# Patient Record
Sex: Male | Born: 1970 | Race: White | Hispanic: No | Marital: Single
Health system: Southern US, Academic
[De-identification: ages and names within clinical notes are randomized; demographics above are authoritative.]

## PROBLEM LIST (undated history)

## (undated) ENCOUNTER — Encounter

## (undated) ENCOUNTER — Ambulatory Visit: Payer: PRIVATE HEALTH INSURANCE

## (undated) ENCOUNTER — Telehealth

## (undated) ENCOUNTER — Ambulatory Visit

## (undated) ENCOUNTER — Ambulatory Visit
Payer: Medicaid (Managed Care) | Attending: Student in an Organized Health Care Education/Training Program | Primary: Student in an Organized Health Care Education/Training Program

## (undated) ENCOUNTER — Encounter
Attending: Student in an Organized Health Care Education/Training Program | Primary: Student in an Organized Health Care Education/Training Program

## (undated) ENCOUNTER — Ambulatory Visit: Payer: Medicaid (Managed Care)

## (undated) ENCOUNTER — Ambulatory Visit: Payer: MEDICAID

## (undated) ENCOUNTER — Encounter
Attending: Pharmacist Clinician (PhC)/ Clinical Pharmacy Specialist | Primary: Pharmacist Clinician (PhC)/ Clinical Pharmacy Specialist

## (undated) ENCOUNTER — Encounter: Attending: Infectious Disease | Primary: Infectious Disease

## (undated) ENCOUNTER — Ambulatory Visit: Attending: Infectious Disease | Primary: Infectious Disease

## (undated) DIAGNOSIS — I10 Essential (primary) hypertension: Secondary | ICD-10-CM

## (undated) DIAGNOSIS — B20 Human immunodeficiency virus [HIV] disease: Secondary | ICD-10-CM

## (undated) DIAGNOSIS — R002 Palpitations: Secondary | ICD-10-CM

## (undated) MED ORDER — FLUTICASONE PROPIONATE 50 MCG/ACTUATION NASAL SPRAY,SUSPENSION
Freq: Every day | NASAL | 0 days
Start: ? — End: 2020-01-05

---
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
## 2014-12-16 ENCOUNTER — Other Ambulatory Visit: Payer: Self-pay

## 2014-12-16 ENCOUNTER — Emergency Department
Admission: EM | Admit: 2014-12-16 | Discharge: 2014-12-16 | Disposition: A | Discharge status: Home / Self Care | Payer: Managed Care, Other (non HMO) | Attending: Emergency Medicine | Admitting: Emergency Medicine

## 2014-12-16 ENCOUNTER — Encounter: Payer: Self-pay | Admitting: Emergency Medicine

## 2014-12-16 DIAGNOSIS — Z720 Tobacco use: Secondary | ICD-10-CM | POA: Diagnosis not present

## 2014-12-16 DIAGNOSIS — R079 Chest pain, unspecified: Secondary | ICD-10-CM | POA: Diagnosis present

## 2014-12-16 DIAGNOSIS — Z21 Asymptomatic human immunodeficiency virus [HIV] infection status: Secondary | ICD-10-CM | POA: Insufficient documentation

## 2014-12-16 DIAGNOSIS — F419 Anxiety disorder, unspecified: Secondary | ICD-10-CM

## 2014-12-16 HISTORY — DX: Human immunodeficiency virus (HIV) disease: B20

## 2014-12-16 LAB — COMPREHENSIVE METABOLIC PANEL
ALBUMIN: 4.5 g/dL (ref 3.5–5.0)
ALT: 19 U/L (ref 17–63)
ANION GAP: 8 (ref 5–15)
AST: 27 U/L (ref 15–41)
Alkaline Phosphatase: 65 U/L (ref 38–126)
BILIRUBIN TOTAL: 0.6 mg/dL (ref 0.3–1.2)
BUN: 10 mg/dL (ref 6–20)
CHLORIDE: 103 mmol/L (ref 101–111)
CO2: 25 mmol/L (ref 22–32)
Calcium: 9.1 mg/dL (ref 8.9–10.3)
Creatinine, Ser: 1.28 mg/dL — ABNORMAL HIGH (ref 0.61–1.24)
Glucose, Bld: 105 mg/dL — ABNORMAL HIGH (ref 65–99)
Potassium: 4.1 mmol/L (ref 3.5–5.1)
SODIUM: 136 mmol/L (ref 135–145)
Total Protein: 7.7 g/dL (ref 6.5–8.1)

## 2014-12-16 LAB — CBC
HEMATOCRIT: 42.3 % (ref 40.0–52.0)
HEMOGLOBIN: 14.3 g/dL (ref 13.0–18.0)
MCH: 32 pg (ref 26.0–34.0)
MCHC: 33.8 g/dL (ref 32.0–36.0)
MCV: 94.7 fL (ref 80.0–100.0)
PLATELETS: 172 10*3/uL (ref 150–440)
RBC: 4.47 MIL/uL (ref 4.40–5.90)
RDW: 12.3 % (ref 11.5–14.5)
WBC: 5.7 10*3/uL (ref 3.8–10.6)

## 2014-12-16 LAB — TROPONIN I: Troponin I: <0.03 ng/mL (ref <0.031)

## 2014-12-16 MED ORDER — LORAZEPAM 0.5 MG PO TABS: 0.5000 mg | ORAL_TABLET | Freq: Four times a day (QID) | ORAL | Status: AC | PRN

## 2014-12-16 NOTE — ED Provider Notes (Signed)
[facility] Emergency Department Provider Note  Time seen: 5:36 PM  I have reviewed the triage vital signs and the nursing notes.   HISTORY  Chief Complaint Chest Pain    HPI Jose George is a 44 y.o. male with a past medical history of HIV (currently undetectable viral load) who presents the emergency department with approximately 24 hours of intermittent chest pain, blurred vision. According to the patient the symptoms began last night all he was at work. He noted feeling dizzy with some blurred vision, and then noted some dull chest pain. Patient states symptoms lasted approximately 20-30 minutes and then resolved. This morning patient had a similar episode again, again the symptoms resolved. This afternoon he had an additional episode so he came to the emergency department for evaluation. The patient states this has happened to him before in the past, which he has attributed more to stress/anxiety. He denies any chest pain or symptoms currently in the emergency department. He states his symptoms are mild when they do occur, but they were concerning to him so he came for evaluation. Denies any shortness of breath, nausea, diaphoresis. He cannot identify any modifying factors.    Past Medical History  Diagnosis Date  . HIV (human immunodeficiency virus infection)     There are no active problems to display for this patient.   History reviewed. No pertinent past surgical history.  No current outpatient prescriptions on file.  Allergies Review of patient's allergies indicates no known allergies.  No family history on file.  Social History History  Substance Use Topics  . Smoking status: Current Every Day Smoker -- 0.10 packs/day    Types: Cigarettes  . Smokeless tobacco: Not on file  . Alcohol Use: No    Review of Systems Constitutional: Negative for fever. Eyes: Intermittent blurred vision. Cardiovascular: Mild chest pain. Respiratory:  Negative for shortness of breath. Gastrointestinal: Negative for abdominal pain, vomiting and diarrhea. Musculoskeletal: Negative for back pain. Skin: Negative for rash. Neurological: Negative for headache  10-point ROS otherwise negative.  ____________________________________________   PHYSICAL EXAM:  VITAL SIGNS: ED Triage Vitals  Enc Vitals Group     BP 12/16/14 1511 119/63 mmHg     Pulse Rate 12/16/14 1511 86     Resp 12/16/14 1511 20     Temp 12/16/14 1511 98.5 F (36.9 C)     Temp Source 12/16/14 1511 Oral     SpO2 12/16/14 1511 95 %     Weight 12/16/14 1511 175 lb (79.379 kg)     Height 12/16/14 1511 5\' 11"  (1.803 m)     Head Cir --      Peak Flow --      Pain Score 12/16/14 1512 3     Pain Loc --      Pain Edu? --      Excl. in GC? --     Constitutional: Alert and oriented. Well appearing Eyes: Normal exam, 2 mm PERRL bilaterally ENT   Head: Normocephalic and atraumatic.   Mouth/Throat: Mucous membranes are moist. Cardiovascular: Normal rate, regular rhythm. No murmurs, rubs, or gallops. Respiratory: Normal respiratory effort without tachypnea nor retractions. Breath sounds are clear  Gastrointestinal: Soft and nontender. No distention.   Musculoskeletal: Nontender with normal range of motion in all extremities. No lower extremity tenderness or edema. Neurologic:  Normal speech and language. No gross focal neurologic deficits  Skin:  Skin is warm, dry and intact.  Psychiatric: Mood and affect are normal. Speech and  behavior are normal.   ____________________________________________    EKG  EKG reviewed and interpreted by myself shows normal sinus rhythm at 79 bpm, narrow QRS, normal axis, normal intervals, no ST changes noted. Overall normal EKG.  ____________________________________________    INITIAL IMPRESSION / ASSESSMENT AND PLAN / ED COURSE  Pertinent labs & imaging results that were available during my care of the patient were reviewed  by me and considered in my medical decision making (see chart for details).  Labs and EKG are normal. Patient denies any symptoms at this time. Patient's symptoms possibly due to stress/anxiety. I have offered the patient a head CT for further evaluation, the patient wishes to hold off for now. We will do a trial of Ativan at home as needed for his symptoms. He states if symptoms do not resolve with Ativan use, or they recur/worsen over the weekend he will return to the emergency department for further evaluation, otherwise he'll follow-up with his primary care doctor this week.  ____________________________________________   FINAL CLINICAL IMPRESSION(S) / ED DIAGNOSES  Chest pain   Minna Antis, MD 12/16/14 1740

## 2014-12-16 NOTE — Discharge Instructions (Signed)
Chest Pain (Nonspecific) °It is often hard to give a specific diagnosis for the cause of chest pain. There is always a chance that your pain could be related to something serious, such as a heart attack or a blood clot in the lungs. You need to follow up with your health care provider for further evaluation. °CAUSES  °· Heartburn. °· Pneumonia or bronchitis. °· Anxiety or stress. °· Inflammation around your heart (pericarditis) or lung (pleuritis or pleurisy). °· A blood clot in the lung. °· A collapsed lung (pneumothorax). It can develop suddenly on its own (spontaneous pneumothorax) or from trauma to the chest. °· Shingles infection (herpes zoster virus). °The chest wall is composed of bones, muscles, and cartilage. Any of these can be the source of the pain. °· The bones can be bruised by injury. °· The muscles or cartilage can be strained by coughing or overwork. °· The cartilage can be affected by inflammation and become sore (costochondritis). °DIAGNOSIS  °Lab tests or other studies may be needed to find the cause of your pain. Your health care provider may have you take a test called an ambulatory electrocardiogram (ECG). An ECG records your heartbeat patterns over a 24-hour period. You may also have other tests, such as: °· Transthoracic echocardiogram (TTE). During echocardiography, sound waves are used to evaluate how blood flows through your heart. °· Transesophageal echocardiogram (TEE). °· Cardiac monitoring. This allows your health care provider to monitor your heart rate and rhythm in real time. °· Holter monitor. This is a portable device that records your heartbeat and can help diagnose heart arrhythmias. It allows your health care provider to track your heart activity for several days, if needed. °· Stress tests by exercise or by giving medicine that makes the heart beat faster. °TREATMENT  °· Treatment depends on what may be causing your chest pain. Treatment may include: °· Acid blockers for  heartburn. °· Anti-inflammatory medicine. °· Pain medicine for inflammatory conditions. °· Antibiotics if an infection is present. °· You may be advised to change lifestyle habits. This includes stopping smoking and avoiding alcohol, caffeine, and chocolate. °· You may be advised to keep your head raised (elevated) when sleeping. This reduces the chance of acid going backward from your stomach into your esophagus. °Most of the time, nonspecific chest pain will improve within 2-3 days with rest and mild pain medicine.  °HOME CARE INSTRUCTIONS  °· If antibiotics were prescribed, take them as directed. Finish them even if you start to feel better. °· For the next few days, avoid physical activities that bring on chest pain. Continue physical activities as directed. °· Do not use any tobacco products, including cigarettes, chewing tobacco, or electronic cigarettes. °· Avoid drinking alcohol. °· Only take medicine as directed by your health care provider. °· Follow your health care provider's suggestions for further testing if your chest pain does not go away. °· Keep any follow-up appointments you made. If you do not go to an appointment, you could develop lasting (chronic) problems with pain. If there is any problem keeping an appointment, call to reschedule. °SEEK MEDICAL CARE IF:  °· Your chest pain does not go away, even after treatment. °· You have a rash with blisters on your chest. °· You have a fever. °SEEK IMMEDIATE MEDICAL CARE IF:  °· You have increased chest pain or pain that spreads to your arm, neck, jaw, back, or abdomen. °· You have shortness of breath. °· You have an increasing cough, or you cough   up blood.  You have severe back or abdominal pain.  You feel nauseous or vomit.  You have severe weakness.  You faint.  You have chills. This is an emergency. Do not wait to see if the pain will go away. Get medical help at once. Call your local emergency services (911 in [place] Do not drive  yourself to the hospital. MAKE SURE YOU:   Understand these instructions.  Will watch your condition.  Will get help right away if you are not doing well or get worse. Document Released: 04/02/2005 Document Revised: 06/28/2013 Document Reviewed: 01/27/2008 [facility] Patient Information 2015 [place], [place]. This information is not intended to replace advice given to you by your health care provider. Make sure you discuss any questions you have with your health care provider.  Panic Attacks Panic attacks are sudden, short-livedsurges of severe anxiety, fear, or discomfort. They may occur for no reason when you are relaxed, when you are anxious, or when you are sleeping. Panic attacks may occur for a number of reasons:   Healthy people occasionally have panic attacks in extreme, life-threatening situations, such as war or natural disasters. Normal anxiety is a protective mechanism of the body that helps [place] react to danger (fight or flight response).  Panic attacks are often seen with anxiety disorders, such as panic disorder, social anxiety disorder, generalized anxiety disorder, and phobias. Anxiety disorders cause excessive or uncontrollable anxiety. They may interfere with your relationships or other life activities.  Panic attacks are sometimes seen with other mental illnesses, such as depression and posttraumatic stress disorder.  Certain medical conditions, prescription medicines, and drugs of abuse can cause panic attacks. SYMPTOMS  Panic attacks start suddenly, peak within 20 minutes, and are accompanied by four or more of the following symptoms:  Pounding heart or fast heart rate (palpitations).  Sweating.  Trembling or shaking.  Shortness of breath or feeling smothered.  Feeling choked.  Chest pain or discomfort.  Nausea or strange feeling in your stomach.  Dizziness, light-headedness, or feeling like you will faint.  Chills or hot flushes.  Numbness or tingling in  your lips or hands and feet.  Feeling that things are not real or feeling that you are not yourself.  Fear of losing control or going crazy.  Fear of dying. Some of these symptoms can mimic serious medical conditions. For example, you may think you are having a heart attack. Although panic attacks can be very scary, they are not life threatening. DIAGNOSIS  Panic attacks are diagnosed through an assessment by your health care provider. Your health care provider will ask questions about your symptoms, such as where and when they occurred. Your health care provider will also ask about your medical history and use of alcohol and drugs, including prescription medicines. Your health care provider may order blood tests or other studies to rule out a serious medical condition. Your health care provider may refer you to a mental health professional for further evaluation. TREATMENT   Most healthy people who have one or two panic attacks in an extreme, life-threatening situation will not require treatment.  The treatment for panic attacks associated with anxiety disorders or other mental illness typically involves counseling with a mental health professional, medicine, or a combination of both. Your health care provider will help determine what treatment is best for you.  Panic attacks due to physical illness usually go away with treatment of the illness. If prescription medicine is causing panic attacks, talk with your health care  provider about stopping the medicine, decreasing the dose, or substituting another medicine.  Panic attacks due to alcohol or drug abuse go away with abstinence. Some adults need professional help in order to stop drinking or using drugs. HOME CARE INSTRUCTIONS   Take all medicines as directed by your health care provider.   Schedule and attend follow-up visits as directed by your health care provider. It is important to keep all your appointments. SEEK MEDICAL CARE  IF:  You are not able to take your medicines as prescribed.  Your symptoms do not improve or get worse. SEEK IMMEDIATE MEDICAL CARE IF:   You experience panic attack symptoms that are different than your usual symptoms.  You have serious thoughts about hurting yourself or others.  You are taking medicine for panic attacks and have a serious side effect. MAKE SURE YOU:  Understand these instructions.  Will watch your condition.  Will get help right away if you are not doing well or get worse. Document Released: 06/23/2005 Document Revised: 06/28/2013 Document Reviewed: 02/04/2013 [facility] Patient Information 2015 [place], [place]. This information is not intended to replace advice given to you by your health care provider. Make sure you discuss any questions you have with your health care provider.     You have been seen in the emergency department for intermittent blurred vision and chest pain. As we have discussed your workup today is largely normal. Please follow-up with your primary care doctor says possible regarding today's emergency department visit any current symptoms. Return to the emergency department for any worsening symptoms, or any other personally concerning symptoms.

## 2014-12-16 NOTE — ED Notes (Signed)
Felt chest pain last night, relieved after rest, today again chest pain and blurred vision, L sided.

## 2016-09-04 ENCOUNTER — Emergency Department: Payer: Managed Care, Other (non HMO)

## 2016-09-04 ENCOUNTER — Emergency Department
Admission: EM | Admit: 2016-09-04 | Discharge: 2016-09-04 | Disposition: A | Discharge status: Home / Self Care | Payer: Managed Care, Other (non HMO) | Attending: Emergency Medicine | Admitting: Emergency Medicine

## 2016-09-04 DIAGNOSIS — F17210 Nicotine dependence, cigarettes, uncomplicated: Secondary | ICD-10-CM | POA: Insufficient documentation

## 2016-09-04 DIAGNOSIS — Z21 Asymptomatic human immunodeficiency virus [HIV] infection status: Secondary | ICD-10-CM | POA: Insufficient documentation

## 2016-09-04 DIAGNOSIS — R519 Headache, unspecified: Secondary | ICD-10-CM

## 2016-09-04 DIAGNOSIS — R51 Headache: Secondary | ICD-10-CM | POA: Insufficient documentation

## 2016-09-04 DIAGNOSIS — H538 Other visual disturbances: Secondary | ICD-10-CM | POA: Insufficient documentation

## 2016-09-04 LAB — COMPREHENSIVE METABOLIC PANEL
ALBUMIN: 4.7 g/dL (ref 3.5–5.0)
ALK PHOS: 58 U/L (ref 38–126)
ALT: 18 U/L (ref 17–63)
AST: 28 U/L (ref 15–41)
Anion gap: 10 (ref 5–15)
BILIRUBIN TOTAL: 0.5 mg/dL (ref 0.3–1.2)
BUN: 8 mg/dL (ref 6–20)
CALCIUM: 9.4 mg/dL (ref 8.9–10.3)
CO2: 30 mmol/L (ref 22–32)
Chloride: 101 mmol/L (ref 101–111)
Creatinine, Ser: 1.4 mg/dL — ABNORMAL HIGH (ref 0.61–1.24)
GFR calc Af Amer: >60 mL/min (ref >60)
GFR calc non Af Amer: 60 mL/min — ABNORMAL LOW (ref >60)
Glucose, Bld: 128 mg/dL — ABNORMAL HIGH (ref 65–99)
Potassium: 4.1 mmol/L (ref 3.5–5.1)
Sodium: 141 mmol/L (ref 135–145)
TOTAL PROTEIN: 8 g/dL (ref 6.5–8.1)

## 2016-09-04 LAB — CBC WITH DIFFERENTIAL/PLATELET
BASOS ABS: 0.1 10*3/uL (ref 0–0.1)
BASOS PCT: 1 %
Eosinophils Absolute: 0.4 10*3/uL (ref 0–0.7)
Eosinophils Relative: 5 %
HEMATOCRIT: 43 % (ref 40.0–52.0)
HEMOGLOBIN: 14.8 g/dL (ref 13.0–18.0)
LYMPHS PCT: 40 %
Lymphs Abs: 3.1 10*3/uL (ref 1.0–3.6)
MCH: 32.5 pg (ref 26.0–34.0)
MCHC: 34.4 g/dL (ref 32.0–36.0)
MCV: 94.3 fL (ref 80.0–100.0)
Monocytes Absolute: 0.5 10*3/uL (ref 0.2–1.0)
Monocytes Relative: 7 %
NEUTROS PCT: 47 %
Neutro Abs: 3.8 10*3/uL (ref 1.4–6.5)
Platelets: 200 10*3/uL (ref 150–440)
RBC: 4.56 MIL/uL (ref 4.40–5.90)
RDW: 13.3 % (ref 11.5–14.5)
WBC: 7.8 10*3/uL (ref 3.8–10.6)

## 2016-09-04 LAB — GLUCOSE, CAPILLARY: Glucose-Capillary: 114 mg/dL — ABNORMAL HIGH (ref 65–99)

## 2016-09-04 MED ORDER — METOCLOPRAMIDE HCL 5 MG/ML IJ SOLN
10.0000 mg | Freq: Once | INTRAMUSCULAR | Status: AC
  Administered 2016-09-04: 10 mg via INTRAVENOUS
  Filled 2016-09-04: qty 2

## 2016-09-04 MED ORDER — KETOROLAC TROMETHAMINE 30 MG/ML IJ SOLN
30.0000 mg | Freq: Once | INTRAMUSCULAR | Status: AC
  Administered 2016-09-04: 30 mg via INTRAVENOUS
  Filled 2016-09-04: qty 1

## 2016-09-04 MED ORDER — SODIUM CHLORIDE 0.9 % IV SOLN
1000.0000 mL | Freq: Once | INTRAVENOUS | Status: AC
  Administered 2016-09-04: 1000 mL via INTRAVENOUS

## 2016-09-04 MED ORDER — BUTALBITAL-APAP-CAFFEINE 50-325-40 MG PO TABS: 1.0000 | ORAL_TABLET | Freq: Four times a day (QID) | ORAL | 0 refills | Status: AC | PRN

## 2016-09-04 MED ORDER — DIPHENHYDRAMINE HCL 50 MG/ML IJ SOLN
25.0000 mg | Freq: Once | INTRAMUSCULAR | Status: AC
  Administered 2016-09-04: 25 mg via INTRAVENOUS
  Filled 2016-09-04: qty 1

## 2016-09-04 NOTE — ED Provider Notes (Signed)
[facility] Emergency Department Provider Note        Time seen: ----------------------------------------- 7:52 PM on 09/04/2016 -----------------------------------------    I have reviewed the triage vital signs and the nursing notes.   HISTORY  Chief Complaint Migraine    HPI Jose George is a 46 y.o. male who presents ER with headache and intermittent episodes of blurry vision. Patient has history of migraines but states the pain is not as intense. Patient also feels weak and dizzy since the episode started around week ago. Nothing makes his symptoms better or worse. Pain is currently 8 out of 10 in the back of his head, he denies light or sound sensitivity. He denies vomiting but has had nausea.   Past Medical History:  Diagnosis Date  . HIV (human immunodeficiency virus infection)     There are no active problems to display for this patient.   No past surgical history on file.  Allergies Patient has no known allergies.  Social History Social History  Substance Use Topics  . Smoking status: Current Every Day Smoker    Packs/day: 0.10    Types: Cigarettes  . Smokeless tobacco: Not on file  . Alcohol use No    Review of Systems Constitutional: Negative for fever. Eyes: Positive for blurry vision Cardiovascular: Negative for chest pain. Respiratory: Negative for shortness of breath. Gastrointestinal: Negative for abdominal pain, positive for nausea Genitourinary: Negative for dysuria. Musculoskeletal: Negative for back pain. Skin: Negative for rash. Neurological: Positive for headache, weakness  10-point ROS otherwise negative.  ____________________________________________   PHYSICAL EXAM:  VITAL SIGNS: ED Triage Vitals  Enc Vitals Group     BP 09/04/16 1934 (!) 152/91     Pulse Rate 09/04/16 1934 99     Resp 09/04/16 1934 20     Temp 09/04/16 1934 99.4 F (37.4 C)     Temp Source 09/04/16 1934 Oral     SpO2  09/04/16 1934 98 %     Weight 09/04/16 1935 190 lb (86.2 kg)     Height 09/04/16 1935 5\' 11"  (1.803 m)     Head Circumference --      Peak Flow --      Pain Score 09/04/16 1935 8     Pain Loc --      Pain Edu? --      Excl. in GC? --     Constitutional: Alert and oriented. Well appearing and in no distress. Eyes: Conjunctivae are normal. PERRL. Normal extraocular movements.No photophobia ENT   Head: Normocephalic and atraumatic.   Nose: No congestion/rhinnorhea.   Mouth/Throat: Mucous membranes are moist.   Neck: No stridor.No meningeal signs Cardiovascular: Normal rate, regular rhythm. No murmurs, rubs, or gallops. Respiratory: Normal respiratory effort without tachypnea nor retractions. Breath sounds are clear and equal bilaterally. No wheezes/rales/rhonchi. Gastrointestinal: Soft and nontender. Normal bowel sounds Musculoskeletal: Nontender with normal range of motion in all extremities. No lower extremity tenderness nor edema. Neurologic:  Normal speech and language. No gross focal neurologic deficits are appreciated. Strength, sensation, cranial nerves appear to be normal Skin:  Skin is warm, dry and intact. No rash noted. Psychiatric: Mood and affect are normal. Speech and behavior are normal.  ___________________________________________  ED COURSE:  Pertinent labs & imaging results that were available during my care of the patient were reviewed by me and considered in my medical decision making (see chart for details). Patient presents to ER for headache of uncertain etiology. We will assess with labs and  imaging.   Procedures ____________________________________________   LABS (pertinent positives/negatives)  Labs Reviewed  COMPREHENSIVE METABOLIC PANEL - Abnormal; Notable for the following:       Result Value   Glucose, Bld 128 (*)    Creatinine, Ser 1.40 (*)    GFR calc non Af Amer 60 (*)    All other components within normal limits  GLUCOSE,  CAPILLARY - Abnormal; Notable for the following:    Glucose-Capillary 114 (*)    All other components within normal limits  CBC WITH DIFFERENTIAL/PLATELET  CBG MONITORING, ED    RADIOLOGY  CT head IMPRESSION: Unremarkable noncontrast head CT. ____________________________________________  FINAL ASSESSMENT AND PLAN  Headache  Plan: Patient with labs and imaging as dictated above. Patient presented to the ER with headache of uncertain etiology. His labs and imaging are reassuring. Patient stated his headache had resolved. He is stable for outpatient follow-up.   Emily FilbertWilliams, [place] Jeff E, MD   Note: This note was generated in part or whole with voice recognition software. Voice recognition is usually quite accurate but there are transcription errors that can and very often do occur. I apologize for any typographical errors that were not detected and corrected.     Emily FilbertJonathan E Forrester Blando, MD 09/04/16 2215

## 2016-09-04 NOTE — ED Triage Notes (Signed)
Pt in with co headache and intermittent episodes of blurry vision. Hx of migraines but pain is not as intense. States feels week and dizzy since episodes started a week ago.

## 2017-01-05 ENCOUNTER — Ambulatory Visit: Admission: RE | Admit: 2017-01-05 | Discharge: 2017-01-05 | Disposition: A | Discharge status: Home / Self Care

## 2017-01-05 DIAGNOSIS — Z113 Encounter for screening for infections with a predominantly sexual mode of transmission: Principal | ICD-10-CM

## 2017-01-08 ENCOUNTER — Ambulatory Visit
Admission: RE | Admit: 2017-01-08 | Discharge: 2017-01-08 | Disposition: A | Discharge status: Home / Self Care | Attending: Infectious Disease | Admitting: Infectious Disease

## 2017-01-08 DIAGNOSIS — K6282 Dysplasia of anus: Principal | ICD-10-CM

## 2017-01-15 MED ORDER — ELVITEG 150 MG-COB 150 MG-EMTRICIT 200 MG-TENOFO ALAFENAM 10 MG TABLET
ORAL_TABLET | Freq: Every day | ORAL | 6 refills | 0.00000 days | Status: CP
Start: 2017-01-15 — End: 2017-05-20

## 2017-05-20 ENCOUNTER — Ambulatory Visit
Admission: RE | Admit: 2017-05-20 | Discharge: 2017-05-20 | Disposition: A | Discharge status: Home / Self Care | Attending: Infectious Disease

## 2017-05-20 DIAGNOSIS — K6282 Dysplasia of anus: Principal | ICD-10-CM

## 2017-05-20 DIAGNOSIS — B20 Human immunodeficiency virus [HIV] disease: Secondary | ICD-10-CM

## 2017-05-20 MED ORDER — ELVITEG 150 MG-COB 150 MG-EMTRICIT 200 MG-TENOFO ALAFENAM 10 MG TABLET
ORAL_TABLET | Freq: Every day | ORAL | 11 refills | 0 days | Status: CP
Start: 2017-05-20 — End: 2017-12-24

## 2017-09-24 ENCOUNTER — Emergency Department (HOSPITAL_BASED_OUTPATIENT_CLINIC_OR_DEPARTMENT_OTHER): Payer: Managed Care, Other (non HMO)

## 2017-09-24 ENCOUNTER — Other Ambulatory Visit: Payer: Self-pay

## 2017-09-24 ENCOUNTER — Emergency Department (HOSPITAL_BASED_OUTPATIENT_CLINIC_OR_DEPARTMENT_OTHER)
Admission: EM | Admit: 2017-09-24 | Discharge: 2017-09-24 | Disposition: A | Discharge status: Home / Self Care | Payer: Managed Care, Other (non HMO) | Attending: Emergency Medicine | Admitting: Emergency Medicine

## 2017-09-24 ENCOUNTER — Encounter (HOSPITAL_BASED_OUTPATIENT_CLINIC_OR_DEPARTMENT_OTHER): Payer: Self-pay | Admitting: Emergency Medicine

## 2017-09-24 DIAGNOSIS — R0789 Other chest pain: Secondary | ICD-10-CM | POA: Insufficient documentation

## 2017-09-24 DIAGNOSIS — B20 Human immunodeficiency virus [HIV] disease: Secondary | ICD-10-CM | POA: Insufficient documentation

## 2017-09-24 DIAGNOSIS — F17210 Nicotine dependence, cigarettes, uncomplicated: Secondary | ICD-10-CM | POA: Insufficient documentation

## 2017-09-24 LAB — BASIC METABOLIC PANEL
Anion gap: 10 (ref 5–15)
BUN: 9 mg/dL (ref 6–20)
CHLORIDE: 102 mmol/L (ref 101–111)
CO2: 23 mmol/L (ref 22–32)
CREATININE: 1.28 mg/dL — AB (ref 0.61–1.24)
Calcium: 9.3 mg/dL (ref 8.9–10.3)
GFR calc non Af Amer: >60 mL/min (ref >60)
Glucose, Bld: 105 mg/dL — ABNORMAL HIGH (ref 65–99)
Potassium: 4.1 mmol/L (ref 3.5–5.1)
Sodium: 135 mmol/L (ref 135–145)

## 2017-09-24 LAB — CBC
HCT: 42.8 % (ref 39.0–52.0)
HEMOGLOBIN: 14.5 g/dL (ref 13.0–17.0)
MCH: 32.3 pg (ref 26.0–34.0)
MCHC: 33.9 g/dL (ref 30.0–36.0)
MCV: 95.3 fL (ref 78.0–100.0)
Platelets: 207 10*3/uL (ref 150–400)
RBC: 4.49 MIL/uL (ref 4.22–5.81)
RDW: 12.5 % (ref 11.5–15.5)
WBC: 7.2 10*3/uL (ref 4.0–10.5)

## 2017-09-24 LAB — TROPONIN I: Troponin I: <0.03 ng/mL (ref <0.03)

## 2017-09-24 NOTE — ED Provider Notes (Signed)
See epic downtime documentation.   Jose George, Jonny RuizJohn, MD 09/24/17 [phone]

## 2017-09-24 NOTE — ED Triage Notes (Signed)
Patient states that she has had "mild" chest pain to his left chest and down his left arm for the last couple of weeks. He had a health screening at work tonight and was found to have increased BP and cholesterol and sent her for further evaluation

## 2017-12-23 ENCOUNTER — Ambulatory Visit: Admit: 2017-12-23 | Discharge: 2017-12-24

## 2017-12-23 DIAGNOSIS — Z720 Tobacco use: Secondary | ICD-10-CM

## 2017-12-23 DIAGNOSIS — B20 Human immunodeficiency virus [HIV] disease: Principal | ICD-10-CM

## 2017-12-24 MED ORDER — ELVITEG 150 MG-COB 150 MG-EMTRICIT 200 MG-TENOFO ALAFENAM 10 MG TABLET
ORAL_TABLET | Freq: Every day | ORAL | 11 refills | 0 days | Status: CP
Start: 2017-12-24 — End: 2018-06-23

## 2017-12-25 DIAGNOSIS — F17200 Nicotine dependence, unspecified, uncomplicated: Secondary | ICD-10-CM | POA: Insufficient documentation

## 2018-01-26 ENCOUNTER — Other Ambulatory Visit: Payer: Self-pay

## 2018-01-26 ENCOUNTER — Emergency Department
Admission: EM | Admit: 2018-01-26 | Discharge: 2018-01-26 | Disposition: A | Discharge status: Home / Self Care | Payer: BLUE CROSS/BLUE SHIELD | Attending: Emergency Medicine | Admitting: Emergency Medicine

## 2018-01-26 ENCOUNTER — Encounter: Payer: Self-pay | Admitting: Emergency Medicine

## 2018-01-26 ENCOUNTER — Emergency Department: Payer: BLUE CROSS/BLUE SHIELD

## 2018-01-26 DIAGNOSIS — F17210 Nicotine dependence, cigarettes, uncomplicated: Secondary | ICD-10-CM | POA: Diagnosis not present

## 2018-01-26 DIAGNOSIS — J0100 Acute maxillary sinusitis, unspecified: Secondary | ICD-10-CM | POA: Diagnosis not present

## 2018-01-26 DIAGNOSIS — R0789 Other chest pain: Secondary | ICD-10-CM

## 2018-01-26 DIAGNOSIS — Z79899 Other long term (current) drug therapy: Secondary | ICD-10-CM | POA: Insufficient documentation

## 2018-01-26 LAB — TROPONIN I

## 2018-01-26 LAB — COMPREHENSIVE METABOLIC PANEL
ALK PHOS: 65 U/L (ref 38–126)
ALT: 17 U/L (ref 0–44)
AST: 22 U/L (ref 15–41)
Albumin: 4.7 g/dL (ref 3.5–5.0)
Anion gap: 5 (ref 5–15)
BUN: 12 mg/dL (ref 6–20)
CALCIUM: 9.2 mg/dL (ref 8.9–10.3)
CHLORIDE: 103 mmol/L (ref 98–111)
CO2: 30 mmol/L (ref 22–32)
CREATININE: 1.31 mg/dL — AB (ref 0.61–1.24)
GFR calc Af Amer: >60 mL/min (ref >60)
GFR calc non Af Amer: >60 mL/min (ref >60)
Glucose, Bld: 101 mg/dL — ABNORMAL HIGH (ref 70–99)
Potassium: 4.2 mmol/L (ref 3.5–5.1)
Sodium: 138 mmol/L (ref 135–145)
Total Bilirubin: 0.6 mg/dL (ref 0.3–1.2)
Total Protein: 7.9 g/dL (ref 6.5–8.1)

## 2018-01-26 LAB — CBC
HEMATOCRIT: 40.3 % (ref 40.0–52.0)
HEMOGLOBIN: 13.9 g/dL (ref 13.0–18.0)
MCH: 33.3 pg (ref 26.0–34.0)
MCHC: 34.5 g/dL (ref 32.0–36.0)
MCV: 96.4 fL (ref 80.0–100.0)
PLATELETS: 202 10*3/uL (ref 150–440)
RBC: 4.18 MIL/uL — AB (ref 4.40–5.90)
RDW: 12.8 % (ref 11.5–14.5)
WBC: 8.6 10*3/uL (ref 3.8–10.6)

## 2018-01-26 MED ORDER — AMOXICILLIN-POT CLAVULANATE 875-125 MG PO TABS: 1.0000 | ORAL_TABLET | Freq: Two times a day (BID) | ORAL | 0 refills | Status: DC

## 2018-01-26 MED ORDER — AMOXICILLIN-POT CLAVULANATE 875-125 MG PO TABS: 1.0000 | ORAL_TABLET | Freq: Two times a day (BID) | ORAL | 0 refills | Status: AC

## 2018-01-26 NOTE — ED Triage Notes (Addendum)
States about 10 days ago while at work pulled a tick off of left leg (Thursday night).  Saturday woke up to sinus congestion and cough, and on Sunday noticed several welts to left leg.  Sinus symptoms continued, took "a few" amoxicillin that a friend had left over.  Patient states symptoms seemed to improve, but symptoms returned and welts continued.  States last night felt chest tightness and headache.   Patient also c/o intermittent dizziness.  Patient is AAOx3.  Skin warm and dry. NAD

## 2018-01-26 NOTE — ED Provider Notes (Signed)
[facility] Emergency Department Provider Note   ____________________________________________    I have reviewed the triage vital signs and the nursing notes.   HISTORY  Chief Complaint Chest Pain and Headache     HPI Jose George is a 47 y.o. male with a history of HIV well-controlled with medications who presents with complaints of sinus pressure, chest discomfort/tightness that started last night and bug bites now mostly resolved.  Patient notes over the last couple of weeks he has had sinus pressure which seems to have worsened.  He has taken some leftover amoxicillin with little improvement.  He also noted a tick on him 2 weeks ago but was able to flick it off but subsequently noted multiple bug bites on his legs.  No fevers or chills.  No shortness of breath or cough.  Describes central chest tightness last night at work which made him concerned and wanted to come get checked out.  No history of coronary artery disease, he does smoke cigarettes  Past Medical History:  Diagnosis Date  . HIV (human immunodeficiency virus infection) (HCC)     There are no active problems to display for this patient.   History reviewed. No pertinent surgical history.  Prior to Admission medications   Medication Sig Start Date End Date Taking? Authorizing Provider  elvitegravir-cobicistat-emtricitabine-tenofovir (GENVOYA) 150-150-200-10 MG TABS tablet Take 1 tablet by mouth daily with breakfast.   Yes [provider]  amoxicillin-clavulanate (AUGMENTIN) 875-125 MG tablet Take 1 tablet by mouth 2 (two) times daily for 7 days. 01/26/18 02/02/18  Jene Every, MD     Allergies Patient has no known allergies.  No family history on file.  Social History Social History   Tobacco Use  . Smoking status: Current Every Day Smoker    Packs/day: 0.10    Types: Cigarettes  . Smokeless tobacco: Never Used  Substance Use Topics  . Alcohol use: No  . Drug  use: Not on file    Review of Systems  Constitutional: No fever/chills Eyes: No visual changes.  ENT: Sinus pressure Cardiovascular: As above Respiratory: Denies shortness of breath. Gastrointestinal: No abdominal pain.  No nausea, no vomiting.   Genitourinary: Negative for dysuria. Musculoskeletal: Negative for back pain. Skin: As above Neurological: Negative for headaches    ____________________________________________   PHYSICAL EXAM:  VITAL SIGNS: ED Triage Vitals  Enc Vitals Group     BP 01/26/18 0728 (!) 145/86     Pulse Rate 01/26/18 0728 85     Resp 01/26/18 0728 20     Temp 01/26/18 0728 98.4 F (36.9 C)     Temp Source 01/26/18 0728 Oral     SpO2 01/26/18 0728 97 %     Weight 01/26/18 0729 80.3 kg (177 lb)     Height 01/26/18 0729 1.803 m (5\' 11" )     Head Circumference --      Peak Flow --      Pain Score 01/26/18 0734 7     Pain Loc --      Pain Edu? --      Excl. in GC? --     Constitutional: Alert and oriented. No acute distress. Pleasant and interactive Eyes: Conjunctivae are normal.  . Nose: No congestion/rhinnorhea. Mouth/Throat: Mucous membranes are moist.    Cardiovascular: Normal rate, regular rhythm. Grossly normal heart sounds.  Good peripheral circulation. Respiratory: Normal respiratory effort.  No retractions. Lungs CTAB. Gastrointestinal: Soft and nontender. No distention.    Musculoskeletal: No  lower extremity tenderness nor edema.  Warm and well perfused Neurologic:  Normal speech and language. No gross focal neurologic deficits are appreciated.  Skin:  Skin is warm, dry and intact.  Healing bug bites noted to the left leg Psychiatric: Mood and affect are normal. Speech and behavior are normal.  ____________________________________________   LABS (all labs ordered are listed, but only abnormal results are displayed)  Labs Reviewed  CBC - Abnormal; Notable for the following components:      Result Value   RBC 4.18 (*)    All  other components within normal limits  COMPREHENSIVE METABOLIC PANEL - Abnormal; Notable for the following components:   Glucose, Bld 101 (*)    Creatinine, Ser 1.31 (*)    All other components within normal limits  TROPONIN I   ____________________________________________  EKG  ED ECG REPORT I, Jene Everyobert Shallyn Constancio, the attending physician, personally viewed and interpreted this ECG.  Date: 01/26/2018  Rhythm: normal sinus rhythm QRS Axis: normal Intervals: normal ST/T Wave abnormalities: normal Narrative Interpretation: no evidence of acute ischemia  ____________________________________________  RADIOLOGY  Chest x-ray unremarkable ____________________________________________   PROCEDURES  Procedure(s) performed: No  Procedures   Critical Care performed: No ____________________________________________   INITIAL IMPRESSION / ASSESSMENT AND PLAN / ED COURSE  Pertinent labs & imaging results that were available during my care of the patient were reviewed by me and considered in my medical decision making (see chart for details).  Patient presents with multiple complaints as above, most concerning is the description of chest tightness last night which is now resolved.  EKG is quite reassuring, will check troponin, chest x-ray and reevaluate.  Lab work is benign.  Chest x-ray normal.  Recommend increased fluid intake, will start Augmentin for sinusitis, outpatient follow-up    ____________________________________________   FINAL CLINICAL IMPRESSION(S) / ED DIAGNOSES  Final diagnoses:  Atypical chest pain  Acute maxillary sinusitis, recurrence not specified        Note:  This document was prepared using Dragon voice recognition software and may include unintentional dictation errors.    Jene EveryKinner, Akiel, MD 01/26/18 1425

## 2018-03-08 IMAGING — CT CT HEAD W/O CM
3 series · 15 of 47 positions shown, 18 images · non-contrast
Comparison: None.

CLINICAL DATA: Headache, intermittent blurry vision.

EXAM:
CT HEAD WITHOUT CONTRAST
TECHNIQUE: Contiguous axial images were obtained from the base of the skull
through the vertex without intravenous contrast.

[Series 2: head wo · axial · 0.45mm/px · z∈[-106,+19]mm · 9 of 30 slices shown, 12 images]
[im 3/30  brain]
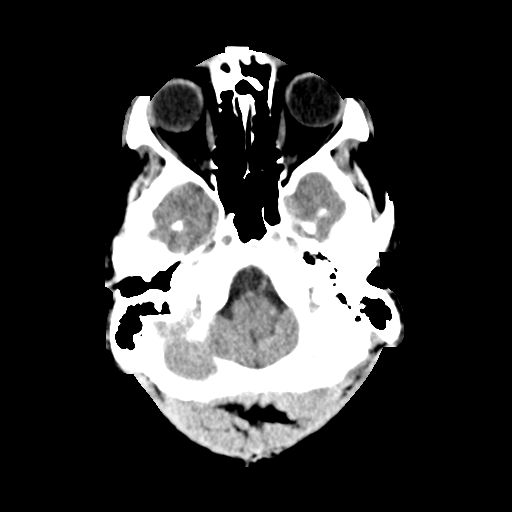
[im 3/30  bone]
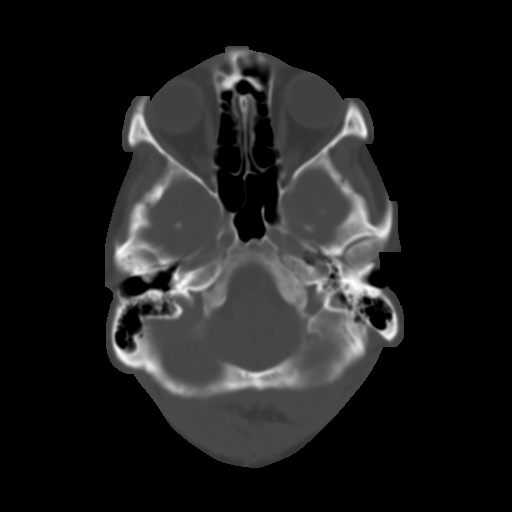
[im 6/30  brain]
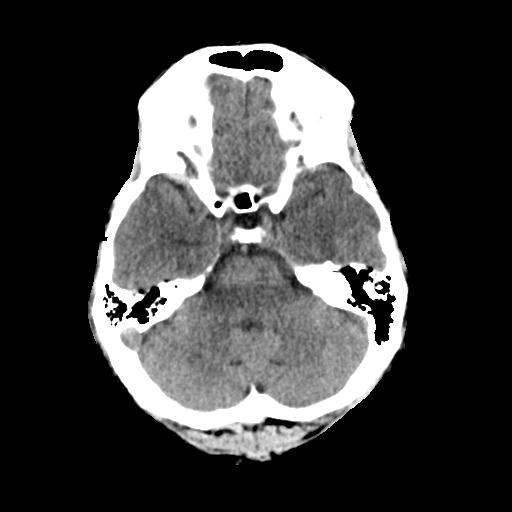
[im 9/30  brain]
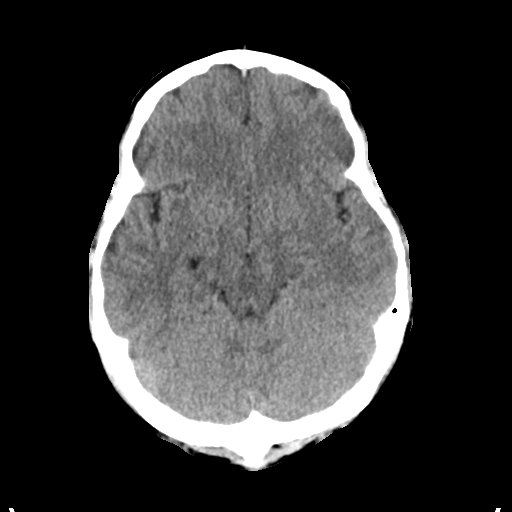
[im 12/30  brain]
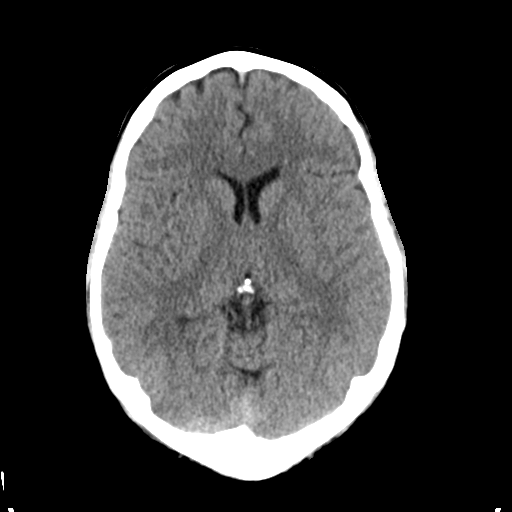
[im 16/30  brain]
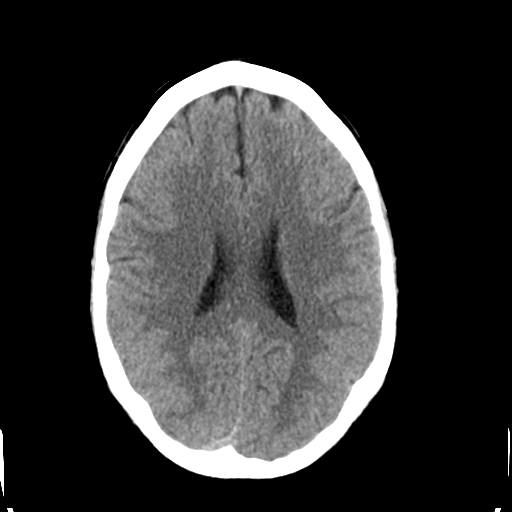
[im 16/30  bone]
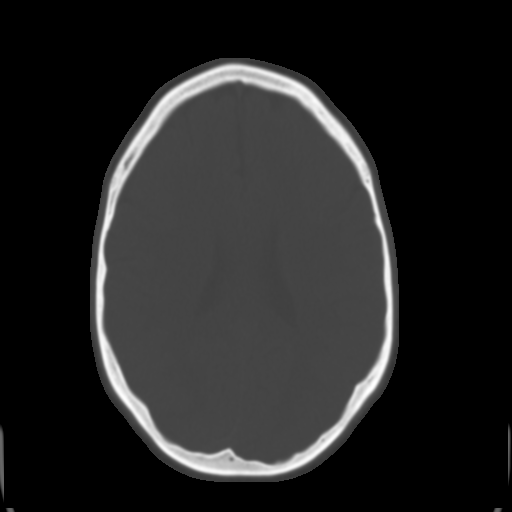
[im 19/30  brain]
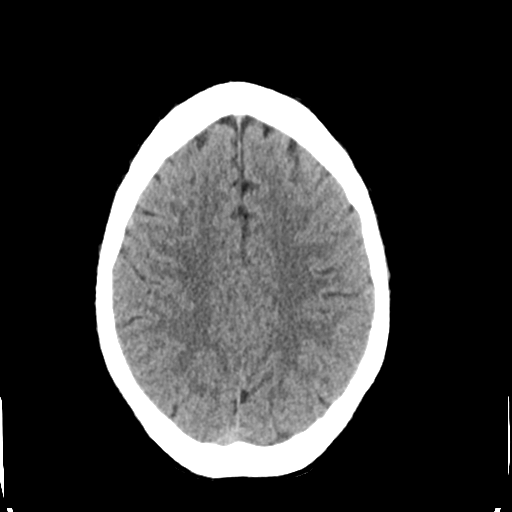
[im 22/30  brain]
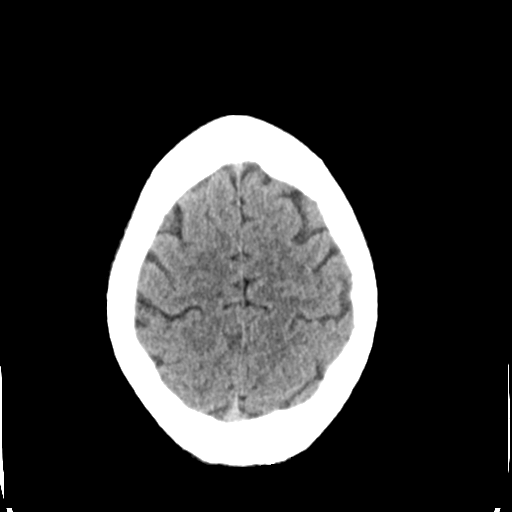
[im 25/30  brain]
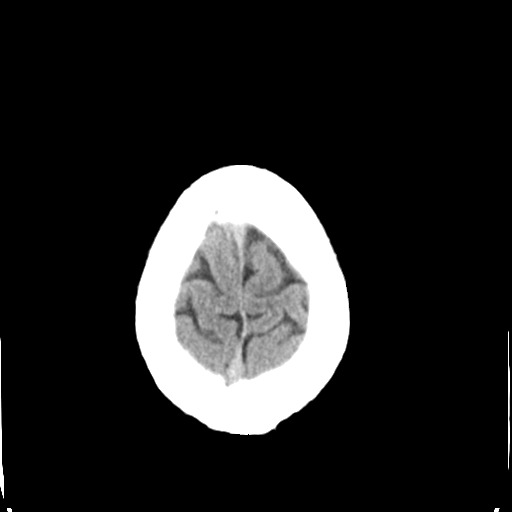
[im 28/30  brain]
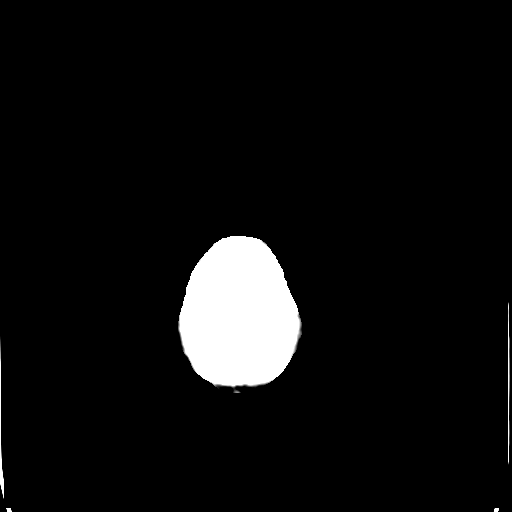
[im 28/30  bone]
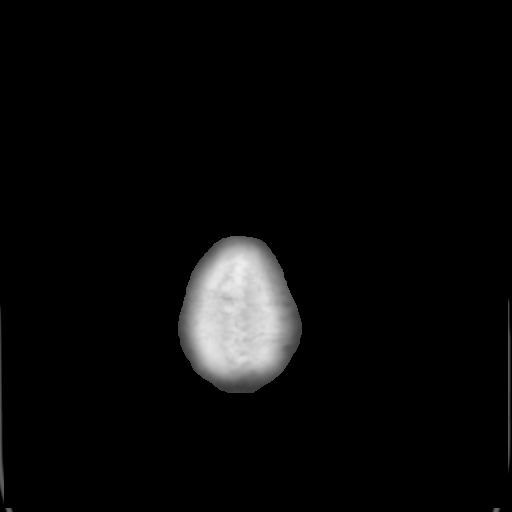

[Series 4: coronal soft tissue · coronal · 0.30mm/px · 3 of 63 slices shown]
[im 21/63  brain]
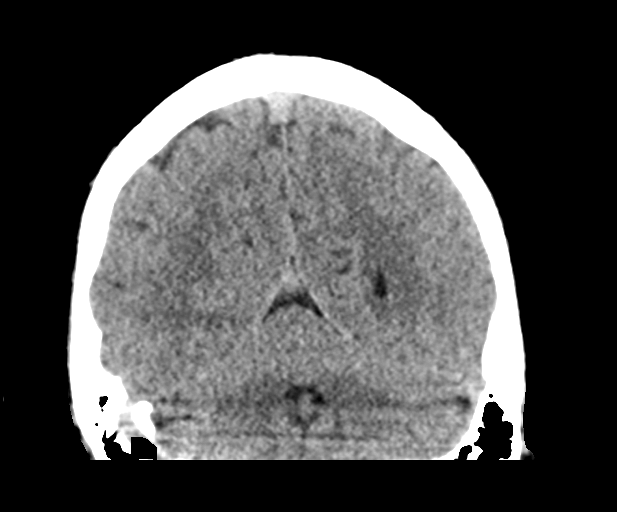
[im 28/63  brain]
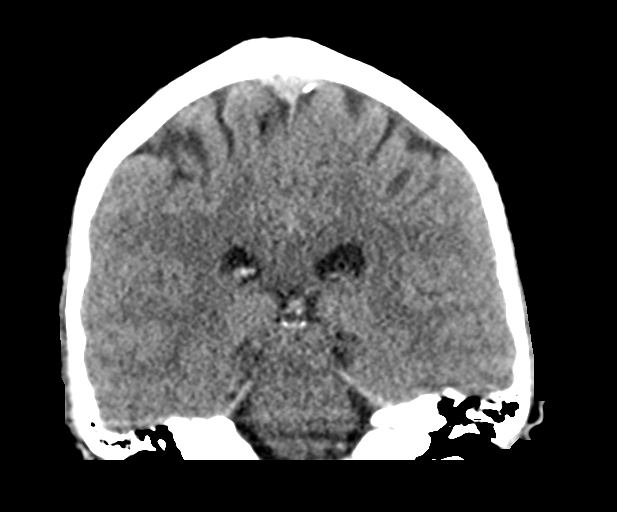
[im 35/63  brain]
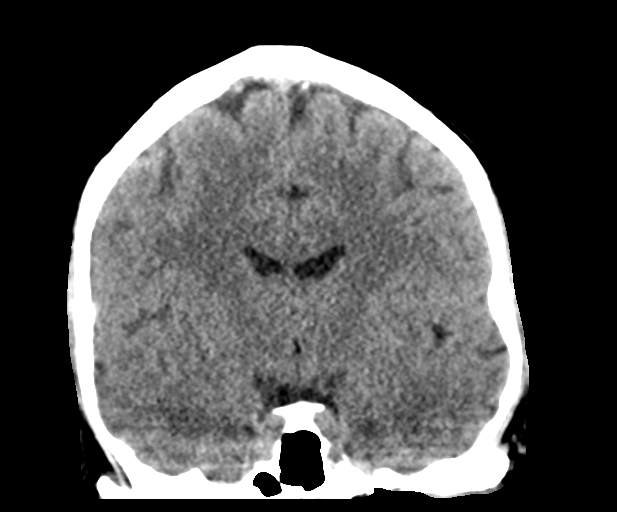

[Series 5: sagittal soft tissue · sagittal · 0.31mm/px · 3 of 49 slices shown]
[im 17/49  brain]
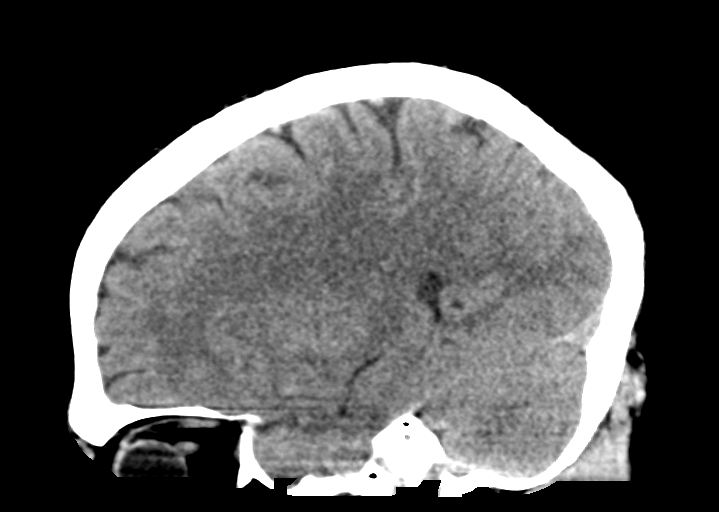
[im 25/49  brain]
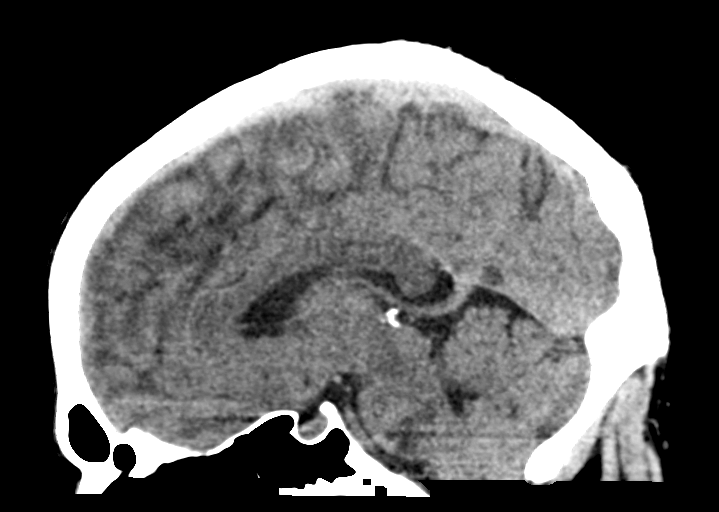
[im 33/49  brain]
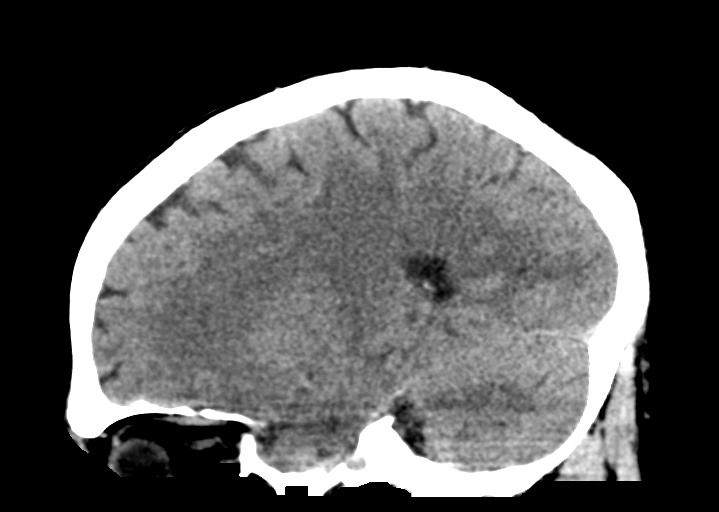

[15 of 47 positions shown; findings below may reference images not displayed]

FINDINGS: Brain: No evidence of acute infarction, hemorrhage, hydrocephalus,
extra-axial collection or mass lesion/mass effect.

Vascular: No hyperdense vessel or unexpected calcification.

Skull: Normal. Negative for fracture or focal lesion.

Sinuses/Orbits: Paranasal sinuses and mastoid air cells are clear.
The visualized orbits are unremarkable.

Other: None.
IMPRESSION: Unremarkable noncontrast head CT.

## 2018-05-06 ENCOUNTER — Encounter: Payer: Self-pay | Admitting: Emergency Medicine

## 2018-05-06 ENCOUNTER — Emergency Department
Admission: EM | Admit: 2018-05-06 | Discharge: 2018-05-06 | Disposition: A | Discharge status: Home / Self Care | Payer: BLUE CROSS/BLUE SHIELD | Attending: Emergency Medicine | Admitting: Emergency Medicine

## 2018-05-06 ENCOUNTER — Other Ambulatory Visit: Payer: Self-pay

## 2018-05-06 DIAGNOSIS — Z21 Asymptomatic human immunodeficiency virus [HIV] infection status: Secondary | ICD-10-CM | POA: Insufficient documentation

## 2018-05-06 DIAGNOSIS — R002 Palpitations: Secondary | ICD-10-CM | POA: Diagnosis not present

## 2018-05-06 DIAGNOSIS — F17210 Nicotine dependence, cigarettes, uncomplicated: Secondary | ICD-10-CM | POA: Insufficient documentation

## 2018-05-06 DIAGNOSIS — Z79899 Other long term (current) drug therapy: Secondary | ICD-10-CM | POA: Insufficient documentation

## 2018-05-06 LAB — CBC
HCT: 45.1 % (ref 39.0–52.0)
HEMOGLOBIN: 14.7 g/dL (ref 13.0–17.0)
MCH: 31.4 pg (ref 26.0–34.0)
MCHC: 32.6 g/dL (ref 30.0–36.0)
MCV: 96.4 fL (ref 80.0–100.0)
Platelets: 197 10*3/uL (ref 150–400)
RBC: 4.68 MIL/uL (ref 4.22–5.81)
RDW: 12.2 % (ref 11.5–15.5)
WBC: 7.9 10*3/uL (ref 4.0–10.5)
nRBC: 0 % (ref 0.0–0.2)

## 2018-05-06 LAB — TROPONIN I: Troponin I: <0.03 ng/mL (ref <0.03)

## 2018-05-06 LAB — BASIC METABOLIC PANEL
Anion gap: 8 (ref 5–15)
BUN: 9 mg/dL (ref 6–20)
CHLORIDE: 102 mmol/L (ref 98–111)
CO2: 28 mmol/L (ref 22–32)
Calcium: 9.1 mg/dL (ref 8.9–10.3)
Creatinine, Ser: 1.27 mg/dL — ABNORMAL HIGH (ref 0.61–1.24)
GFR calc Af Amer: >60 mL/min (ref >60)
GFR calc non Af Amer: >60 mL/min (ref >60)
Glucose, Bld: 99 mg/dL (ref 70–99)
POTASSIUM: 3.9 mmol/L (ref 3.5–5.1)
Sodium: 138 mmol/L (ref 135–145)

## 2018-05-06 NOTE — ED Provider Notes (Signed)
[facility] Emergency Department Provider Note ____________________________________________   First MD Initiated Contact with Patient 05/06/18 1113     (approximate)  I have reviewed the triage vital signs and the nursing notes.   HISTORY  Chief Complaint Palpitations    HPI Jose George is a 47 y.o. male with PMH as noted below who presents with palpitations, occurring intermittently over the last week, lasting for up to a few hours at a time, and characterized by sensation of his heart beating fast and then occasional "skipped beats."  He denies associated lightheadedness, shortness of breath, nausea, or chest pain.  He states he had one prior episode that was similar but not quite as intense.  He does report some recent anxiety and thought that that might be the cause of his symptoms although he does not feel anxious most of the time during the symptoms.   Past Medical History:  Diagnosis Date  . HIV (human immunodeficiency virus infection) (HCC)     There are no active problems to display for this patient.   History reviewed. No pertinent surgical history.  Prior to Admission medications   Medication Sig Start Date End Date Taking? Authorizing Provider  elvitegravir-cobicistat-emtricitabine-tenofovir (GENVOYA) 150-150-200-10 MG TABS tablet Take 1 tablet by mouth daily with breakfast.    [provider]    Allergies Patient has no known allergies.  No family history on file.  Social History Social History   Tobacco Use  . Smoking status: Current Every Day Smoker    Packs/day: 0.10    Types: Cigarettes  . Smokeless tobacco: Never Used  Substance Use Topics  . Alcohol use: No  . Drug use: Not on file    Review of Systems  Constitutional: No fever. Eyes: No visual changes. ENT: No sore throat. Cardiovascular: Denies chest pain.  Positive for palpitations. Respiratory: Denies shortness of breath. Gastrointestinal: No  nausea.  Genitourinary: Negative for flank pain.  Musculoskeletal: Negative for back pain. Skin: Negative for rash. Neurological: Negative for headache.   ____________________________________________   PHYSICAL EXAM:  VITAL SIGNS: ED Triage Vitals  Enc Vitals Group     BP 05/06/18 1055 127/85     Pulse Rate 05/06/18 1055 100     Resp 05/06/18 1055 20     Temp 05/06/18 1055 98.2 F (36.8 C)     Temp Source 05/06/18 1055 Oral     SpO2 05/06/18 1055 96 %     Weight 05/06/18 1050 175 lb (79.4 kg)     Height 05/06/18 1050 5\' 10"  (1.778 m)     Head Circumference --      Peak Flow --      Pain Score 05/06/18 1050 0     Pain Loc --      Pain Edu? --      Excl. in GC? --     Constitutional: Alert and oriented. Well appearing and in no acute distress. Eyes: Conjunctivae are normal.  Head: Atraumatic. Nose: No congestion/rhinnorhea. Mouth/Throat: Mucous membranes are moist.   Neck: Normal range of motion.  Cardiovascular: Normal rate, regular rhythm. Grossly normal heart sounds.  Good peripheral circulation. Respiratory: Normal respiratory effort.  No retractions. Lungs CTAB. Gastrointestinal:  No distention.  Musculoskeletal:  Extremities warm and well perfused.  Neurologic:  Normal speech and language. No gross focal neurologic deficits are appreciated.  Skin:  Skin is warm and dry. No rash noted. Psychiatric: Mood and affect are normal. Speech and behavior are normal.  ____________________________________________  LABS (all labs ordered are listed, but only abnormal results are displayed)  Labs Reviewed  BASIC METABOLIC PANEL - Abnormal; Notable for the following components:      Result Value   Creatinine, Ser 1.27 (*)    All other components within normal limits  CBC  TROPONIN I   ____________________________________________  EKG  ED ECG REPORT I, Dionne Bucy, the attending physician, personally viewed and interpreted this ECG.  Date: 05/06/2018 EKG  Time: 1046 Rate: 97 Rhythm: normal sinus rhythm QRS Axis: normal Intervals: normal ST/T Wave abnormalities: normal Narrative Interpretation: no evidence of acute ischemia  ____________________________________________  RADIOLOGY    ____________________________________________   PROCEDURES  Procedure(s) performed: No  Procedures  Critical Care performed: No ____________________________________________   INITIAL IMPRESSION / ASSESSMENT AND PLAN / ED COURSE  Pertinent labs & imaging results that were available during my care of the patient were reviewed by me and considered in my medical decision making (see chart for details).  47 year old male with a history of HIV presents with intermittent palpitations over the last week, with no chest pain, shortness of breath or significant associated symptoms.  He does endorse some recent anxiety.  I reviewed the past medical records in Epic; the patient was seen in the ED in July with complaints at that time that were described as chest discomfort and sinus pressure.  He initially described that episode as being similar, but then stated that it was different and he was not having palpitations then.  He is also not having chest pain now.  On exam, the patient is well-appearing and his vital signs are normal.  His EKG is normal and there are no findings such as short PR that would suggest the presence of an arrhythmia.  Overall I suspect anxiety or other relatively benign cause.  We will obtain basic labs and troponin and observe the patient for 1 to 2 hours to see if there is any other abnormal rhythm on the monitor.  If the work-up is negative, I anticipate discharge home with cardiology referral.  ----------------------------------------- 12:56 PM on 05/06/2018 -----------------------------------------  Heart rate is now down to the 70s and the patient states that he is feeling better.  He has had no notable events on the monitor.  His  labs are unremarkable.  He is stable for discharge home at this time.  I counseled him on the results of the work-up.  I have provided cardiology referral.  Return precautions given, and he expresses understanding. ____________________________________________   FINAL CLINICAL IMPRESSION(S) / ED DIAGNOSES  Final diagnoses:  Palpitations      NEW MEDICATIONS STARTED DURING THIS VISIT:  New Prescriptions   No medications on file     Note:  This document was prepared using Dragon voice recognition software and may include unintentional dictation errors.    Dionne Bucy, MD 05/06/18 1256

## 2018-05-06 NOTE — ED Triage Notes (Signed)
Patient to ER for c/o palpitations for approx 1 week. Denies any previous history of the same. Denies any chest pain.

## 2018-05-06 NOTE — Discharge Instructions (Addendum)
Return to the ER for new, worsening, persistent severe palpitations, chest pain or tightness, difficulty breathing, lightheadedness or any other new or worsening symptoms that concern you.  You can make an appointment to see a cardiologist within the next several weeks for further work-up if your symptoms continue.

## 2018-05-06 NOTE — ED Notes (Signed)
Pt c/o heart palpitations for a week off and on - he states that he feels like it is anxiety and then "heart skips a beat every 3-4 beats"

## 2018-05-10 ENCOUNTER — Other Ambulatory Visit: Payer: Self-pay

## 2018-05-10 ENCOUNTER — Observation Stay (HOSPITAL_BASED_OUTPATIENT_CLINIC_OR_DEPARTMENT_OTHER)
Admission: EM | Admit: 2018-05-10 | Discharge: 2018-05-12 | Disposition: A | Discharge status: Home / Self Care | Payer: BLUE CROSS/BLUE SHIELD | Attending: Cardiology | Admitting: Cardiology

## 2018-05-10 ENCOUNTER — Encounter (HOSPITAL_BASED_OUTPATIENT_CLINIC_OR_DEPARTMENT_OTHER): Payer: Self-pay | Admitting: *Deleted

## 2018-05-10 DIAGNOSIS — Z79899 Other long term (current) drug therapy: Secondary | ICD-10-CM | POA: Insufficient documentation

## 2018-05-10 DIAGNOSIS — F17210 Nicotine dependence, cigarettes, uncomplicated: Secondary | ICD-10-CM | POA: Diagnosis not present

## 2018-05-10 DIAGNOSIS — R002 Palpitations: Secondary | ICD-10-CM | POA: Diagnosis present

## 2018-05-10 DIAGNOSIS — B20 Human immunodeficiency virus [HIV] disease: Secondary | ICD-10-CM | POA: Diagnosis not present

## 2018-05-10 DIAGNOSIS — I472 Ventricular tachycardia: Principal | ICD-10-CM | POA: Insufficient documentation

## 2018-05-10 DIAGNOSIS — I4729 Other ventricular tachycardia: Secondary | ICD-10-CM

## 2018-05-10 LAB — TROPONIN I

## 2018-05-10 MED ORDER — AMIODARONE HCL IN DEXTROSE 360-4.14 MG/200ML-% IV SOLN
60.0000 mg/h | INTRAVENOUS | Status: DC
  Administered 2018-05-11 (×2): 60 mg/h via INTRAVENOUS
  Filled 2018-05-10: qty 200

## 2018-05-10 MED ORDER — AMIODARONE HCL IN DEXTROSE 360-4.14 MG/200ML-% IV SOLN
30.0000 mg/h | INTRAVENOUS | Status: DC
  Filled 2018-05-10: qty 200

## 2018-05-10 MED ORDER — AMIODARONE LOAD VIA INFUSION
150.0000 mg | Freq: Once | INTRAVENOUS | Status: AC
  Administered 2018-05-11: 150 mg via INTRAVENOUS
  Filled 2018-05-10: qty 83.34

## 2018-05-10 MED ORDER — AMIODARONE IV BOLUS ONLY 150 MG/100ML
INTRAVENOUS | Status: AC
  Filled 2018-05-10: qty 100

## 2018-05-10 MED ORDER — AMIODARONE HCL IN DEXTROSE 360-4.14 MG/200ML-% IV SOLN
INTRAVENOUS | Status: AC
  Filled 2018-05-10: qty 200

## 2018-05-10 NOTE — ED Notes (Signed)
C/o irregular heart rate and tightness in chest x 2 weeks  Has been seen for same  Denies pain

## 2018-05-10 NOTE — ED Triage Notes (Signed)
Heart palpitations on and off for 2 weeks. He was seen in the ED at [facility] last week. He was advised to decrease his caffeine intake and follow up with cardiology. His appointment is in January. No pain but feels a tightness in his throat like he has been having on and off.

## 2018-05-10 NOTE — ED Provider Notes (Signed)
[facility] EMERGENCY DEPARTMENT Provider Note   CSN: [id number] Arrival date & time: 05/10/18  2247     History   Chief Complaint Chief Complaint  Patient presents with  . Palpitations    HPI Jose George is a 47 y.o. male.  Patient with history of HIV presenting with 2 weeks of palpitations, tightness in his chest, dizziness, lightheadedness and head pressure.  Reports he is having frequent palpitations associate with dizziness and pressure in his head and lightheadedness.  He was seen for this at [facility] on October 31 and told that he is having PVCs and given referral to cardiology.  He returns because he is having persistent symptoms that are becoming more frequent causing him to have dizziness and lightheadedness.  He denies any chest pain but has had tightness in his chest, shortness of breath, dizziness and lightheadedness with chest pressure.  He does not take any cardiac or blood pressure medications.  But denies ever seeing a cardiologist.  The history is provided by the patient.  Palpitations   Associated symptoms include dizziness and shortness of breath. Pertinent negatives include no fever, no chest pain, no abdominal pain, no nausea, no vomiting and no weakness.    Past Medical History:  Diagnosis Date  . HIV (human immunodeficiency virus infection) (HCC)     There are no active problems to display for this patient.   History reviewed. No pertinent surgical history.      Home Medications    Prior to Admission medications   Medication Sig Start Date End Date Taking? Authorizing Provider  elvitegravir-cobicistat-emtricitabine-tenofovir (GENVOYA) 150-150-200-10 MG TABS tablet Take 1 tablet by mouth daily with breakfast.    [provider]    Family History No family history on file.  Social History Social History   Tobacco Use  . Smoking status: Current Every Day Smoker    Packs/day: 0.10    Types: Cigarettes  . Smokeless  tobacco: Never Used  Substance Use Topics  . Alcohol use: No  . Drug use: Not on file     Allergies   Patient has no known allergies.   Review of Systems Review of Systems  Constitutional: Negative for activity change, appetite change and fever.  HENT: Negative for congestion and rhinorrhea.   Eyes: Negative for visual disturbance.  Respiratory: Positive for chest tightness and shortness of breath.   Cardiovascular: Positive for palpitations. Negative for chest pain.  Gastrointestinal: Negative for abdominal pain, nausea and vomiting.  Endocrine: Negative for polyuria.  Genitourinary: Negative for dysuria, flank pain, testicular pain and urgency.  Musculoskeletal: Negative for arthralgias and myalgias.  Skin: Negative for rash.  Neurological: Positive for dizziness and light-headedness. Negative for weakness.   all other systems are negative except as noted in the HPI and PMH.     Physical Exam Updated Vital Signs BP (!) 142/92   Pulse 96   Temp 98 F (36.7 C) (Oral)   Resp 20   Ht 5\' 10"  (1.778 m)   Wt 79.3 kg   SpO2 99%   BMI 25.08 kg/m   Physical Exam  Constitutional: He is oriented to person, place, and time. He appears well-developed and well-nourished. No distress.  HENT:  Head: Normocephalic and atraumatic.  Mouth/Throat: Oropharynx is clear and moist. No oropharyngeal exudate.  Eyes: Pupils are equal, round, and reactive to light. Conjunctivae and EOM are normal.  Neck: Normal range of motion. Neck supple.  No meningismus.  Cardiovascular: Normal rate, regular rhythm, normal heart sounds  and intact distal pulses.  No murmur heard. Frequent PVCs on monitor with several runs of nonsustained ventricular tachycardia  Pulmonary/Chest: Effort normal and breath sounds normal. No respiratory distress. He exhibits no tenderness.  Abdominal: Soft. There is no tenderness. There is no rebound and no guarding.  Musculoskeletal: Normal range of motion. He exhibits no  edema or tenderness.  Neurological: He is alert and oriented to person, place, and time. No cranial nerve deficit. He exhibits normal muscle tone. Coordination normal.  No ataxia on finger to nose bilaterally. No pronator drift. 5/5 strength throughout. CN 2-12 intact.Equal grip strength. Sensation intact.   Skin: Skin is warm.  Psychiatric: He has a normal mood and affect. His behavior is normal.  Nursing note and vitals reviewed.    ED Treatments / Results  Labs (all labs ordered are listed, but only abnormal results are displayed) Labs Reviewed  TROPONIN I  CBC WITH DIFFERENTIAL/PLATELET  BASIC METABOLIC PANEL  MAGNESIUM  D-DIMER, QUANTITATIVE (NOT AT [facility])  TSH    EKG EKG Interpretation  Date/Time:  Monday May 10 2018 22:51:24 EST Ventricular Rate:  102 PR Interval:  148 QRS Duration: 88 QT Interval:  332 QTC Calculation: 432 R Axis:   82 Text Interpretation:  Sinus tachycardia with frequent and consecutive Premature ventricular complexes Abnormal ECG frequent PVCs,nonsustained VT Confirmed by Glynn Octave [phone]) on 05/10/2018 11:41:30 PM   Radiology No results found.  Procedures Procedures (including critical care time)  Medications Ordered in ED Medications - No data to display   Initial Impression / Assessment and Plan / ED Course  I have reviewed the triage vital signs and the nursing notes.  Pertinent labs & imaging results that were available during my care of the patient were reviewed by me and considered in my medical decision making (see chart for details).    Recurrent palpitations with chest pressure, shortness of breath, dizziness and lightheadedness.  Patient with frequent PVCs and several rounds of nonsustained VT.  Electrolytes will be checked and patient is given empiric 2 g of magnesium.  Patient with recurrent episodes of frequent PVCs and nonsustained particular tachycardia up to 10 seconds. Blood pressure and mental status  remained stable.  Potassium and magnesium are normal.  Troponin and d-dimer negative. Patient started on amiodarone loading dose with infusion.  Discussed with cardiology Dr. Clarisa Kindred.  He accepts patient to [facility] for observation.  Agrees with continuing amiodarone infusion.   PVCs are becoming less frequent and patient's symptoms are improving. Blood pressure and mental status are stable at time of transfer.  CRITICAL CARE Performed by: Glynn Octave Total critical care time: 35 minutes Critical care time was exclusive of separately billable procedures and treating other patients. Critical care was necessary to treat or prevent imminent or life-threatening deterioration. Critical care was time spent personally by me on the following activities: development of treatment plan with patient and/or surrogate as well as nursing, discussions with consultants, evaluation of patient's response to treatment, examination of patient, obtaining history from patient or surrogate, ordering and performing treatments and interventions, ordering and review of laboratory studies, ordering and review of radiographic studies, pulse oximetry and re-evaluation of patient's condition.   Final Clinical Impressions(s) / ED Diagnoses   Final diagnoses:  Nonsustained ventricular tachycardia [facility])    ED Discharge Orders    None       Glynn Octave, MD 05/11/18 [phone]

## 2018-05-11 ENCOUNTER — Ambulatory Visit (HOSPITAL_COMMUNITY): Payer: BLUE CROSS/BLUE SHIELD

## 2018-05-11 ENCOUNTER — Observation Stay (HOSPITAL_BASED_OUTPATIENT_CLINIC_OR_DEPARTMENT_OTHER): Payer: BLUE CROSS/BLUE SHIELD

## 2018-05-11 DIAGNOSIS — Z79899 Other long term (current) drug therapy: Secondary | ICD-10-CM | POA: Diagnosis not present

## 2018-05-11 DIAGNOSIS — I4729 Other ventricular tachycardia: Secondary | ICD-10-CM

## 2018-05-11 DIAGNOSIS — F17210 Nicotine dependence, cigarettes, uncomplicated: Secondary | ICD-10-CM | POA: Diagnosis not present

## 2018-05-11 DIAGNOSIS — B20 Human immunodeficiency virus [HIV] disease: Secondary | ICD-10-CM | POA: Diagnosis not present

## 2018-05-11 DIAGNOSIS — I472 Ventricular tachycardia: Secondary | ICD-10-CM | POA: Diagnosis not present

## 2018-05-11 DIAGNOSIS — R002 Palpitations: Secondary | ICD-10-CM | POA: Diagnosis present

## 2018-05-11 LAB — CBC WITH DIFFERENTIAL/PLATELET
Abs Immature Granulocytes: 0.03 10*3/uL (ref 0.00–0.07)
BASOS PCT: 1 %
Basophils Absolute: 0.1 10*3/uL (ref 0.0–0.1)
EOS ABS: 0.3 10*3/uL (ref 0.0–0.5)
Eosinophils Relative: 3 %
HCT: 43.9 % (ref 39.0–52.0)
Hemoglobin: 14.1 g/dL (ref 13.0–17.0)
Immature Granulocytes: 0 %
Lymphocytes Relative: 34 %
Lymphs Abs: 3.3 10*3/uL (ref 0.7–4.0)
MCH: 31.3 pg (ref 26.0–34.0)
MCHC: 32.1 g/dL (ref 30.0–36.0)
MCV: 97.3 fL (ref 80.0–100.0)
MONO ABS: 0.7 10*3/uL (ref 0.1–1.0)
MONOS PCT: 8 %
Neutro Abs: 5.3 10*3/uL (ref 1.7–7.7)
Neutrophils Relative %: 54 %
PLATELETS: 210 10*3/uL (ref 150–400)
RBC: 4.51 MIL/uL (ref 4.22–5.81)
RDW: 12.3 % (ref 11.5–15.5)
WBC: 9.8 10*3/uL (ref 4.0–10.5)
nRBC: 0 % (ref 0.0–0.2)

## 2018-05-11 LAB — BASIC METABOLIC PANEL
ANION GAP: 7 (ref 5–15)
Anion gap: 9 (ref 5–15)
BUN: 10 mg/dL (ref 6–20)
BUN: 6 mg/dL (ref 6–20)
CALCIUM: 9.5 mg/dL (ref 8.9–10.3)
CALCIUM: 8.9 mg/dL (ref 8.9–10.3)
CO2: 25 mmol/L (ref 22–32)
CO2: 26 mmol/L (ref 22–32)
Chloride: 102 mmol/L (ref 98–111)
Chloride: 104 mmol/L (ref 98–111)
Creatinine, Ser: 1.12 mg/dL (ref 0.61–1.24)
Creatinine, Ser: 1.03 mg/dL (ref 0.61–1.24)
GFR calc Af Amer: >60 mL/min (ref >60)
GFR calc Af Amer: >60 mL/min (ref >60)
GLUCOSE: 96 mg/dL (ref 70–99)
Glucose, Bld: 109 mg/dL — ABNORMAL HIGH (ref 70–99)
POTASSIUM: 3.7 mmol/L (ref 3.5–5.1)
Potassium: 4.1 mmol/L (ref 3.5–5.1)
Sodium: 136 mmol/L (ref 135–145)
Sodium: 137 mmol/L (ref 135–145)

## 2018-05-11 LAB — MRSA PCR SCREENING: MRSA by PCR: NEGATIVE

## 2018-05-11 LAB — PROTIME-INR
INR: 0.98
PROTHROMBIN TIME: 12.9 s (ref 11.4–15.2)

## 2018-05-11 LAB — CBC
HCT: 44.4 % (ref 39.0–52.0)
Hemoglobin: 14.6 g/dL (ref 13.0–17.0)
MCH: 31.5 pg (ref 26.0–34.0)
MCHC: 32.9 g/dL (ref 30.0–36.0)
MCV: 95.9 fL (ref 80.0–100.0)
PLATELETS: 187 10*3/uL (ref 150–400)
RBC: 4.63 MIL/uL (ref 4.22–5.81)
RDW: 12.2 % (ref 11.5–15.5)
WBC: 8.7 10*3/uL (ref 4.0–10.5)
nRBC: 0 % (ref 0.0–0.2)

## 2018-05-11 LAB — MAGNESIUM: Magnesium: 2.2 mg/dL (ref 1.7–2.4)

## 2018-05-11 LAB — D-DIMER, QUANTITATIVE: D-Dimer, Quant: 0.36 ug/mL-FEU (ref 0.00–0.50)

## 2018-05-11 LAB — TSH: TSH: 1.615 u[IU]/mL (ref 0.350–4.500)

## 2018-05-11 LAB — APTT: APTT: 36 s (ref 24–36)

## 2018-05-11 MED ORDER — ELVITEG-COBIC-EMTRICIT-TENOFAF 150-150-200-10 MG PO TABS
1.0000 | ORAL_TABLET | Freq: Every day | ORAL | Status: DC
  Administered 2018-05-11 – 2018-05-12 (×2): 1 via ORAL
  Filled 2018-05-11 (×2): qty 1

## 2018-05-11 MED ORDER — NADOLOL 20 MG PO TABS
20.0000 mg | ORAL_TABLET | Freq: Two times a day (BID) | ORAL | Status: DC
  Administered 2018-05-11: 20 mg via ORAL
  Filled 2018-05-11: qty 1

## 2018-05-11 MED ORDER — MAGNESIUM SULFATE 2 GM/50ML IV SOLN
INTRAVENOUS | Status: AC
  Filled 2018-05-11: qty 50

## 2018-05-11 MED ORDER — MAGNESIUM SULFATE 2 GM/50ML IV SOLN
2.0000 g | Freq: Once | INTRAVENOUS | Status: AC
  Administered 2018-05-11: 2 g via INTRAVENOUS

## 2018-05-11 MED ORDER — METOPROLOL TARTRATE 12.5 MG HALF TABLET
12.5000 mg | ORAL_TABLET | Freq: Two times a day (BID) | ORAL | Status: DC
  Administered 2018-05-11: 12.5 mg via ORAL
  Filled 2018-05-11: qty 1

## 2018-05-11 MED ORDER — METOPROLOL TARTRATE 12.5 MG HALF TABLET: 12.5000 mg | ORAL_TABLET | Freq: Two times a day (BID) | ORAL | Status: DC

## 2018-05-11 MED ORDER — ENOXAPARIN SODIUM 40 MG/0.4ML ~~LOC~~ SOLN
40.0000 mg | SUBCUTANEOUS | Status: DC
  Filled 2018-05-11: qty 0.4

## 2018-05-11 NOTE — Progress Notes (Signed)
  Echocardiogram 2D Echocardiogram has been performed.  Delcie Roch 05/11/2018, 12:30 PM

## 2018-05-11 NOTE — Progress Notes (Signed)
   Progress Note  Patient Name: Jose George Date of Encounter: 05/11/2018  Primary Cardiologist: Eden Emms  Subjective   Palpitations no dyspnea or chest pain   Inpatient Medications    Scheduled Meds: . elvitegravir-cobicistat-emtricitabine-tenofovir  1 tablet Oral Q breakfast  . enoxaparin (LOVENOX) injection  40 mg Subcutaneous Q24H  . metoprolol tartrate  12.5 mg Oral BID   Continuous Infusions: . amiodarone 30 mg/hr (05/11/18 0600)   PRN Meds:    Vital Signs    Vitals:   05/11/18 0457 05/11/18 0504 05/11/18 0602 05/11/18 0826  BP:  127/89 (!) 125/92 127/83  Pulse:    74  Resp:  17  14  Temp:    97.9 F (36.6 C)  TempSrc:    Oral  SpO2: (!) 72%     Weight:   80 kg   Height:        Intake/Output Summary (Last 24 hours) at 05/11/2018 0938 Last data filed at 05/11/2018 0054 Gross per 24 hour  Intake 134.74 ml  Output -  Net 134.74 ml   Filed Weights   05/10/18 2253 05/11/18 0239 05/11/18 0602  Weight: 79.3 kg 79.9 kg 80 kg    Telemetry    NSR runs of NSVT  - Personally Reviewed  ECG    NSR normal QT no acute ST changes PVC - Personally Reviewed  Physical Exam   GEN: No acute distress.   Neck: No JVD Cardiac: RRR, no murmurs, rubs, or gallops.  Respiratory: Clear to auscultation bilaterally. GI: Soft, nontender, non-distended  MS: No edema; No deformity. Neuro:  Nonfocal  Psych: Normal affect   Labs    Chemistry Recent Labs  Lab 05/06/18 1053 05/10/18 2317 05/11/18 0439  NA 138 136 137  K 3.9 4.1 3.7  CL 102 102 104  CO2 28 25 26   GLUCOSE 99 96 109*  BUN 9 10 6   CREATININE 1.27* 1.12 1.03  CALCIUM 9.1 9.5 8.9  GFRNONAA >60 >60 >60  GFRAA >60 >60 >60  ANIONGAP 8 9 7      Hematology Recent Labs  Lab 05/06/18 1053 05/10/18 2317 05/11/18 0439  WBC 7.9 9.8 8.7  RBC 4.68 4.51 4.63  HGB 14.7 14.1 14.6  HCT 45.1 43.9 44.4  MCV 96.4 97.3 95.9  MCH 31.4 31.3 31.5  MCHC 32.6 32.1 32.9  RDW 12.2 12.3 12.2  PLT 197 210 187     Cardiac Enzymes Recent Labs  Lab 05/06/18 1053 05/10/18 2317  TROPONINI <0.03 <0.03   No results for input(s): TROPIPOC in the last 168 hours.   BNPNo results for input(s): BNP, PROBNP in the last 168 hours.   DDimer  Recent Labs  Lab 05/10/18 2317  DDIMER 0.36     Radiology    No results found.  Cardiac Studies   TTE ETT  Pending   Patient Profile     47 y.o. male with palpitations and salvos of NSVT on telemetry admitted for w/u History  Of HIV , Anxiety denies drugs or excess ETOH  Assessment & Plan    NSVT:  TTE and ETT today d/c amiodarone need to see characteristics of ectopy May Need cardiac MRI if above tests unrevealing Will ask EP to see continue beta blocker   HIV:  Continue antiretrovirals counts good   For questions or updates, please contact [facility] Please consult [email or web address] for contact info under        Signed, Charlton Haws, MD  05/11/2018, 9:38 AM

## 2018-05-11 NOTE — H&P (Signed)
Cardiology Admission History and Physical:   Patient ID: Jose George; MRN: [id number]; DOB: 10/27/1970   Admission date: 05/10/2018  Primary Care Provider:  Billy Coast, MD Primary Cardiologist:  No primary care provider on file    Chief Complaint:  Palpitations   History of Present Illness:   Jose George is a 47 y.o. male with a history of HIV (currently undetectable viral load) who presents to the emergency department with complaints of off-and-on palpitations, lightheadedness and dizziness for the past 1 week. In the remote past he has also had episodes of atypical chest pain. Two to 3 years ago he also had multiple presentations to the ED for complaints of headaches and blurriness of vision.  At the time his work-up was negative for a TIA/stroke.  With respect to the palpitations he has a subjective sensation of his heart beating fast.  There are also occasional moments when he has "skipped beats".  His symptoms will usually last for hours.  There are no specific aggravating factors. He was seen on 05/06/2018 for similar complaints.  He was found to have frequent PVCs.  He was recommended to decrease his caffeine intake and follow-up with cardiology as an outpatient.  However due to recurrent symptoms he is back in the hospital.  His exercise tolerance is near normal.  He is able to perform all of his activities of daily living without any issues.  He denies any orthopnea, paroxysmal nocturnal dyspnea or lower extremity swelling.  He does not have any dyspnea on exertion.  There is no family history of sudden cardiac death.  ECG that I reviewed personally shows sinus rhythm with frequent PVCs.  There are also 2-3 beat runs of nonsustained VT.  The QTc is 394.  His labs are as follows - d-dimer 0.36, troponin I less than 0.03, hemoglobin 14.7, hematocrit 45.1, magnesium 2.2 and potassium 4.1. He was started on Amiodarone in the ED for frequent runs of NSVT.     Past  Medical History:  Diagnosis Date  . HIV (human immunodeficiency virus infection) (HCC)     History reviewed. No pertinent surgical history.   Medications Prior to Admission: Prior to Admission medications   Medication Sig Start Date End Date Taking? Authorizing Provider  elvitegravir-cobicistat-emtricitabine-tenofovir (GENVOYA) 150-150-200-10 MG TABS tablet Take 1 tablet by mouth daily with breakfast.    [provider]     Allergies:   No Known Allergies  Social History:   Social History   Socioeconomic History  . Marital status: Single    Spouse name: Not on file  . Number of children: Not on file  . Years of education: Not on file  . Highest education level: Not on file  Occupational History  . Not on file  Social Needs  . Financial resource strain: Not on file  . Food insecurity:    Worry: Not on file    Inability: Not on file  . Transportation needs:    Medical: Not on file    Non-medical: Not on file  Tobacco Use  . Smoking status: Current Every Day Smoker    Packs/day: 0.10    Types: Cigarettes  . Smokeless tobacco: Never Used  Substance and Sexual Activity  . Alcohol use: No  . Drug use: Not on file  . Sexual activity: Not on file  Lifestyle  . Physical activity:    Days per week: Not on file    Minutes per session: Not on file  . Stress: Not on  file  Relationships  . Social connections:    Talks on phone: Not on file    Gets together: Not on file    Attends religious service: Not on file    Active member of club or organization: Not on file    Attends meetings of clubs or organizations: Not on file    Relationship status: Not on file  . Intimate partner violence:    Fear of current or ex partner: Not on file    Emotionally abused: Not on file    Physically abused: Not on file    Forced sexual activity: Not on file  Other Topics Concern  . Not on file  Social History Narrative  . Not on file     Family History:   The patient's  family history is not on file.     Review of Systems: [y] = yes, [ ]  = no   . General: Weight gain [ ] ; Weight loss [ ] ; Anorexia [ ] ; Fatigue [ ] ; Fever [ ] ; Chills [ ] ; Weakness [Y ]  . Cardiac: Chest pain/pressure [ ] ; Resting SOB [ ] ; Exertional SOB [ ] ; Orthopnea [ ] ; Pedal Edema [ ] ; Palpitations [Y ]; Syncope [ ] ; Presyncope [ ] ; Paroxysmal nocturnal dyspnea[ ]   . Pulmonary: Cough [ ] ; Wheezing[ ] ; Hemoptysis[ ] ; Sputum [ ] ; Snoring [ ]   . GI: Vomiting[ ] ; Dysphagia[ ] ; Melena[ ] ; Hematochezia [ ] ; Heartburn[ ] ; Abdominal pain [ ] ; Constipation [ ] ; Diarrhea [ ] ; BRBPR [ ]   . GU: Hematuria[ ] ; Dysuria [ ] ; Nocturia[ ]   . Vascular: Pain in legs with walking [ ] ; Pain in feet with lying flat [ ] ; Non-healing sores [ ] ; Stroke [ ] ; TIA [ ] ; Slurred speech [ ] ;  . Neuro: Headaches[ ] ; Vertigo[ ] ; Seizures[ ] ; Paresthesias[ ] ;Blurred vision [ ] ; Diplopia [ ] ; Vision changes [ ]   . Ortho/Skin: Arthritis [ ] ; Joint pain [ ] ; Muscle pain [ ] ; Joint swelling [ ] ; Back Pain [ ] ; Rash [ ]   . Psych: Depression[ ] ; Anxiety[ ]   . Heme: Bleeding problems [ ] ; Clotting disorders [ ] ; Anemia [ ]   . Endocrine: Diabetes [ ] ; Thyroid dysfunction[ ]      Physical Exam/Data:   Vitals:   05/11/18 0045 05/11/18 0054 05/11/18 0100 05/11/18 0239  BP: (!) 152/118 (!) 131/103 124/71 131/87  Pulse: (!) 111 98 74   Resp: 18 16 (!) 9 16  Temp:    98.4 F (36.9 C)  TempSrc:    Oral  SpO2: 96% 95% 97% 97%  Weight:    79.9 kg  Height:    5\' 10"  (1.778 m)    Intake/Output Summary (Last 24 hours) at 05/11/2018 0431 Last data filed at 05/11/2018 0054 Gross per 24 hour  Intake 134.74 ml  Output -  Net 134.74 ml   Filed Weights   05/10/18 2253 05/11/18 0239  Weight: 79.3 kg 79.9 kg   Body mass index is 25.27 kg/m.  General:  Well nourished, well developed, in no acute distress HEENT: normal Lymph: no adenopathy Neck: no JVD Endocrine:  No thryomegaly Vascular: No carotid bruits; FA pulses 2+  bilaterally without bruits  Cardiac:  normal S1, S2; RRR; no murmur  Lungs:  clear to auscultation bilaterally, no wheezing, rhonchi or rales  Abd: soft, nontender, no hepatomegaly  Ext: no edema Musculoskeletal:  No deformities, BUE and BLE strength normal and equal Skin: warm and dry  Neuro:  CNs 2-12 intact, no  focal abnormalities noted Psych:  Normal affect    EKG:  The ECG that I reviewed personally shows sinus rhythm with frequent PVCs.  There are also 2-3 beat runs of nonsustained VT.  The QTc is 394.    Laboratory Data:  Chemistry Recent Labs  Lab 05/06/18 1053 05/10/18 2317  NA 138 136  K 3.9 4.1  CL 102 102  CO2 28 25  GLUCOSE 99 96  BUN 9 10  CREATININE 1.27* 1.12  CALCIUM 9.1 9.5  GFRNONAA >60 >60  GFRAA >60 >60  ANIONGAP 8 9    No results for input(s): PROT, ALBUMIN, AST, ALT, ALKPHOS, BILITOT in the last 168 hours. Hematology Recent Labs  Lab 05/06/18 1053 05/10/18 2317  WBC 7.9 9.8  RBC 4.68 4.51  HGB 14.7 14.1  HCT 45.1 43.9  MCV 96.4 97.3  MCH 31.4 31.3  MCHC 32.6 32.1  RDW 12.2 12.3  PLT 197 210   Cardiac Enzymes Recent Labs  Lab 05/06/18 1053 05/10/18 2317  TROPONINI <0.03 <0.03   No results for input(s): TROPIPOC in the last 168 hours.  BNPNo results for input(s): BNP, PROBNP in the last 168 hours.  DDimer  Recent Labs  Lab 05/10/18 2317  DDIMER 0.36    Radiology/Studies:  No results found.  Assessment and Plan:   1. Frequent PVCs and Non-sustained VT  Patient presents with a multitude of subjective complaints.  He has palpitations along with lightheadedness and dizziness.  In the ED the ECG reveals short runs of NSVT.  The QTc is normal and there is no evidence of preexcitation.  There is no electrolyte imbalance.  There is also no evidence of hypoxia, myocardial ischemia, anemia, hypotension, heart failure or suspected adverse drug effect.  It is possible that his frequent PVCs and NSVT are triggered by a high adrenergic  state secondary to anxiety.  If he does not respond to pharmacological therapy then a catheter-based intervention can also be considered.  - Admit to telemetry for overnight observation - Maintain serum potassium >4.0 and magnesium >2.0 - Obtain a transthoracic echocardiogram to rule out any structural heart disease  - Start the patient on a low-dose beta-blocker to suppress his NSVT (metoprolol tartrate 12.5 mg twice daily) - Have EP review his case in the AM.   Severity of Illness: The appropriate patient status for this patient is OBSERVATION. Observation status is judged to be reasonable and necessary in order to provide the required intensity of service to ensure the patient's safety. The patient's presenting symptoms, physical exam findings, and initial radiographic and laboratory data in the context of their medical condition is felt to place them at decreased risk for further clinical deterioration. Furthermore, it is anticipated that the patient will be medically stable for discharge from the hospital within 2 midnights of admission. The following factors support the patient status of observation.   " The patient's presenting symptoms include palpitations. " The physical exam findings include extra beats. " The initial radiographic and laboratory data are non-sustained VT.     For questions or updates, please contact [facility] Please consult [email or web address] for contact info under Cardiology/STEMI.    Signed, Lonie Peak, MD  05/11/2018 4:31 AM

## 2018-05-11 NOTE — Consult Note (Addendum)
ELECTROPHYSIOLOGY CONSULT NOTE    Patient ID: Jose George MRN: [id number], DOB/AGE: Aug 27, 1970 47 y.o.  Admit date: 05/10/2018 Date of Consult: 05/11/2018  Primary Physician: Jose Coast, MD Primary Cardiologist: Jose George (new this admission) Electrophysiologist: Jose George (new this admission)  Patient Profile: Jose George is a 47 y.o. male with a history of HIV (well controlled) who is being seen today for the evaluation of PVC's at the request of Jose George.  HPI:  Jose George is a 47 y.o. male with no significant past cardiac history. Two weeks ago, he awoke with palpitations.  These persisted and he went to the ER 10/31 with worsening palpitations. He was found to have PVC's and advised to decrease caffeine intake. His symptoms persisted and he presented back to the ER 05/11/18 for re-evaluation.  He again had frequent ventricular ectopy and was admitted for further evaluation.  He was initially placed on amiodarone but this was discontinued.  Echocardiogram today demonstrates normal LVEF, no RWMA.  EP has been asked to evaluate for treatment options.  ETT is planned per Dr Fabio Bering note.   He denies chest pain, dyspnea, PND, orthopnea, nausea, vomiting, dizziness, syncope, edema, weight gain, or early satiety.  Past Medical History:  Diagnosis Date  . HIV (human immunodeficiency virus infection) (HCC)      Surgical History: History reviewed. No pertinent surgical history.   Medications Prior to Admission  Medication Sig Dispense Refill Last Dose  . acetaminophen (TYLENOL) 500 MG tablet Take 500 mg by mouth every 6 (six) hours as needed for headache.   unknown at prn  . elvitegravir-cobicistat-emtricitabine-tenofovir (GENVOYA) 150-150-200-10 MG TABS tablet Take 1 tablet by mouth daily with breakfast.   05/10/2018 at evening    Inpatient Medications:  . elvitegravir-cobicistat-emtricitabine-tenofovir  1 tablet Oral Q breakfast  . enoxaparin (LOVENOX) injection   40 mg Subcutaneous Q24H  . nadolol  20 mg Oral BID    Allergies: No Known Allergies  Social History   Socioeconomic History  . Marital status: Single    Spouse name: Not on file  . Number of children: Not on file  . Years of education: Not on file  . Highest education level: Not on file  Occupational History  . Not on file  Social Needs  . Financial resource strain: Not on file  . Food insecurity:    Worry: Not on file    Inability: Not on file  . Transportation needs:    Medical: Not on file    Non-medical: Not on file  Tobacco Use  . Smoking status: Current Every Day Smoker    Packs/day: 0.10    Types: Cigarettes  . Smokeless tobacco: Never Used  Substance and Sexual Activity  . Alcohol use: No  . Drug use: Not on file  . Sexual activity: Not on file  Lifestyle  . Physical activity:    Days per week: Not on file    Minutes per session: Not on file  . Stress: Not on file  Relationships  . Social connections:    Talks on phone: Not on file    Gets together: Not on file    Attends religious service: Not on file    Active member of club or organization: Not on file    Attends meetings of clubs or organizations: Not on file    Relationship status: Not on file  . Intimate partner violence:    Fear of current or ex partner: Not on file    Emotionally abused: Not on  file    Physically abused: Not on file    Forced sexual activity: Not on file  Other Topics Concern  . Not on file  Social History Narrative  . Not on file     Family History: no premature CAD   Review of Systems: All other systems reviewed and are otherwise negative except as noted above.  Physical Exam: Vitals:   05/11/18 0504 05/11/18 0602 05/11/18 0826 05/11/18 1341  BP: 127/89 (!) 125/92 127/83 110/80  Pulse:   74 77  Resp: 17  14 17   Temp:   97.9 F (36.6 C) 98.4 F (36.9 C)  TempSrc:   Oral Oral  SpO2:    98%  Weight:  80 kg    Height:        GEN- The patient is well appearing,  alert and oriented x 3 today.   HEENT: normocephalic, atraumatic; sclera clear, conjunctiva pink; hearing intact; oropharynx clear; neck supple Lungs- Clear to ausculation bilaterally, normal work of breathing.  No wheezes, rales, rhonchi Heart- Regular rate and rhythm, +PVC's GI- soft, non-tender, non-distended, bowel sounds present Extremities- no clubbing, cyanosis, or edema  MS- no significant deformity or atrophy Skin- warm and dry, no rash or lesion Psych- euthymic mood, full affect Neuro- strength and sensation are intact  Labs:   Lab Results  Component Value Date   WBC 8.7 05/11/2018   HGB 14.6 05/11/2018   HCT 44.4 05/11/2018   MCV 95.9 05/11/2018   PLT 187 05/11/2018    Recent Labs  Lab 05/11/18 0439  NA 137  K 3.7  CL 104  CO2 26  BUN 6  CREATININE 1.03  CALCIUM 8.9  GLUCOSE 109*      Radiology/Studies: No results found.  ZOX:WRUEA rhythm with frequent ventricular ectopy, RBBB with transition at V3 (personally reviewed)  TELEMETRY: sinus rhythm with PVC's (personally reviewed)   Assessment/Plan: 1.  PVC's/NSVT Symptomatic but thankfully in the setting of normal LVEF Consider ischemic evaluation Change Metoprolol to Nadolol to see if better effect (done today) Keep K >3.9, Mg >1.8 No further inpatient EP work up planned.     For questions or updates, please contact [facility] Please consult [email or web address] for contact info under Cardiology/STEMI.  Signed, Gypsy Balsam 05/11/2018 2:32 PM  I have seen, examined the patient, and reviewed the above assessment and plan.  Changes to above are made where necessary.  On exam, RRR.  Pt with frequent symptomatic PVCs of a RBB superior axis, arising from the inferoapical LV.  EF is preserved.  Will start nadolol 20mg  daily at this time.  Avoid AADs currently.  No further inpatient EP workup advised, though I agree with Dr Jose George that an ischemic evaluation is prudent.    EP to see as needed while here. I  am happy to see in the office for follow-up. Please call with questions.  Co Sign: Hillis Range, MD 05/11/2018 4:44 PM

## 2018-05-11 NOTE — Progress Notes (Addendum)
Pt had an 11 beat run of v tach. Upon assessing the patient, he says "he can feel pressure when it happens, and the monitor dings when I talk about stressful things." I gave him his first dose of Nadolol and recommended he talk to his visitor about relaxing things and drink water instead of caffienated beverages. Will continue to monitor closely.

## 2018-05-12 ENCOUNTER — Observation Stay (HOSPITAL_BASED_OUTPATIENT_CLINIC_OR_DEPARTMENT_OTHER): Payer: BLUE CROSS/BLUE SHIELD

## 2018-05-12 DIAGNOSIS — I472 Ventricular tachycardia: Secondary | ICD-10-CM | POA: Diagnosis not present

## 2018-05-12 LAB — EXERCISE TOLERANCE TEST
CHL CUP MPHR: 174 {beats}/min
CSEPEDS: 32 s
CSEPPHR: 125 {beats}/min
Estimated workload: 13.4 METS
Exercise duration (min): 10 min
Percent HR: 71 %
Rest HR: 72 {beats}/min

## 2018-05-12 MED ORDER — NADOLOL 20 MG PO TABS
20.0000 mg | ORAL_TABLET | Freq: Every day | ORAL | Status: DC
  Administered 2018-05-12: 20 mg via ORAL
  Filled 2018-05-12: qty 1

## 2018-05-12 MED ORDER — NADOLOL 20 MG PO TABS: 20.0000 mg | ORAL_TABLET | Freq: Every day | ORAL | 2 refills | Status: DC

## 2018-05-12 MED ORDER — AMIODARONE HCL IN DEXTROSE 360-4.14 MG/200ML-% IV SOLN
60.0000 mg/h | INTRAVENOUS | Status: DC
  Administered 2018-05-12 (×2): 60 mg/h via INTRAVENOUS
  Filled 2018-05-12: qty 200

## 2018-05-12 MED ORDER — AMIODARONE HCL IN DEXTROSE 360-4.14 MG/200ML-% IV SOLN
30.0000 mg/h | INTRAVENOUS | Status: DC
  Filled 2018-05-12: qty 200

## 2018-05-12 MED ORDER — POTASSIUM CHLORIDE CRYS ER 20 MEQ PO TBCR: 40.0000 meq | EXTENDED_RELEASE_TABLET | Freq: Once | ORAL | Status: DC

## 2018-05-12 NOTE — Progress Notes (Addendum)
Electrophysiology Rounding Note  Patient Name: Jose George Date of Encounter: 05/12/2018  Primary Cardiologist: Eden Emms Electrophysiologist: Virgen Belland   Subjective   The patient is doing ok today. With ongoing palpitations and NSVT.  He thinks related to stress.  He wants to go home to take care of his cats.   Inpatient Medications    Scheduled Meds: . elvitegravir-cobicistat-emtricitabine-tenofovir  1 tablet Oral Q breakfast  . enoxaparin (LOVENOX) injection  40 mg Subcutaneous Q24H  . nadolol  20 mg Oral Daily   Continuous Infusions:  PRN Meds:    Vital Signs    Vitals:   05/12/18 0114 05/12/18 0233 05/12/18 0424 05/12/18 0533  BP: 131/87 (!) 112/91 109/65 116/75  Pulse:   74   Resp: 15 16  17   Temp:   97.8 F (36.6 C)   TempSrc:   Oral   SpO2:   95%   Weight:   79.9 kg   Height:        Intake/Output Summary (Last 24 hours) at 05/12/2018 1007 Last data filed at 05/12/2018 0913 Gross per 24 hour  Intake 1371.74 ml  Output 1950 ml  Net -578.26 ml   Filed Weights   05/11/18 0239 05/11/18 0602 05/12/18 0424  Weight: 79.9 kg 80 kg 79.9 kg    Physical Exam    GEN- The patient is well appearing, alert and oriented x 3 today.   Head- normocephalic, atraumatic Eyes-  Sclera clear, conjunctiva pink Ears- hearing intact Oropharynx- clear Neck- supple Lungs- Clear to ausculation bilaterally, normal work of breathing Heart- Irregular rate and rhythm  GI- soft, NT, ND, + BS Extremities- no clubbing, cyanosis, or edema Skin- no rash or lesion Psych- euthymic mood, full affect Neuro- strength and sensation are intact  Labs    CBC Recent Labs    05/10/18 2317 05/11/18 0439  WBC 9.8 8.7  NEUTROABS 5.3  --   HGB 14.1 14.6  HCT 43.9 44.4  MCV 97.3 95.9  PLT 210 187   Basic Metabolic Panel Recent Labs    16/10/96 2317 05/11/18 0439  NA 136 137  K 4.1 3.7  CL 102 104  CO2 25 26  GLUCOSE 96 109*  BUN 10 6  CREATININE 1.12 1.03  CALCIUM 9.5  8.9  MG 2.2  --    Cardiac Enzymes Recent Labs    05/10/18 2317  TROPONINI <0.03   BNP Invalid input(s): POCBNP D-Dimer Recent Labs    05/10/18 2317  DDIMER 0.36   Thyroid Function Tests Recent Labs    05/10/18 2317  TSH 1.615    Telemetry    SR with frequent ventricular ectopy  (personally reviewed)  Radiology    No results found.   Assessment & Plan    1.  NSVT Avoid AAD therapy for now  Continue Nadolol 20mg  daily If ETT negative, can consider Flecainide as an outpatient Follow up arranged with Dr Johney Frame next week  For questions or updates, please contact [facility] Please consult [email or web address] for contact info under Cardiology/STEMI.  Signed, Gypsy Balsam, NP  05/12/2018, 10:07 AM    I have seen, examined the patient, and reviewed the above assessment and plan.  Changes to above are made where necessary.  On exam, RRR.  Doing well currently.  Echo is low risk.  Dr Eden Emms feels that ETT is adequate for ischemic workup.  Continue nadolol with close outpatient follow-up.  May consider flecainide if arrhythmia persists in the office.  Co Sign: Hillis Range, MD 05/12/2018  9:21 PM

## 2018-05-12 NOTE — Progress Notes (Signed)
Paged cardio [name] concerning pt's frequent, non-sustained, runs of v tach. Will continue to monitor.

## 2018-05-12 NOTE — Discharge Summary (Signed)
Discharge Summary    Patient ID: Jose George,  MRN: [id number], DOB/AGE: Sep 27, 1970 47 y.o.  Admit date: 05/10/2018 Discharge date: 05/12/2018  Primary Care Provider: Billy George Primary Cardiologist: Dr. Clinton George  Discharge Diagnoses    Principal Problem:   Nonsustained ventricular tachycardia [facility]) Active Problems:   Non-sustained ventricular tachycardia (HCC)   Allergies No Known Allergies  Diagnostic Studies/Procedures    TTE: 05/11/18  Study Conclusions  - Left ventricle: The cavity size was normal. Wall thickness was   normal. Systolic function was normal. The estimated ejection   fraction was in the range of 50% to 55%. Wall motion was normal;   there were no regional wall motion abnormalities.  Impressions:  - 1. Left ventricular systolic function is preserved visually   estimated at 55 to 60%. The study is otherwise unremarkable.  ETT: 05/12/18   Blood pressure demonstrated a normal response to exercise.  There was no ST segment deviation noted during stress.   Normal ETT No significant arrhythmia  _____________   History of Present Illness     47 y.o. male with a history of HIV (currently undetectable viral load) who presented to the emergency department with complaints of off-and-on palpitations, lightheadedness and dizziness for the past 1 week. In the remote past he has also had episodes of atypical chest pain. 2 to 3 years ago he also had multiple presentations to the ED for complaints of headaches and blurriness of vision.  At the time his work-up was negative for a TIA/stroke.  With respect to the palpitations he had a subjective sensation of his heart beating fast.  There are also occasional moments when he had "skipped beats".  His symptoms would usually last for hours. There were no specific aggravating factors. He was seen on 05/06/2018 for similar complaints.  He was found to have frequent PVCs.  He was recommended to  decrease his caffeine intake and follow-up with cardiology as an outpatient.  However due to recurrent symptoms he presented back to the hospital.  His exercise tolerance is near normal.  He was able to perform all of his activities of daily living without any issues.  He denied any orthopnea, paroxysmal nocturnal dyspnea or lower extremity swelling.  He did not have any dyspnea on exertion.  There was no family history of sudden cardiac death.  ECG showed sinus rhythm with frequent PVCs.  There are also 2-3 beat runs of nonsustained VT.  The QTc was 394.  His labs are as follows - d-dimer 0.36, troponin I less than 0.03, hemoglobin 14.7, hematocrit 45.1, magnesium 2.2 and potassium 4.1. He was started on Amiodarone in the ED for frequent runs of NSVT. Admitted for further work up.   Hospital Course     Consultants: EP  He as set up for a ETT and amiodarone was stopped. EP consulted and placed on Nadolol 20mg . Plan to avoid AADs at this time. Underwent ETT which was normal. Notes indicate to consider outpatient cardiac MRI. Planned for outpatient follow up with Jose George next week, consider Flecainide as an outpatient given negative ETT.   Jose George was seen by Jose George and determined stable for discharge home. Follow up in the office has been arranged. Medications are listed below.  _____________  Discharge Vitals Blood pressure 116/75, pulse 74, temperature 97.8 F (36.6 C), temperature source Oral, resp. rate 17, height 5\' 10"  (1.778 m), weight 79.9 kg, SpO2 95 %.  Filed Weights   05/11/18 0239  05/11/18 0602 05/12/18 0424  Weight: 79.9 kg 80 kg 79.9 kg    Labs & Radiologic Studies    CBC Recent Labs    05/10/18 2317 05/11/18 0439  WBC 9.8 8.7  NEUTROABS 5.3  --   HGB 14.1 14.6  HCT 43.9 44.4  MCV 97.3 95.9  PLT 210 187   Basic Metabolic Panel Recent Labs    16/10/96 2317 05/11/18 0439  NA 136 137  K 4.1 3.7  CL 102 104  CO2 25 26  GLUCOSE 96 109*  BUN  10 6  CREATININE 1.12 1.03  CALCIUM 9.5 8.9  MG 2.2  --    Liver Function Tests No results for input(s): AST, ALT, ALKPHOS, BILITOT, PROT, ALBUMIN in the last 72 hours. No results for input(s): LIPASE, AMYLASE in the last 72 hours. Cardiac Enzymes Recent Labs    05/10/18 2317  TROPONINI <0.03   BNP Invalid input(s): POCBNP D-Dimer Recent Labs    05/10/18 2317  DDIMER 0.36   Hemoglobin A1C No results for input(s): HGBA1C in the last 72 hours. Fasting Lipid Panel No results for input(s): CHOL, HDL, LDLCALC, TRIG, CHOLHDL, LDLDIRECT in the last 72 hours. Thyroid Function Tests Recent Labs    05/10/18 2317  TSH 1.615   _____________  No results found. Disposition   Pt is being discharged home today in good condition.  Follow-up Plans & Appointments    Follow-up Information    Hillis Range, MD Follow up on 05/19/2018.   Specialty:  Cardiology Why:  at 12:30pm for your follow up appt.  Contact information: [address] [place] [address] [phone]          Discharge Instructions    Diet - low sodium heart healthy   Complete by:  As directed    Increase activity slowly   Complete by:  As directed        Discharge Medications     Medication List    TAKE these medications   acetaminophen 500 MG tablet Commonly known as:  TYLENOL Take 500 mg by mouth every 6 (six) hours as needed for headache.   GENVOYA 150-150-200-10 MG Tabs tablet Generic drug:  elvitegravir-cobicistat-emtricitabine-tenofovir Take 1 tablet by mouth daily with breakfast.   nadolol 20 MG tablet Commonly known as:  CORGARD Take 1 tablet (20 mg total) by mouth daily. Start taking on:  05/13/2018       Acute coronary syndrome (MI, NSTEMI, STEMI, etc) this admission?: No.     Outstanding Labs/Studies   N/a   Duration of Discharge Encounter   Greater than 30 minutes including physician time.  Signed, Jose Dauphinais NP-C 05/12/2018, 12:54 PM

## 2018-05-12 NOTE — Progress Notes (Signed)
Progress Note  Patient Name: Jose George Date of Encounter: 05/12/2018  Primary Cardiologist: Allred /EP  Subjective   Palpitations seem better Insists on being d/c today Has cats at home that have no food  Inpatient Medications    Scheduled Meds: . elvitegravir-cobicistat-emtricitabine-tenofovir  1 tablet Oral Q breakfast  . enoxaparin (LOVENOX) injection  40 mg Subcutaneous Q24H  . nadolol  20 mg Oral Daily   Continuous Infusions:  PRN Meds:    Vital Signs    Vitals:   05/12/18 0114 05/12/18 0233 05/12/18 0424 05/12/18 0533  BP: 131/87 (!) 112/91 109/65 116/75  Pulse:   74   Resp: 15 16  17   Temp:   97.8 F (36.6 C)   TempSrc:   Oral   SpO2:   95%   Weight:   79.9 kg   Height:        Intake/Output Summary (Last 24 hours) at 05/12/2018 0802 Last data filed at 05/12/2018 0231 Gross per 24 hour  Intake 1371.74 ml  Output 1500 ml  Net -128.26 ml   Filed Weights   05/11/18 0239 05/11/18 0602 05/12/18 0424  Weight: 79.9 kg 80 kg 79.9 kg    Telemetry    NSR PVC;s no further NSVT - Personally Reviewed  ECG    NSR normal QT normal ST segments PVC - Personally Reviewed  Physical Exam   GEN: No acute distress.   Neck: No JVD Cardiac: RRR, no murmurs, rubs, or gallops.  Respiratory: Clear to auscultation bilaterally. GI: Soft, nontender, non-distended  MS: No edema; No deformity. Neuro:  Nonfocal  Psych: Normal affect   Labs    Chemistry Recent Labs  Lab 05/06/18 1053 05/10/18 2317 05/11/18 0439  NA 138 136 137  K 3.9 4.1 3.7  CL 102 102 104  CO2 28 25 26   GLUCOSE 99 96 109*  BUN 9 10 6   CREATININE 1.27* 1.12 1.03  CALCIUM 9.1 9.5 8.9  GFRNONAA >60 >60 >60  GFRAA >60 >60 >60  ANIONGAP 8 9 7      Hematology Recent Labs  Lab 05/06/18 1053 05/10/18 2317 05/11/18 0439  WBC 7.9 9.8 8.7  RBC 4.68 4.51 4.63  HGB 14.7 14.1 14.6  HCT 45.1 43.9 44.4  MCV 96.4 97.3 95.9  MCH 31.4 31.3 31.5  MCHC 32.6 32.1 32.9  RDW 12.2 12.3 12.2   PLT 197 210 187    Cardiac Enzymes Recent Labs  Lab 05/06/18 1053 05/10/18 2317  TROPONINI <0.03 <0.03   No results for input(s): TROPIPOC in the last 168 hours.   BNPNo results for input(s): BNP, PROBNP in the last 168 hours.   DDimer  Recent Labs  Lab 05/10/18 2317  DDIMER 0.36     Radiology    No results found.  Cardiac Studies   TTE:  Normal EF no RWMA;s no valve disease  Patient Profile     47 y.o. male admitted with palpitations and left sided PVC;s / NSVT No angina EF normal TTE History of HIV on good medication with normal counts.   Assessment & Plan    NSVT:  Off amiodarone EP has seen and changed beta blocker but Nadolol not written for. TTE normal ETT Not done yet will try to expedite and d/c latter today  HIV:  Continue home antiretrovirals and f/u with ID  Will arrange f/u with Dr Johney Frame since issue is NSVT Can consider outpatient cardiac MRI   For questions or updates, please contact [facility] Please consult [email or web address]  for contact info under        Signed, Charlton Haws, MD  05/12/2018, 8:02 AM

## 2018-05-12 NOTE — Progress Notes (Signed)
Called by RN out of concern for increasing burden of symptomatic ventricular ectopy.   On review of telemetry, noted to have multiple runs of NSVT in addition to PVCs, couplets, triplets.  All ectopy appears to be increasing in frequency and severity since 1800 yesterday afternoon.   As a result, restarted amiodarone gtt (no bolus) and held nadolol for now.   Will reassess burden of NSVT after initiation of AAD therapy.   Tattiana Fakhouri K. [name], MD

## 2018-05-12 NOTE — Progress Notes (Signed)
  Amiodarone Drug - Drug Interaction Consult Note  Recommendations: Currently holding Nadolol  Amiodarone is metabolized by the cytochrome P450 system and therefore has the potential to cause many drug interactions. Amiodarone has an average plasma half-life of 50 days (range 20 to 100 days).   There is potential for drug interactions to occur several weeks or months after stopping treatment and the onset of drug interactions may be slow after initiating amiodarone.   []  Statins: Increased risk of myopathy. Simvastatin- restrict dose to 20mg  daily. Other statins: counsel patients to report any muscle pain or weakness immediately.  []  Anticoagulants: Amiodarone can increase anticoagulant effect. Consider warfarin dose reduction. Patients should be monitored closely and the dose of anticoagulant altered accordingly, remembering that amiodarone levels take several weeks to stabilize.  []  Antiepileptics: Amiodarone can increase plasma concentration of phenytoin, the dose should be reduced. Note that small changes in phenytoin dose can result in large changes in levels. Monitor patient and counsel on signs of toxicity.  [x]  Beta blockers: increased risk of bradycardia, AV block and myocardial depression. Sotalol - avoid concomitant use.  []   Calcium channel blockers (diltiazem and verapamil): increased risk of bradycardia, AV block and myocardial depression.  []   Cyclosporine: Amiodarone increases levels of cyclosporine. Reduced dose of cyclosporine is recommended.  []  Digoxin dose should be halved when amiodarone is started.  []  Diuretics: increased risk of cardiotoxicity if hypokalemia occurs.  []  Oral hypoglycemic agents (glyburide, glipizide, glimepiride): increased risk of hypoglycemia. Patient's glucose levels should be monitored closely when initiating amiodarone therapy.   []  Drugs that prolong the QT interval:  Torsades de pointes risk may be increased with concurrent use - avoid if  possible.  Monitor QTc, also keep magnesium/potassium WNL if concurrent therapy can't be avoided. Marland Kitchen Antibiotics: e.g. fluoroquinolones, erythromycin. . Antiarrhythmics: e.g. quinidine, procainamide, disopyramide, sotalol. . Antipsychotics: e.g. phenothiazines, haloperidol.  . Lithium, tricyclic antidepressants, and methadone. Thank You,  Abran Duke  05/12/2018 1:54 AM

## 2018-05-16 DIAGNOSIS — I493 Ventricular premature depolarization: Secondary | ICD-10-CM | POA: Insufficient documentation

## 2018-05-18 MED ORDER — FLECAINIDE 100 MG TABLET
Freq: Two times a day (BID) | ORAL | 0.00000 days
Start: 2018-05-18 — End: ?

## 2018-05-18 MED ORDER — LIDOCAINE HCL 1 % IJ SOLN
0.50 | INTRAMUSCULAR | Status: DC
Start: ? — End: 2018-05-18

## 2018-05-18 MED ORDER — GENERIC EXTERNAL MEDICATION
1.00 | Status: DC
Start: ? — End: 2018-05-18

## 2018-05-18 MED ORDER — METOPROLOL SUCCINATE ER 25 MG PO TB24
25.00 | ORAL_TABLET | ORAL | Status: DC
Start: 2018-05-18 — End: 2018-05-18

## 2018-05-18 MED ORDER — SODIUM CHLORIDE 0.9 % IV SOLN
250.00 | INTRAVENOUS | Status: DC
Start: ? — End: 2018-05-18

## 2018-05-18 MED ORDER — ELVITEG-COBIC-EMTRICIT-TENOFAF 150-150-200-10 MG PO TABS
1.00 | ORAL_TABLET | ORAL | Status: DC
Start: 2018-05-19 — End: 2018-05-18

## 2018-05-18 MED ORDER — FLECAINIDE ACETATE 100 MG PO TABS
100.00 | ORAL_TABLET | ORAL | Status: DC
Start: 2018-05-18 — End: 2018-05-18

## 2018-05-18 MED ORDER — ATROPINE SULFATE 1 MG/ML IJ SOLN
0.50 | INTRAMUSCULAR | Status: DC
Start: ? — End: 2018-05-18

## 2018-05-18 MED ORDER — LIDOCAINE HCL (PF) 1 % IJ SOLN
0.50 | INTRAMUSCULAR | Status: DC
Start: ? — End: 2018-05-18

## 2018-05-18 MED ORDER — ACETAMINOPHEN 325 MG PO TABS
650.00 | ORAL_TABLET | ORAL | Status: DC
Start: ? — End: 2018-05-18

## 2018-05-19 ENCOUNTER — Ambulatory Visit: Payer: BLUE CROSS/BLUE SHIELD | Admitting: Internal Medicine

## 2018-05-20 ENCOUNTER — Encounter: Payer: Self-pay | Admitting: Internal Medicine

## 2018-06-23 ENCOUNTER — Ambulatory Visit: Admit: 2018-06-23 | Discharge: 2018-06-24

## 2018-06-23 DIAGNOSIS — Z21 Asymptomatic human immunodeficiency virus [HIV] infection status: Principal | ICD-10-CM

## 2018-06-23 LAB — CBC W/ AUTO DIFF
BASOPHILS ABSOLUTE COUNT: 0.1 10*9/L (ref 0.0–0.1)
BASOPHILS RELATIVE PERCENT: 0.9 %
EOSINOPHILS ABSOLUTE COUNT: 0.3 10*9/L (ref 0.0–0.4)
EOSINOPHILS RELATIVE PERCENT: 3.2 %
HEMATOCRIT: 44 % (ref 41.0–53.0)
HEMOGLOBIN: 14.2 g/dL (ref 13.5–17.5)
LARGE UNSTAINED CELLS: 2 % (ref 0–4)
LYMPHOCYTES ABSOLUTE COUNT: 2.8 10*9/L (ref 1.5–5.0)
LYMPHOCYTES RELATIVE PERCENT: 35.5 %
MEAN CORPUSCULAR HEMOGLOBIN CONC: 32.2 g/dL (ref 31.0–37.0)
MEAN CORPUSCULAR HEMOGLOBIN: 31.3 pg (ref 26.0–34.0)
MEAN CORPUSCULAR VOLUME: 97.1 fL (ref 80.0–100.0)
MEAN PLATELET VOLUME: 8.4 fL (ref 7.0–10.0)
MONOCYTES ABSOLUTE COUNT: 0.6 10*9/L (ref 0.2–0.8)
NEUTROPHILS ABSOLUTE COUNT: 4.1 10*9/L (ref 2.0–7.5)
NEUTROPHILS RELATIVE PERCENT: 51.3 %
PLATELET COUNT: 221 10*9/L (ref 150–440)
RED BLOOD CELL COUNT: 4.53 10*12/L (ref 4.50–5.90)

## 2018-06-23 LAB — BASIC METABOLIC PANEL
BLOOD UREA NITROGEN: 9 mg/dL (ref 7–21)
BUN / CREAT RATIO: 8
CALCIUM: 9.5 mg/dL (ref 8.5–10.2)
CHLORIDE: 103 mmol/L (ref 98–107)
CO2: 26 mmol/L (ref 22.0–30.0)
CREATININE: 1.17 mg/dL (ref 0.70–1.30)
EGFR CKD-EPI AA MALE: 86 mL/min/{1.73_m2} (ref >=60)
EGFR CKD-EPI NON-AA MALE: 74 mL/min/{1.73_m2} (ref >=60)
GLUCOSE RANDOM: 88 mg/dL (ref 70–179)
POTASSIUM: 4.3 mmol/L (ref 3.5–5.0)
SODIUM: 139 mmol/L (ref 135–145)

## 2018-06-23 LAB — GLUCOSE RANDOM: Glucose:MCnc:Pt:Ser/Plas:Qn:: 88

## 2018-06-23 LAB — AST (SGOT): Aspartate aminotransferase:CCnc:Pt:Ser/Plas:Qn:: 25

## 2018-06-23 LAB — ALT (SGPT): Alanine aminotransferase:CCnc:Pt:Ser/Plas:Qn:: 18

## 2018-06-23 LAB — MEAN CORPUSCULAR VOLUME: Lab: 97.1

## 2018-06-23 LAB — BILIRUBIN TOTAL: Bilirubin:MCnc:Pt:Ser/Plas:Qn:: 0.4

## 2018-06-23 MED ORDER — BICTEGRAVIR 50 MG-EMTRICITABINE 200 MG-TENOFOVIR ALAFENAM 25 MG TABLET
ORAL_TABLET | Freq: Every day | ORAL | 11 refills | 0 days | Status: CP
Start: 2018-06-23 — End: 2018-11-26
  Filled 2018-06-29: qty 30, 30d supply, fill #0

## 2018-06-23 NOTE — Unmapped (Signed)
Assessment/Plan:      Timothy Castro, a 47 y.o. male seen today for routine HIV followup.    Plan:  HIV  ?? Doing well overall. Denies missed or late doses of TAF/FTC/EVG/COBI.   ?? Will switch to TAF/FTC/BIC to avoid cobicistat interaction with fluticasone.  ?? Now has insurance but it will change again and will send rx to [facility] for this month.     Lab Results   Component Value Date    ACD4 760 12/05/2015    CD4 36 12/05/2015    HIVCP <40 (H) 12/23/2017    HIVRS Detected (A) 12/23/2017     ?? Continue current therapy.  ?? Checking HIV RNA & safety labs (brief return).  ?? Encouraged continued excellent ARV adherence.    Anal dysplasia  ?? 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  ?? Was supposed to have repeat anoscopy 01/2018 - will reschedule    Tobacco use  ?? Counseled again today re smoking cessation. Down to 4 cigarettes a day.   ?? He is very aware of need for this because of cost and family hx - his dad died of lung ca at age 89 and was a smoker. Also motivated by his dental issues but stress impedes progress.  ?? Reviewed recommendations for cessation and available resources. Willing to discuss with our pulmonologist Dr. Sallyanne Kuster. Referral made.    Sexual health & secondary prevention  Sex with men. Monogamous with single partner. Since last visit, insertive anal, receptive anal, insertive oral and receptive oral sex and has not had add'l STI screening. He does disclose status. Never uses condoms.    Lab Results   Component Value Date    RPR Nonreactive 12/23/2017    LABRPR NON-REACTIVE 06/21/2014    CTNAA Negative 12/23/2017    CTNAA Negative 12/23/2017    CTNAA Negative 12/23/2017    GCNAA Negative 12/23/2017    GCNAA Negative 12/23/2017    GCNAA Negative 12/23/2017    SPECTYPE Swab 12/23/2017    SPECTYPE Swab 12/23/2017    SPECTYPE Urine 12/23/2017    SPECSOURCE Rectum 12/23/2017    SPECSOURCE Throat 12/23/2017    SPECSOURCE Urine 12/23/2017     ?? GC/CT NAATs -- needed but deferred to future visit  ?? 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)    B: Rectum, posterior, biopsy  - Rectal mucosa with lymphoid nodule (1 fragment)  - No squamous mucosa present for evaluation       Communicable diseases  # TB - no longer needed; negative IGRA 09/14/13 and low/no risk  # HCV - negative 07/2016; rescreen w/Ab q1-2y    Cancer screening  # Anorectal - LGSIL with AIN1 on bx 01/2017, for repeat anoscopy 01/2018 - missed appt  # Colorectal - not yet needed  # Liver - not applicable  # Lung - SCREEN AGE 30-80 IF >30 PY & CURRENT OR QUIT <15Y AGO -  not yet done  # Prostate - SCREEN AGE 56 HI-RISK, 50 OTW -- Q2-4Y - not yet done    Cardiovascular disease  # The 10-year ASCVD risk score Denman George DC Jr., et al., 2013) is: 11.6%    Immunization History   Administered Date(s) Administered   ??? INFLUENZA TIV (TRI) PF (IM) 06/08/2013   ??? Influenza Vaccine Quad (IIV4 PF) 68mo+ injectable 06/21/2014, 03/27/2015, 07/16/2016, 05/20/2017   ??? PNEUMOCOCCAL POLYSACCHARIDE 23 09/14/2013   ??? Pneumococcal Conjugate 13-Valent 06/27/2013   ??? TdaP 09/14/2013     ?? Screening ordered today: none  ?? Immunizations ordered today: influenza    Preventative health  # Needs dental care - has had evaluation for full extraction but very expensive out of pocket. Would like to get another estimate from dental clinic.    Counseling services took more than 50% of today's visit time.    Disposition  Return to clinic 5-6 months or sooner if needed.    Amparo Bristol, MD, MPH   [facility]   [address], 1st floor   [place], [place]. [address]   Phone: [phone]   Fax: [phone]        Subjective:      Chief Complaint   HIV followup    HPI  Return patient visit for Timothy Castro, a 47 y.o. male.   Lots of stress this fall - grandmother died, then broke up with partner Soji in October 2019 - still friends   Palpitations started 05/06/18 soon after his breakup - diagnosed with NSVT via several ED visits and and brief hospitalizations. Now on flecainide and metoprolol.   Has a very stressful job which he acknowledges as part of the problem but happy with the pay. Looking at other options.    Past Medical History:   Diagnosis Date   ??? HIV (human immunodeficiency virus infection) (CMS-HCC) 03/23/2013    HLA-B*5701 negative   ??? IBS (irritable bowel syndrome)    ??? Smoker      Medications and Allergies   Reviewed and updated today. See bottom of this visit's encounter summary for details.  Current Outpatient Medications on File Prior to Visit   Medication Sig   ??? elvitegravir-cobicistat-emtricitabine-tenofovir (GENVOYA) 150-150-200-10 mg Tab tablet Take 1 tablet by mouth daily.   ??? flecainide (TAMBOCOR) 100 MG tablet Take 2 tablets by mouth daily.   ??? metoprolol succinate (TOPROL-XL) 50 MG 24 hr tablet Take 25 mg by mouth two (2) times a day.     No current facility-administered medications on file prior to visit.      No Known Allergies    Social History  Social History     Tobacco Use   ??? Smoking status: Current Every Day Smoker     Packs/day: 1.00     Types: Cigarettes     Start date: 06/09/1991   ??? Smokeless tobacco: Former Neurosurgeon   ??? Tobacco comment: down to 4 cigs a day   Substance Use Topics   ??? 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  Broke up with partner recently. Patient is versatile.  Smoking 1 ppd.     Review of Systems  As per HPI. Remainder of 10 systems reviewed, negative.      Objective:      BP 118/74  - Pulse 74  - Temp 36.8 ??C (98.2 ??F) (Oral)  - Resp  18  - Ht 177.8 cm (5' 10)  - Wt 83.9 kg (185 lb)  - BMI 26.54 kg/m??     Const looks well and attentive, alert, appropriate   Eyes sclerae anicteric, noninjected OU   ENT dentition good and no thrush, leukoplakia or oral lesions   Lymph no cervical or supraclavicular LAD   CV RRR. No murmurs. No rub or gallop. S1/S2.   Resp CTAB ant/post, normal work of breathing   GI flat, soft. NTND. NABS.   GU deferred   Rectal deferred   Skin no petechiae, ecchymoses or obvious rashes on clothed exam.    MSK no joint tenderness and normal ROM throughout   Neuro CN II-XII grossly intact, MAEE, non focal   Psych Appropriate affect. Eye contact good. Linear thoughts. Fluent speech.     Laboratory Data  Reviewed in Epic today, using Synopsis and Chart Review filters.    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

## 2018-06-23 NOTE — Unmapped (Signed)
It was great to see you today.    Contacting us   During working hours  [phone]  After hours or weekends [phone] and ask for the ID doctor on call  Fax number   [phone]    MEDICATIONS  For refills, please contact your pharmacy and ask them to electronically send or fax the request to the clinic.     Please bring all medications in original bottles to every appointment.    [facility] (formerly [facility]) or [employer] Eligibility (required even if you do not receive medication through [facility])  Please remember to renew your Juanell Fairly eligibility during renewal periods which occur twice a year: January-March and July-September.     The following are needed for each renewal:   - [facility] Identification (if you don't have one, then a bill with your name and address in [place])   - proof of income (award letter, W-2, or last three check stubs)   If you are unable to come in for renewal, let us know if we can mail, fax or e-mail paperwork to you.   [facility] Contact: [phone]      Urgent Care Clinic  Monday, Tuesday, and Thursday from 8:30 - 12 noon  Please call ahead to speak with the nursing staff if you think you need to be seen urgently!    Lab info:  Your most recent CD4 T-cell counts and viral loads are below. Here are a few things to keep in mind when looking at your numbers:  ?? For most people, we're checking CD4 counts fairly infrequently (once a year or less)  ?? It's normal for your CD4 count to be different from visit to visit.   ?? We consider your viral load to be undetectable if it says <40 or if it says Not detected.  ?? Our goal is to get your virus to be undetectable and keep it undetectable. You can help by taking your medications at about the same time, every single day. If you're having trouble with taking your medications, it's important to let us know.    YOUR RECENT LAB RESULTS:  CD4 and VIRAL LOAD  Lab Results   Component Value Date    ACD4 760 12/05/2015    HIVRS Detected (A) 12/23/2017

## 2018-06-23 NOTE — Unmapped (Signed)
[facility] Special[facility] Medication Referral: No PA required    Medication (Brand/Generic): BIKTARVY    Initial Benefits Investigation Claim completed with resulted information below:  No PA required  Patient ABLE to fill at [facility]  Insurance Company:  [facility]  Anticipated Copay: $0    As Co-pay is under $25 defined limit, per policy there will be no further investigation of need for financial assistance at this time unless patient requests. This referral has been communicated to the provider and handed off to the [facility] team for further processing and filling of prescribed medication.   ______________________________________________________________________  Please utilize this referral for viewing purposes as it will serve as the central location for all relevant documentation and updates.

## 2018-06-23 NOTE — Unmapped (Signed)
1507 Patient offered flu vaccine. Patient declined at this time. Patient offered STI screening. Patient declined at this time.  1544 after patient speaking with MD, patient wanted flu vacc. Flu vacc administered and documented.

## 2018-06-24 NOTE — Unmapped (Signed)
[facility]   Patient Onboarding/Medication Counseling    Timothy Castro is a 47 y.o. male with  HIV who I am counseling today on initiation of therapy.    Medication: Susanne Borders      Verified patient's date of birth / HIPAA.      Education Provided: ?    Dose/Administration discussed: Takes one tablet by mouth daily    This medication should be taken  without regard to food.  Stressed the importance of taking medication as prescribed and to contact provider if that changes at any time.      Discussed missed dose instructions.    Storage requirements: this medicine should be stored at room temperature.     Side effects / precautions discussed:    Discussed common side effects:  including diarrhea, upset stomach, and headache.    If patient experiences any of the following:     1. signs of an allergic reaction (rash; hives; itching; red, swollen, blistered, or peeling skin with or without fever; wheezing; tightness in the chest or throat; trouble breathing, swallowing, or talking; unusual hoarseness; or swelling of the mouth, face, lips, tongue, or throat),     2. signs of kidney problems (unable to pass urine, change in how much urine is passed, blood in the urine, or a big weight gain), signs of liver problems (dark urine, feeling tired, not hungry, upset stomach or stomach pain, light-colored stools, throwing up, or yellow skin or eyes),    3. signs of lactic acidosis (fast breathing, fast heartbeat, a heartbeat that does not feel normal, very bad upset stomach or throwing up, feeling very sleepy, shortness of breath, feeling very tired or weak, very bad dizziness, feeling cold, or muscle pain,      they need to call the doctor.      Patient will receive a drug information handout with shipment.    Handling precautions / disposal reviewed:  n/a.    Drug Interactions: other medications reviewed and up to date in Epic.  No drug interactions identified.        Comorbidities/Allergies: reviewed and up to date in Epic.    Verified therapy is appropriate and should continue      Delivery Information    Medication Assistance provided: none    Anticipated copay of $0 reviewed with patient. Verified delivery address in Epic.    Scheduled delivery date: 07/02/18  Medication will be delivered via Next Day Courier to the home address in [place].  This shipment will not require a signature.      Explained the services we provide at [facility] and that each month we would call to set up refills.  Stressed importance of returning phone calls so that we could ensure they receive their medications in time each month.  Informed patient that we should be setting up refills 7-10 days prior to when they will run out of medication.  Informed patient that welcome packet will be sent.      Patient verbalized understanding of the above information as well as how to contact the pharmacy at [phone] option 4 with any questions/concerns.  The pharmacy is open Monday through Friday 8:30am-4:30pm.  A pharmacist is available 24/7 via pager to answer any clinical questions they may have.        Patient Specific Needs      ? Patient has no physical, cognitive, or cultural barriers.    ? Patient prefers to have medications  discussed with  Patient     ? Patient is able to read and understand education materials at a high school level or above.    ? Patient's primary language is  English             Timothy Castro  [facility] Specialty Pharmacist

## 2018-06-28 LAB — HIV RNA, QUANTITATIVE, PCR: HIV RNA QNT RSLT: NOT DETECTED

## 2018-06-28 LAB — HIV RNA COMMENT: Lab: 0

## 2018-06-29 MED FILL — BIKTARVY 50 MG-200 MG-25 MG TABLET: 30 days supply | Qty: 30 | Fill #0 | Status: AC

## 2018-07-14 DIAGNOSIS — B20 Human immunodeficiency virus [HIV] disease: Secondary | ICD-10-CM | POA: Insufficient documentation

## 2018-07-14 NOTE — Progress Notes (Deleted)
NO SHOW

## 2018-07-15 ENCOUNTER — Encounter

## 2018-07-15 ENCOUNTER — Ambulatory Visit: Payer: BLUE CROSS/BLUE SHIELD | Admitting: Cardiovascular Disease

## 2018-07-16 ENCOUNTER — Encounter: Payer: Self-pay | Admitting: Cardiovascular Disease

## 2018-08-02 NOTE — Unmapped (Signed)
[facility] Refill Coordination Note  Medication: Biktarvy  Unable to reach patient to schedule shipment for medication being filled at [facility]. Left voicemail on phone.  As this is the 3rd unsuccessful attempt to reach the patient, no additional phone call attempts will be made at this time.      Phone numbers attempted: [phone]  Last scheduled delivery: 06/29/2018    Please call the [facility] at [phone] (option 4) should you have any further questions.      Thanks,  [facility] Specialty Team

## 2018-08-10 NOTE — Unmapped (Signed)
HIV PharmD called this patient at the request of [facility] pharmacy staff. Pharmacy staff have not ben able to contact patient in order to schedule medication delivery. HIV PharmD called and was able to reach the patient. I assessed the following:    1. Reason for not responding to [facility]: Patient works the third shift and was not available to receive [facility] phone calls. Patient states that it took a while for him to receive his Biktarvy and had enough supply, so he did not call back.   2. Missed doses: 0  3. Quantity on-hand: Half of a bottle of Biktarvy  4. Any other medication related issues: None    Pt will call [facility] pharmacy to schedule a medication delivery when he is in need of a refill.     Of note, the patient's last appointment at [facility] was 06/23/18 with Dr. Rosemarie Beath. He does have a follow-up appointment scheduled for 10/27/18. Will forward this note to her provider for FYI.     Time Spent During Encounter: 15 minutes    S. Louie Bun, PharmD  PGY-2 Infectious Diseases Resident     Discussed with Dr. Marlene Bast and agree with the above. No further intervention at this time.    Tonie Griffith, PharmD, BCPS, AAHIVP, CPP  Infectious Disease Clinical Pharmacy Practitioner   [facility]   Direct line: [phone]

## 2018-08-18 NOTE — Unmapped (Signed)
[facility] Refill and Clinical Coordination Note  Medication(s): Biktarvy 50-200-25mg     Timothy Castro, DOB: [birth year]  Phone: [phone] (home) , Alternate phone contact: N/A  Shipping address: [address]  [place] [address]  Phone or address changes today?: No  All above HIPAA information verified.  Insurance changes? No    Completed refill and clinical call assessment today to schedule patient's medication shipment from the [facility] [phone]).      MEDICATION RECONCILIATION    Confirmed the medication and dosage are correct and have not changed: Yes, regimen is correct and unchanged.    Were there any changes to your medication(s) in the past month:  No, there are no changes reported at this time.    ADHERENCE    Is this medicine transplant or covered by Medicare Part B? No.    Did you miss any doses in the past 4 weeks? No missed doses reported.  Adherence counseling provided? Not needed     SIDE EFFECT MANAGEMENT    Are you tolerating your medication?:  Timothy Castro reports tolerating the medication.  Side effect management discussed: None      Therapy is appropriate and should be continued.    Evidence of clinical benefit: See Epic note from 06/23/18      FINANCIAL/SHIPPING    Delivery Scheduled: Yes, Expected medication delivery date: 08/19/2018     Medication will be delivered via Next Day Courier to the home address in [place].    Additional medications refilled: No additional medications/refills needed at this time.    The patient will receive a drug information handout for each medication shipped and additional FDA Medication Guides as required.      Timothy Castro did not have any additional questions at this time.    Delivery address confirmed in Epic.     We will follow up with patient monthly for standard refill processing and delivery.      Thank you,  Lupita Shutter   [facility] Specialty Pharmacist

## 2018-08-18 NOTE — Unmapped (Signed)
Timothy Castro 's BIKTARVY shipment will be delayed due to Cost Increase We have contacted the patient and communicated the delivery change to patient/caregiver We will call the patient to reschedule the delivery upon resolution. We have confirmed the delivery date as N/A .

## 2018-08-19 MED FILL — BIKTARVY 50 MG-200 MG-25 MG TABLET: 30 days supply | Qty: 30 | Fill #1 | Status: AC

## 2018-08-19 MED FILL — BIKTARVY 50 MG-200 MG-25 MG TABLET: ORAL | 30 days supply | Qty: 30 | Fill #1

## 2018-08-19 NOTE — Unmapped (Signed)
Timothy Castro called back about the delivery for BIKTARVY 50-200-25 mg tablet (bictegrav-emtricit-tenofov ala) and would like the delivery to ship out 02/13 via UPS or [employer] to be delivered 02/14 . We have confirmed the delivery via courier.

## 2018-09-21 MED FILL — BIKTARVY 50 MG-200 MG-25 MG TABLET: 30 days supply | Qty: 30 | Fill #2 | Status: AC

## 2018-09-21 MED FILL — BIKTARVY 50 MG-200 MG-25 MG TABLET: ORAL | 30 days supply | Qty: 30 | Fill #2

## 2018-09-21 NOTE — Unmapped (Signed)
[facility] Refill Coordination Note    Specialty Medication(s) to be Shipped:   Infectious Disease: Biktarvy    Other medication(s) to be shipped: n/a     Timothy Castro, DOB: [birth year]  Phone: [phone] (home)       All above HIPAA information was verified with patient.     Completed refill call assessment today to schedule patient's medication shipment from the [facility] [phone]).       Specialty medication(s) and dose(s) confirmed: Regimen is correct and unchanged.   Changes to medications: Timothy Castro reports no changes reported at this time.  Changes to insurance: No  Questions for the pharmacist: No    Confirmed patient received Welcome Packet with first shipment. The patient will receive a drug information handout for each medication shipped and additional FDA Medication Guides as required.       DISEASE/MEDICATION-SPECIFIC INFORMATION        N/A    SPECIALTY MEDICATION ADHERENCE     Medication Adherence    Patient reported X missed doses in the last month:  0  Specialty Medication:  Biktarvy 50-200-25mg   Patient is on additional specialty medications:  No  Patient is on more than two specialty medications:  No  Any gaps in refill history greater than 2 weeks in the last 3 months:  no  Demonstrates understanding of importance of adherence:  yes  Informant:  patient                Biktarvy 50-200-25mg : Patient has 2 days of medication on hand       SHIPPING     Shipping address confirmed in Epic.     Delivery Scheduled: Yes, Expected medication delivery date: 09/22/18.     Medication will be delivered via Next Day Courier to the home address in [place].    Timothy Castro   [facility] Specialty Technician

## 2018-10-14 NOTE — Unmapped (Signed)
[facility] Refill Coordination Note    Specialty Medication(s) to be Shipped:   Infectious Disease: Biktarvy    Other medication(s) to be shipped: N/A     Lawrence Santiago, DOB: 06-09-1971  Phone: [phone] (home)       All above HIPAA information was verified with patient.     Completed refill call assessment today to schedule patient's medication shipment from the [facility] [phone]).       Specialty medication(s) and dose(s) confirmed: Regimen is correct and unchanged.   Changes to medications: Huck reports no changes reported at this time.  Changes to insurance: No  Questions for the pharmacist: No    Confirmed patient received Welcome Packet with first shipment. The patient will receive a drug information handout for each medication shipped and additional FDA Medication Guides as required.       DISEASE/MEDICATION-SPECIFIC INFORMATION        N/A    SPECIALTY MEDICATION ADHERENCE     Medication Adherence    Patient reported X missed doses in the last month:  0  Specialty Medication:  BIKTARVY                BIKTARVY  : 7 days of medicine on hand         SHIPPING     Shipping address confirmed in Epic.     Delivery Scheduled: Yes, Expected medication delivery date: 4/14.     Medication will be delivered via Next Day Courier to the home address in Epic WAM.    [name]   [facility] Specialty Technician

## 2018-10-18 MED FILL — BIKTARVY 50 MG-200 MG-25 MG TABLET: ORAL | 30 days supply | Qty: 30 | Fill #3

## 2018-10-18 MED FILL — BIKTARVY 50 MG-200 MG-25 MG TABLET: 30 days supply | Qty: 30 | Fill #3 | Status: AC

## 2018-10-27 ENCOUNTER — Institutional Professional Consult (permissible substitution): Admit: 2018-10-27 | Discharge: 2018-10-28

## 2018-10-27 NOTE — Unmapped (Signed)
Assessment/Plan:      Timothy Castro, a 48 y.o. male seen today for routine HIV followup.    Plan:  HIV  ?? Doing well overall. Denies missed or late doses of TAF/FTC/BIC.   ?? Will need to ensure UTD RW eligibility given changes in job status.    Lab Results   Component Value Date    ACD4 760 12/05/2015    CD4 36 12/05/2015    HIVCP <40 (H) 12/23/2017    HIVRS Not Detected 06/23/2018     ?? Continue current therapy.  ?? Checking HIV RNA & safety labs (brief return) when able to visit clinic (deferred today)  ?? Encouraged continued excellent ARV adherence.    Anal dysplasia  ?? 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  ?? Was supposed to have repeat anoscopy 01/2018 - will work to reschedule    Tobacco use  ?? Counseled again today re smoking cessation. Very difficult right now.  ?? He is very aware of need for this because of cost and family hx - his dad died of lung ca at age 42 and was a smoker. Also motivated by his dental issues but stress impedes progress.  ?? Reviewed recommendations for cessation and available resources. Willing to discuss with our pulmonologist Dr. Sallyanne Kuster. Referral made.    Sexual health & secondary prevention  Sex with men. No current partner.    Lab Results   Component Value Date    RPR Nonreactive 12/23/2017    CTNAA Negative 12/23/2017    CTNAA Negative 12/23/2017    CTNAA Negative 12/23/2017    GCNAA Negative 12/23/2017    GCNAA Negative 12/23/2017    GCNAA Negative 12/23/2017    SPECTYPE Swab 12/23/2017    SPECTYPE Swab 12/23/2017    SPECTYPE Urine 12/23/2017    SPECSOURCE Rectum 12/23/2017    SPECSOURCE Throat 12/23/2017    SPECSOURCE Urine 12/23/2017     ?? GC/CT NAATs -- needed but deferred to future visit  ?? 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)    B: Rectum, posterior, biopsy  - Rectal mucosa with lymphoid nodule (1 fragment)  - No squamous mucosa present for evaluation       Communicable diseases  # TB - no longer needed; negative IGRA 09/14/13 and low/no risk  # HCV - negative 07/2016; rescreen w/Ab q1-2y    Cancer screening  # Anorectal - LGSIL with AIN1 on bx 01/2017, for repeat anoscopy 01/2018 - missed appt  # Colorectal - not yet needed  # Liver - not applicable  # Lung - SCREEN AGE 37-80 IF >30 PY & CURRENT OR QUIT <15Y AGO - not yet done  # Prostate - SCREEN AGE 38 HI-RISK, 50 OTW -- Q2-4Y - not yet done    Cardiovascular disease  # The 10-year ASCVD risk score Denman George DC Jr., et al., 2013) is: 11.6%    Immunization History  Administered Date(s) Administered   ??? INFLUENZA TIV (TRI) PF (IM) 06/08/2013   ??? Influenza Vaccine Quad (IIV4 PF) 60mo+ injectable 06/21/2014, 03/27/2015, 07/16/2016, 05/20/2017, 06/23/2018   ??? PNEUMOCOCCAL POLYSACCHARIDE 23 09/14/2013   ??? Pneumococcal Conjugate 13-Valent 06/27/2013   ??? TdaP 09/14/2013     ?? Screening ordered today: none  ?? Immunizations ordered today: none    Preventative health  # Needs dental care - has had evaluation for full extraction but very expensive out of pocket. Would like to get another estimate from dental clinic.    I spent 20 minutes on the phone with the patient. I spent an additional 10 minutes on pre- and post-visit activities.     The patient was physically located in [place] or a state in which I am permitted to provide care. The patient understood that s/he may incur co-pays and cost sharing, and agreed to the telemedicine visit. The visit was completed via phone and/or video, which was appropriate and reasonable under the circumstances given the patient's presentation at the time.    The patient has been advised of the potential risks and limitations of this mode of treatment (including, but not limited to, the absence of in-person examination) and has agreed to be treated using telemedicine. The patient's/patient's family's questions regarding telemedicine have been answered.     If the phone/video visit was completed in an ambulatory setting, the patient has also been advised to contact their provider???s office for worsening conditions, and seek emergency medical treatment and/or call 911 if the patient deems either necessary.    Disposition  Return to clinic 5-6 months or sooner if needed.    Amparo Bristol, MD, MPH   [facility]   [address] 1st floor   [place], [place]. [address]   Phone: [phone]   Fax: [phone]      Subjective:      Chief Complaint   HIV followup    HPI  Return patient visit for Timothy Castro, a 48 y.o. male.   Laid off due to coronavirus, not sure if this is temporary.  Looking at other job opportunities.     Past Medical History:   Diagnosis Date   ??? HIV (human immunodeficiency virus infection) (CMS-HCC) 03/23/2013    HLA-B*5701 negative   ??? IBS (irritable bowel syndrome)    ??? Smoker      Medications and Allergies   Reviewed and updated today. See bottom of this visit's encounter summary for details.  Current Outpatient Medications on File Prior to Visit   Medication Sig   ??? bictegrav-emtricit-tenofov ala (BIKTARVY) 50-200-25 mg tablet Take 1 tablet by mouth daily.   ??? flecainide (TAMBOCOR) 100 MG tablet Take 2 tablets by mouth daily.   ??? metoprolol succinate (TOPROL-XL) 50 MG 24 hr tablet Take 25 mg by mouth two (2) times a day.     No current facility-administered medications on file prior to visit.      No Known Allergies    Social History  Social History     Tobacco Use   ??? Smoking status: Current Every Day Smoker     Packs/day: 1.00     Types: Cigarettes     Start date: 06/09/1991   ??? Smokeless tobacco: Former Neurosurgeon ??? Tobacco comment: down to 4 cigs a day   Substance Use Topics   ??? 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  Broke up with partner recently. Patient is versatile.  Smoking 1 ppd.     Review of Systems  As per HPI. Remainder of 10 systems reviewed, negative.      Objective:      There were no vitals taken for this visit.    Phone visit    Laboratory Data  Reviewed in Epic today, using Synopsis and Chart Review filters.    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

## 2018-11-15 NOTE — Unmapped (Signed)
[facility] Refill Coordination Note  Medication: BIKTARVY    Unable to reach patient to schedule shipment for medication being filled at [facility]. Left voicemail on phone.  As this is the 2ND unsuccessful attempt to reach the patient, no additional phone call attempts will be made at this time.      Phone numbers attempted: [phone]  Last scheduled delivery: SHIPPED 4/13    Please call the [facility] at [phone] (option 4) should you have any further questions.      Thanks,  [facility] Specialty Team

## 2018-11-15 NOTE — Unmapped (Signed)
[facility] Refill Coordination Note    Specialty Medication(s) to be Shipped:   Infectious Disease: Biktarvy    Other medication(s) to be shipped: N/A     Lawrence Santiago, DOB: 02/13/1971  Phone: [phone] (home)       All above HIPAA information was verified with patient.     Completed refill call assessment today to schedule patient's medication shipment from the [facility] [phone]).       Specialty medication(s) and dose(s) confirmed: Regimen is correct and unchanged.   Changes to medications: Gabino reports no changes at this time.  Changes to insurance: No  Questions for the pharmacist: No    Confirmed patient received Welcome Packet with first shipment. The patient will receive a drug information handout for each medication shipped and additional FDA Medication Guides as required.       DISEASE/MEDICATION-SPECIFIC INFORMATION        N/A    SPECIALTY MEDICATION ADHERENCE     Medication Adherence    Patient reported X missed doses in the last month:  0  Specialty Medication:  BIKTARVY                BIKTARVY  : 7 days of medicine on hand         SHIPPING     Shipping address confirmed in Epic.     Delivery Scheduled: Yes, Expected medication delivery date: 5/14.     Medication will be delivered via Next Day Courier to the home address in Epic WAM.    [name]   [facility] Specialty Technician

## 2018-11-17 NOTE — Unmapped (Signed)
Timothy Castro 's BIKTARVY 50-200-25 mg tablet (bictegrav-emtricit-tenofov ala)  shipment will be delayed due to Insurance has been terminated We have contacted the patient and left a message We will wait for a call back from the patient/caregiver to reschedule the delivery.  We have confirmed the delivery date as NA .

## 2018-11-26 MED ORDER — BICTEGRAVIR 50 MG-EMTRICITABINE 200 MG-TENOFOVIR ALAFENAM 25 MG TABLET
ORAL_TABLET | Freq: Every day | ORAL | 11 refills | 0 days | Status: CP
Start: 2018-11-26 — End: 2018-11-30

## 2018-11-27 NOTE — Unmapped (Signed)
Patient sent below email. I called him to arrange for rx for Biktarvy to be [facility] to [facility] [place] - he has a [place] patient assistance card and will try to fill using this. If not, on Tuesday we will update RW eligibility and work out med access--    From: [email or web address]  Date: Friday, Nov 26, 2018 at 5:33 PM  To: Amparo Bristol [email or web address]  Subject: Timothy Castro    Hi Dr. Rosemarie Beath, I am a patient of yours in desperate need of your help. As you may know my job ended and so did my insurance. Now caught between jobs and insurance coverage, my prescription has now run out. The co-pay being $4000 is a tad steep for me. Keyana from [facility] pharmacy was working on this with the manufacturer for the last week but with no luck as of today. I tried contacting her today with no response as she left as I was speaking to [place] in your office. Tresa Endo could not do anything either. Once I hung up, I realized I do have a co-pay card from [place] and contacted them to get the information which I do have now. Not sure where the card went. This does me no good now though since [facility] pharmacy is now closed for the weekend. I will have to switch back over from [facility] to [facility] because of this issue and transfer my prescription. If at all possible and if you get this message soon, I need to do this so I can continue my meds without too much of a gap.     Thanks Again,     Timothy Castro  [phone]

## 2018-11-30 MED ORDER — BICTEGRAVIR 50 MG-EMTRICITABINE 200 MG-TENOFOVIR ALAFENAM 25 MG TABLET
ORAL_TABLET | Freq: Every day | ORAL | 11 refills | 30.00000 days | Status: CP
Start: 2018-11-30 — End: ?
  Filled 2018-12-01: qty 30, 30d supply, fill #0

## 2018-11-30 NOTE — Unmapped (Signed)
Patient had sent mychart message that he had lost health insurance. Detailed msg in chart 11/26/18.     Called [employer] and learned that they had received the application on 11/25/18, but it still had not been processed. However, they processed it for me while I was on the phone.     [facility] billing information:   ID# [id number]  BIN# [id number]  PCN# [id number]  Group# [id number]    Called pt and left him a voicemail letting him know I was calling regarding him losing health insurance. Left pt a voicemail requesting that he return my call.     Sherene Sires    Time Duration of intervention in minutes: 5 mins

## 2018-12-01 MED FILL — BIKTARVY 50 MG-200 MG-25 MG TABLET: 30 days supply | Qty: 30 | Fill #0 | Status: AC

## 2018-12-01 NOTE — Unmapped (Signed)
[facility] Refill Coordination Note    Specialty Medication(s) to be Shipped:   Infectious Disease: Biktarvy    Other medication(s) to be shipped:     Timothy Castro, DOB: 10/11/1970  Phone: [phone] (home)       All above HIPAA information was verified with patient.     Completed refill call assessment today to schedule patient's medication shipment from the [facility] [phone]).       Specialty medication(s) and dose(s) confirmed: Regimen is correct and unchanged.   Changes to medications: Jden reports no changes at this time.  Changes to insurance: No  Questions for the pharmacist: No    Confirmed patient received Welcome Packet with first shipment. The patient will receive a drug information handout for each medication shipped and additional FDA Medication Guides as required.       DISEASE/MEDICATION-SPECIFIC INFORMATION        N/A    SPECIALTY MEDICATION ADHERENCE     Medication Adherence    Patient reported X missed doses in the last month:  5  Specialty Medication:  biktarvy  Patient is on additional specialty medications:  No                bitarvy 50-200-25 mg: 0 days of medicine on hand         SHIPPING     Shipping address confirmed in Epic.     Delivery Scheduled: Yes, Expected medication delivery date: 12/02/18.     Medication will be delivered via Next Day Courier to the home address in [place].    Oralia Rud   [facility] Specialty Technician

## 2018-12-01 NOTE — Unmapped (Signed)
Called patient to notify him that we have manufacturer assistance available for him and that we can schedule a delivery for him ASAP.  His copay will be $0.00.  Left a voicemail for patient to call back so we can schedule shipment.    Corliss Skains. [place], [place].[facility]  Specialty Pharmacist  [phone] option 4

## 2019-01-03 NOTE — Unmapped (Signed)
The [facility] has made a third and final attempt to reach this patient to refill the following medication:BIKTARVY.      We have left voicemails on the following phone numbers: [phone].    Dates contacted: 6/19,6/23,6/29  Last scheduled delivery: SHIPPED 5/27    The patient may be at risk of non-compliance with this medication. The patient should call the [facility] at [phone] (option 4) to refill medication.    [name]   [facility] Specialty Technician

## 2019-01-08 ENCOUNTER — Other Ambulatory Visit: Payer: Self-pay

## 2019-01-08 ENCOUNTER — Emergency Department
Admission: EM | Admit: 2019-01-08 | Discharge: 2019-01-08 | Disposition: A | Discharge status: Home / Self Care | Payer: BLUE CROSS/BLUE SHIELD | Attending: Emergency Medicine | Admitting: Emergency Medicine

## 2019-01-08 ENCOUNTER — Encounter: Payer: Self-pay | Admitting: Emergency Medicine

## 2019-01-08 DIAGNOSIS — R079 Chest pain, unspecified: Secondary | ICD-10-CM | POA: Diagnosis not present

## 2019-01-08 DIAGNOSIS — R002 Palpitations: Secondary | ICD-10-CM | POA: Diagnosis not present

## 2019-01-08 DIAGNOSIS — F17210 Nicotine dependence, cigarettes, uncomplicated: Secondary | ICD-10-CM | POA: Insufficient documentation

## 2019-01-08 DIAGNOSIS — Z21 Asymptomatic human immunodeficiency virus [HIV] infection status: Secondary | ICD-10-CM | POA: Insufficient documentation

## 2019-01-08 LAB — CBC WITH DIFFERENTIAL/PLATELET
Abs Immature Granulocytes: 0.03 10*3/uL (ref 0.00–0.07)
Basophils Absolute: 0.1 10*3/uL (ref 0.0–0.1)
Basophils Relative: 1 %
Eosinophils Absolute: 0.3 10*3/uL (ref 0.0–0.5)
Eosinophils Relative: 4 %
HCT: 40.5 % (ref 39.0–52.0)
Hemoglobin: 13.6 g/dL (ref 13.0–17.0)
Immature Granulocytes: 0 %
Lymphocytes Relative: 37 %
Lymphs Abs: 2.7 10*3/uL (ref 0.7–4.0)
MCH: 32.5 pg (ref 26.0–34.0)
MCHC: 33.6 g/dL (ref 30.0–36.0)
MCV: 96.7 fL (ref 80.0–100.0)
Monocytes Absolute: 0.6 10*3/uL (ref 0.1–1.0)
Monocytes Relative: 7 %
Neutro Abs: 3.7 10*3/uL (ref 1.7–7.7)
Neutrophils Relative %: 51 %
Platelets: 192 10*3/uL (ref 150–400)
RBC: 4.19 MIL/uL — ABNORMAL LOW (ref 4.22–5.81)
RDW: 11.9 % (ref 11.5–15.5)
WBC: 7.4 10*3/uL (ref 4.0–10.5)
nRBC: 0 % (ref 0.0–0.2)

## 2019-01-08 LAB — BASIC METABOLIC PANEL
Anion gap: 10 (ref 5–15)
BUN: 12 mg/dL (ref 6–20)
CO2: 23 mmol/L (ref 22–32)
Calcium: 9.5 mg/dL (ref 8.9–10.3)
Chloride: 105 mmol/L (ref 98–111)
Creatinine, Ser: 1.27 mg/dL — ABNORMAL HIGH (ref 0.61–1.24)
GFR calc Af Amer: >60 mL/min (ref >60)
GFR calc non Af Amer: >60 mL/min (ref >60)
Glucose, Bld: 116 mg/dL — ABNORMAL HIGH (ref 70–99)
Potassium: 4.1 mmol/L (ref 3.5–5.1)
Sodium: 138 mmol/L (ref 135–145)

## 2019-01-08 LAB — TROPONIN I (HIGH SENSITIVITY): Troponin I (High Sensitivity): 4 ng/L (ref <18)

## 2019-01-08 NOTE — ED Provider Notes (Addendum)
[facility] Emergency Department Provider Note   ____________________________________________   I have reviewed the triage vital signs and the nursing notes.   HISTORY  Chief Complaint Palpitations   History limited by: Not Limited   HPI Jose George is a 48 y.o. male who presents to the emergency department today because of concerns for heart palpitations, some chest pain and feeling unwell.  Patient states the symptoms started while he was outside working.  He does think that he might of exerted himself too much.  He has been having some issues with chest pain recently which he thinks is related to acid reflux.  These have improved since he started an antacid.  The patient does feel better at the time of my exam.  He denies any recent fevers.  Records reviewed. Per medical record review patient has a history of non sustained ventricular tachycardia.   Past Medical History:  Diagnosis Date  . HIV (human immunodeficiency virus infection) [facility])     Patient Active Problem List   Diagnosis Date Noted  . HIV (human immunodeficiency virus infection) ([place]) 07/14/2018  . Nonsustained ventricular tachycardia ([place]) 05/11/2018    History reviewed. No pertinent surgical history.  Prior to Admission medications   Medication Sig Start Date End Date Taking? Authorizing Provider  acetaminophen (TYLENOL) 500 MG tablet Take 500 mg by mouth every 6 (six) hours as needed for headache.    [provider]  elvitegravir-cobicistat-emtricitabine-tenofovir (GENVOYA) 150-150-200-10 MG TABS tablet Take 1 tablet by mouth daily with breakfast.    [provider]  nadolol (CORGARD) 20 MG tablet Take 1 tablet (20 mg total) by mouth daily. 05/13/18   Cheryln Manly, NP    Allergies Patient has no known allergies.  No family history on file.  Social History Social History   Tobacco Use  . Smoking status: Current Every Day Smoker    Packs/day: 0.10     Types: Cigarettes  . Smokeless tobacco: Never Used  Substance Use Topics  . Alcohol use: No  . Drug use: Not on file    Review of Systems Constitutional: No fever/chills Eyes: No visual changes. ENT: Congestion earlier in the week.  Cardiovascular: Positive for chest pain. Positive for palpitations.  Respiratory: Denies shortness of breath. Gastrointestinal: No abdominal pain.  No nausea, no vomiting.  No diarrhea.   Genitourinary: Negative for dysuria. Musculoskeletal: Negative for back pain. Skin: Negative for rash. Neurological: Negative for headaches, focal weakness or numbness.  ____________________________________________   PHYSICAL EXAM:  VITAL SIGNS: ED Triage Vitals  Enc Vitals Group     BP 01/08/19 1700 136/79     Pulse Rate 01/08/19 1700 90     Resp 01/08/19 1700 16     Temp 01/08/19 1700 98.8 F (37.1 C)     Temp src --      SpO2 01/08/19 1700 93 %     Weight 01/08/19 1701 190 lb (86.2 kg)     Height 01/08/19 1701 5\' 10"  (1.778 m)     Head Circumference --      Peak Flow --      Pain Score 01/08/19 1701 0   Constitutional: Alert and oriented.  Eyes: Conjunctivae are normal.  ENT      Head: Normocephalic and atraumatic.      Nose: No congestion/rhinnorhea.      Mouth/Throat: Mucous membranes are moist.      Neck: No stridor. Hematological/Lymphatic/Immunilogical: No cervical lymphadenopathy. Cardiovascular: Normal rate, regular rhythm.  No murmurs,  rubs, or gallops.  Respiratory: Normal respiratory effort without tachypnea nor retractions. Breath sounds are clear and equal bilaterally. No wheezes/rales/rhonchi. Gastrointestinal: Soft and non tender. No rebound. No guarding.  Genitourinary: Deferred Musculoskeletal: Normal range of motion in all extremities. Neurologic:  Normal speech and language. No gross focal neurologic deficits are appreciated.  Skin:  Skin is warm, dry and intact. No rash noted. Psychiatric: Mood and affect are normal. Speech  and behavior are normal. Patient exhibits appropriate insight and judgment.  ____________________________________________    LABS (pertinent positives/negatives)  Trop 4 BMP wnl except glu 116, cr 1.27 CBC wbc 7.4, hgb 13.6, plt 192  ____________________________________________   EKG  I, Phineas SemenGraydon Donaciano Range, attending physician, personally viewed and interpreted this EKG  EKG Time: 1656 Rate: 92 Rhythm: normal sinus rhythm Axis: normal Intervals: qtc 447 QRS: incomplete RBBB ST changes: no st elevation Impression: abnormal ekg   ____________________________________________    RADIOLOGY  None  ____________________________________________   PROCEDURES  Procedures  ____________________________________________   INITIAL IMPRESSION / ASSESSMENT AND PLAN / ED COURSE  Pertinent labs & imaging results that were available during my care of the patient were reviewed by me and considered in my medical decision making (see chart for details).   Patient presented to the emergency department today because of concerns for some palpitations and chest pain that occurred while he was exerting himself outside.  Patient blood work with no concerning elevation of troponin.  The patient's creatinine was minimally elevated over his baseline which might represent slight dehydration.  Patient did feel better at the time of my exam.  This time I do think is reasonable for patient to be discharged home.  ____________________________________________   FINAL CLINICAL IMPRESSION(S) / ED DIAGNOSES  Final diagnoses:  Palpitations  Chest pain, unspecified type     Note: This dictation was prepared with Dragon dictation. Any transcriptional errors that result from this process are unintentional     Phineas SemenGoodman, Zenia Guest, MD 01/08/19 2109    Phineas SemenGoodman, Reubin Bushnell, MD 01/08/19 2111

## 2019-01-08 NOTE — ED Triage Notes (Addendum)
Pt to ED via POV c/o "rapid heart rate". Pt states that he was outside working today and started to feel bad. Pt reports that he felt like his heart rate was high, pt state that he could feel his heart beating in his neck. Pt states that he took a metoprolol and flecainide for his heart rate. Pt states that he is having some discomfort in his chest. Pt had cough and congestion earlier this week. Pt is in NAD.

## 2019-01-08 NOTE — Discharge Instructions (Addendum)
Please seek medical attention for any high fevers, chest pain, shortness of breath, change in behavior, persistent vomiting, bloody stool or any other new or concerning symptoms.  

## 2019-01-12 NOTE — Unmapped (Signed)
[facility] Clinical Assessment & Refill Coordination Note    Timothy Castro, DOB: 14-Jul-1970  Phone: [phone] (home)     All above HIPAA information was verified with patient.     Specialty Medication(s):   Infectious Disease: Biktarvy     Current Outpatient Medications   Medication Sig Dispense Refill   ??? bictegrav-emtricit-tenofov ala (BIKTARVY) 50-200-25 mg tablet Take 1 tablet by mouth daily. 30 tablet 11   ??? flecainide (TAMBOCOR) 100 MG tablet Take 2 tablets by mouth daily.     ??? metoprolol succinate (TOPROL-XL) 50 MG 24 hr tablet Take 25 mg by mouth two (2) times a day.       No current facility-administered medications for this visit.         Changes to medications: Timothy Castro reports no changes at this time.    No Known Allergies    Changes to allergies: No    SPECIALTY MEDICATION ADHERENCE     Biktarvy 50-200-25 mg: 5 days of medicine on hand             Specialty medication(s) dose(s) confirmed: Regimen is correct and unchanged.     Are there any concerns with adherence? No    Adherence counseling provided? Yes: Pt says he missed doses due to mix up with trial card.  He had tabs from [facility] he was using    CLINICAL MANAGEMENT AND INTERVENTION      Clinical Benefit Assessment:    Do you feel the medicine is effective or helping your condition? No    Clinical Benefit counseling provided? Not needed    Adverse Effects Assessment:    Are you experiencing any side effects? No    Are you experiencing difficulty administering your medicine? No    Quality of Life Assessment:    How many days over the past month did your HIV  keep you from your normal activities? For example, brushing your teeth or getting up in the morning. 0    Have you discussed this with your provider? Not needed    Therapy Appropriateness:    Is therapy appropriate? Yes, therapy is appropriate and should be continued    DISEASE/MEDICATION-SPECIFIC INFORMATION      N/A    PATIENT SPECIFIC NEEDS     ? Does the patient have any physical, cognitive, or cultural barriers? No    ? Is the patient high risk? No     ? Does the patient require a Care Management Plan? No     ? Does the patient require physician intervention or other additional services (i.e. nutrition, smoking cessation, social work)? No      SHIPPING     Specialty Medication(s) to be Shipped:   Infectious Disease: Biktarvy    Other medication(s) to be shipped: n/a     Changes to insurance: No    Delivery Scheduled: Yes, Expected medication delivery date: 01/14/19.     Medication will be delivered via Next Day Courier to the confirmed home address in [facility].    The patient will receive a drug information handout for each medication shipped and additional FDA Medication Guides as required.  Verified that patient has previously received a Conservation officer, historic buildings.    All of the patient's questions and concerns have been addressed.    Timothy Castro   [facility] Specialty Pharmacist

## 2019-01-13 MED FILL — BIKTARVY 50 MG-200 MG-25 MG TABLET: ORAL | 30 days supply | Qty: 30 | Fill #1

## 2019-01-13 MED FILL — BIKTARVY 50 MG-200 MG-25 MG TABLET: 30 days supply | Qty: 30 | Fill #1 | Status: AC

## 2019-02-08 NOTE — Unmapped (Signed)
[facility] Refill Coordination Note    Specialty Medication(s) to be Shipped:   Infectious Disease: Biktarvy    Other medication(s) to be shipped: none     Timothy Castro, DOB: [birth year]  Phone: [phone] (home)       All above HIPAA information was verified with patient.     Completed refill call assessment today to schedule patient's medication shipment from the [facility] [phone]).       Specialty medication(s) and dose(s) confirmed: Regimen is correct and unchanged.   Changes to medications: Timothy Castro reports no changes at this time.  Changes to insurance: No  Questions for the pharmacist: No    Confirmed patient received Welcome Packet with first shipment. The patient will receive a drug information handout for each medication shipped and additional FDA Medication Guides as required.       DISEASE/MEDICATION-SPECIFIC INFORMATION        N/A    SPECIALTY MEDICATION ADHERENCE     Medication Adherence    Patient reported X missed doses in the last month: 0  Specialty Medication: BIKTARVY  Patient is on additional specialty medications: No                Biktarvy 50-200-25 mg: 14 days of medicine on hand          SHIPPING     Shipping address confirmed in Epic.     Delivery Scheduled: Yes, Expected medication delivery date: 02/22/19.     Medication will be delivered via Same Day Courier to the home address in Epic [place].    Unk Lightning   [facility] Specialty Technician

## 2019-02-22 MED FILL — BIKTARVY 50 MG-200 MG-25 MG TABLET: ORAL | 30 days supply | Qty: 30 | Fill #2

## 2019-02-22 MED FILL — BIKTARVY 50 MG-200 MG-25 MG TABLET: 30 days supply | Qty: 30 | Fill #2 | Status: AC

## 2019-03-22 NOTE — Unmapped (Signed)
[facility] Refill Coordination Note    Specialty Medication(s) to be Shipped:   Infectious Disease: Biktarvy    Other medication(s) to be shipped: n/a     Timothy Castro, DOB: 02/01/1971  Phone: [phone] (home)       All above HIPAA information was verified with patient.     Completed refill call assessment today to schedule patient's medication shipment from the [facility] [phone]).       Specialty medication(s) and dose(s) confirmed: Regimen is correct and unchanged.   Changes to medications: Timothy Castro reports no changes at this time.  Changes to insurance: No  Questions for the pharmacist: No    Confirmed patient received Welcome Packet with first shipment. The patient will receive a drug information handout for each medication shipped and additional FDA Medication Guides as required.       DISEASE/MEDICATION-SPECIFIC INFORMATION        N/A    SPECIALTY MEDICATION ADHERENCE     Medication Adherence    Patient reported X missed doses in the last month: 0  Specialty Medication: biktarvy                biktarvy  : 6 days of medicine on hand       SHIPPING     Shipping address confirmed in Epic.     Delivery Scheduled: Yes, Expected medication delivery date: 9/18.     Medication will be delivered via Next Day Courier to the home address in Epic [place].    Timothy Castro   [facility] Specialty Technician

## 2019-03-24 MED FILL — BIKTARVY 50 MG-200 MG-25 MG TABLET: ORAL | 30 days supply | Qty: 30 | Fill #3

## 2019-03-24 MED FILL — BIKTARVY 50 MG-200 MG-25 MG TABLET: 30 days supply | Qty: 30 | Fill #3 | Status: AC

## 2019-04-22 NOTE — Unmapped (Signed)
[facility] Refill Coordination Note    Specialty Medication(s) to be Shipped:   Infectious Disease: Biktarvy    Other medication(s) to be shipped: N/A     Timothy Castro, DOB: 13-Mar-1971  Phone: [phone] (home)       All above HIPAA information was verified with patient.     Completed refill call assessment today to schedule patient's medication shipment from the [facility] [phone]).       Specialty medication(s) and dose(s) confirmed: Regimen is correct and unchanged.   Changes to medications: Timothy Castro reports no changes at this time.  Changes to insurance: No  Questions for the pharmacist: No    Confirmed patient received Welcome Packet with first shipment. The patient will receive a drug information handout for each medication shipped and additional FDA Medication Guides as required.       DISEASE/MEDICATION-SPECIFIC INFORMATION        N/A    SPECIALTY MEDICATION ADHERENCE     Medication Adherence    Patient reported X missed doses in the last month: 0  Specialty Medication: biktarvy  Patient is on additional specialty medications: No          BIKTARVY 50-200-25 mg tablet : 6 days of medicine on hand     SHIPPING     Shipping address confirmed in Epic.     Delivery Scheduled: Yes, Expected medication delivery date: 04/26/2019.     Medication will be delivered via Next Day Courier to the home address in [employer] [place].    [name]   [facility] Specialty Technician

## 2019-04-25 MED FILL — BIKTARVY 50 MG-200 MG-25 MG TABLET: 30 days supply | Qty: 30 | Fill #4 | Status: AC

## 2019-04-25 MED FILL — BIKTARVY 50 MG-200 MG-25 MG TABLET: ORAL | 30 days supply | Qty: 30 | Fill #4

## 2019-05-08 ENCOUNTER — Emergency Department: Payer: BLUE CROSS/BLUE SHIELD

## 2019-05-08 ENCOUNTER — Encounter: Payer: Self-pay | Admitting: Emergency Medicine

## 2019-05-08 ENCOUNTER — Emergency Department
Admission: EM | Admit: 2019-05-08 | Discharge: 2019-05-08 | Disposition: A | Discharge status: Home / Self Care | Payer: BLUE CROSS/BLUE SHIELD | Attending: Emergency Medicine | Admitting: Emergency Medicine

## 2019-05-08 ENCOUNTER — Other Ambulatory Visit: Payer: Self-pay

## 2019-05-08 DIAGNOSIS — E876 Hypokalemia: Secondary | ICD-10-CM | POA: Diagnosis not present

## 2019-05-08 DIAGNOSIS — R61 Generalized hyperhidrosis: Secondary | ICD-10-CM | POA: Insufficient documentation

## 2019-05-08 DIAGNOSIS — R0602 Shortness of breath: Secondary | ICD-10-CM | POA: Insufficient documentation

## 2019-05-08 DIAGNOSIS — F17210 Nicotine dependence, cigarettes, uncomplicated: Secondary | ICD-10-CM | POA: Insufficient documentation

## 2019-05-08 DIAGNOSIS — R002 Palpitations: Secondary | ICD-10-CM | POA: Diagnosis present

## 2019-05-08 DIAGNOSIS — B20 Human immunodeficiency virus [HIV] disease: Secondary | ICD-10-CM | POA: Insufficient documentation

## 2019-05-08 HISTORY — DX: Palpitations: R00.2

## 2019-05-08 LAB — CBC
HCT: 43.7 % (ref 39.0–52.0)
Hemoglobin: 14.4 g/dL (ref 13.0–17.0)
MCH: 31.3 pg (ref 26.0–34.0)
MCHC: 33 g/dL (ref 30.0–36.0)
MCV: 95 fL (ref 80.0–100.0)
Platelets: 213 10*3/uL (ref 150–400)
RBC: 4.6 MIL/uL (ref 4.22–5.81)
RDW: 11.9 % (ref 11.5–15.5)
WBC: 11.5 10*3/uL — ABNORMAL HIGH (ref 4.0–10.5)
nRBC: 0 % (ref 0.0–0.2)

## 2019-05-08 LAB — BASIC METABOLIC PANEL
Anion gap: 10 (ref 5–15)
BUN: 12 mg/dL (ref 6–20)
CO2: 25 mmol/L (ref 22–32)
Calcium: 9.1 mg/dL (ref 8.9–10.3)
Chloride: 104 mmol/L (ref 98–111)
Creatinine, Ser: 1.34 mg/dL — ABNORMAL HIGH (ref 0.61–1.24)
GFR calc Af Amer: >60 mL/min (ref >60)
GFR calc non Af Amer: >60 mL/min (ref >60)
Glucose, Bld: 109 mg/dL — ABNORMAL HIGH (ref 70–99)
Potassium: 3.2 mmol/L — ABNORMAL LOW (ref 3.5–5.1)
Sodium: 139 mmol/L (ref 135–145)

## 2019-05-08 LAB — TROPONIN I (HIGH SENSITIVITY)
Troponin I (High Sensitivity): 3 ng/L (ref <18)
Troponin I (High Sensitivity): 3 ng/L (ref <18)

## 2019-05-08 MED ORDER — POTASSIUM CHLORIDE CRYS ER 20 MEQ PO TBCR
40.0000 meq | EXTENDED_RELEASE_TABLET | Freq: Once | ORAL | Status: AC
  Administered 2019-05-08: 40 meq via ORAL
  Filled 2019-05-08: qty 2

## 2019-05-08 MED ORDER — IOHEXOL 350 MG/ML SOLN
75.0000 mL | Freq: Once | INTRAVENOUS | Status: AC | PRN
  Administered 2019-05-08: 75 mL via INTRAVENOUS

## 2019-05-08 MED ORDER — SODIUM CHLORIDE 0.9 % IV BOLUS
1000.0000 mL | Freq: Once | INTRAVENOUS | Status: AC
  Administered 2019-05-08: 1000 mL via INTRAVENOUS

## 2019-05-08 NOTE — ED Provider Notes (Signed)
[facility] Emergency Department Provider Note   ____________________________________________   First MD Initiated Contact with Patient 05/08/19 [phone]     (approximate)  I have reviewed the triage vital signs and the nursing notes.   HISTORY  Chief Complaint Tachycardia    HPI Jose George is a 48 y.o. male who presents to the ED from home with a chief complaint of palpitations.  Patient reports he was at rest when he felt like his heart was racing and pounding.  No irregular heartbeat.  Got into the shower and felt like heart rate increase more.  After his shower he was sweating with some shortness of breath which concerned him and brought him to the ED.  Similar symptoms previously with several cardiac work-ups.  On metoprolol and flecainide.  Denies associated fever, cough chest pain, dizziness, abdominal pain, nausea, vomiting or diarrhea.  Denies recent travel, trauma or exposure to persons diagnosed with coronavirus.       Past Medical History:  Diagnosis Date   HIV (human immunodeficiency virus infection) ([place])    Palpitations     Patient Active Problem List   Diagnosis Date Noted   HIV (human immunodeficiency virus infection) ([place]) 07/14/2018   Nonsustained ventricular tachycardia ([place]) 05/11/2018    History reviewed. No pertinent surgical history.  Home medications Metoprolol Flecainide Genvoya Corgard  Allergies Patient has no known allergies.  History reviewed. No pertinent family history.  Social History Social History   Tobacco Use   Smoking status: Current Every Day Smoker    Packs/day: 0.10    Types: Cigarettes   Smokeless tobacco: Never Used  Substance Use Topics   Alcohol use: No   Drug use: Never    Review of Systems  Constitutional: No fever/chills Eyes: No visual changes. ENT: No sore throat. Cardiovascular: Positive for palpitations.  Denies chest pain. Respiratory: Denies shortness of  breath. Gastrointestinal: No abdominal pain.  No nausea, no vomiting.  No diarrhea.  No constipation. Genitourinary: Negative for dysuria. Musculoskeletal: Negative for back pain. Skin: Negative for rash. Neurological: Negative for headaches, focal weakness or numbness.   ____________________________________________   PHYSICAL EXAM:  VITAL SIGNS: ED Triage Vitals  Enc Vitals Group     BP 05/08/19 0109 (!) 153/103     Pulse Rate 05/08/19 0109 97     Resp 05/08/19 0109 16     Temp 05/08/19 0109 99 F (37.2 C)     Temp Source 05/08/19 0107 Oral     SpO2 05/08/19 0109 97 %     Weight 05/08/19 0108 194 lb (88 kg)     Height 05/08/19 0108 5\' 10"  (1.778 m)     Head Circumference --      Peak Flow --      Pain Score 05/08/19 0108 0     Pain Loc --      Pain Edu? --      Excl. in [place]? --     Constitutional: Alert and oriented. Well appearing and in no acute distress. Eyes: Conjunctivae are normal. PERRL. EOMI. Head: Atraumatic. Nose: No congestion/rhinnorhea. Mouth/Throat: Mucous membranes are moist.  Oropharynx non-erythematous. Neck: No stridor.   Cardiovascular: Normal rate, regular rhythm. Grossly normal heart sounds.  Good peripheral circulation. Respiratory: Normal respiratory effort.  No retractions. Lungs CTAB. Gastrointestinal: Soft and nontender. No distention. No abdominal bruits. No CVA tenderness. Musculoskeletal: No lower extremity tenderness nor edema.  No joint effusions. Neurologic:  Normal speech and language. No gross focal neurologic deficits are  appreciated. No gait instability. Skin:  Skin is warm, dry and intact. No rash noted. Psychiatric: Mood and affect are normal. Speech and behavior are normal.  ____________________________________________   LABS (all labs ordered are listed, but only abnormal results are displayed)  Labs Reviewed  BASIC METABOLIC PANEL - Abnormal; Notable for the following components:      Result Value   Potassium 3.2 (*)     Glucose, Bld 109 (*)    Creatinine, Ser 1.34 (*)    All other components within normal limits  CBC - Abnormal; Notable for the following components:   WBC 11.5 (*)    All other components within normal limits  TROPONIN I (HIGH SENSITIVITY)  TROPONIN I (HIGH SENSITIVITY)   ____________________________________________  EKG  ED ECG REPORT I, Summit Borchardt J, the attending physician, personally viewed and interpreted this ECG.   Date: 05/08/2019  EKG Time: 0006  Rate: 96  Rhythm: normal EKG, normal sinus rhythm  Axis: Normal  Intervals:none  ST&T Change: Nonspecific  ____________________________________________  RADIOLOGY  ED MD interpretation: No acute cardiopulmonary process; no PE  Official radiology report(s): Dg Chest 2 View  Result Date: 05/08/2019 CLINICAL DATA:  Tachycardia EXAM: CHEST - 2 VIEW COMPARISON:  January 26, 2018. FINDINGS: The heart size and mediastinal contours are within normal limits. Both lungs are clear. The visualized skeletal structures are unremarkable. IMPRESSION: No active cardiopulmonary disease. Electronically Signed   By: Katherine Mantle M.D.   On: 05/08/2019 01:46   Ct Angio Chest Pe W/cm &/or Wo Cm  Result Date: 05/08/2019 CLINICAL DATA:  Tachycardia. Diaphoresis. Smoker. EXAM: CT ANGIOGRAPHY CHEST WITH CONTRAST TECHNIQUE: Multidetector CT imaging of the chest was performed using the standard protocol during bolus administration of intravenous contrast. Multiplanar CT image reconstructions and MIPs were obtained to evaluate the vascular anatomy. CONTRAST:  72mL OMNIPAQUE IOHEXOL 350 MG/ML SOLN COMPARISON:  Chest radiographs obtained earlier today. FINDINGS: Cardiovascular: Satisfactory opacification of the pulmonary arteries to the segmental level. No evidence of pulmonary embolism. Normal heart size. No pericardial effusion. Mediastinum/Nodes: No enlarged mediastinal, hilar, or axillary lymph nodes. Thyroid gland, trachea, and esophagus demonstrate no  significant findings. Lungs/Pleura: Minimal bilateral dependent atelectasis and subpleural bullous changes. No pleural fluid. Upper Abdomen: Unremarkable. Musculoskeletal: Mild thoracic spine degenerative changes. Minimal bilateral gynecomastia. Review of the MIP images confirms the above findings. IMPRESSION: 1. No pulmonary emboli or acute abnormality. 2. Minimal paraseptal bullous emphysema. Emphysema (ICD10-J43.9). Electronically Signed   By: Beckie Salts M.D.   On: 05/08/2019 06:20    ____________________________________________   PROCEDURES  Procedure(s) performed (including Critical Care):  Procedures   ____________________________________________   INITIAL IMPRESSION / ASSESSMENT AND PLAN / ED COURSE  As part of my medical decision making, I reviewed the following data within the electronic MEDICAL RECORD NUMBER Nursing notes reviewed and incorporated, Labs reviewed, EKG interpreted, Old chart reviewed, Radiograph reviewed and Notes from prior ED visits     Jose George was evaluated in Emergency Department on 05/08/2019 for the symptoms described in the history of present illness. He was evaluated in the context of the global COVID-19 pandemic, which necessitated consideration that the patient might be at risk for infection with the SARS-CoV-2 virus that causes COVID-19. Institutional protocols and algorithms that pertain to the evaluation of patients at risk for COVID-19 are in a state of rapid change based on information released by regulatory bodies including the CDC and federal and state organizations. These policies and algorithms were followed during the patient's care in the  ED.    48 year old male who presents with palpitations.  Does endorse that he drinks regular coffee after dinner regularly. Differential diagnosis includes, but is not limited to, ACS, aortic dissection, pulmonary embolism, cardiac tamponade, pneumothorax, pneumonia, pericarditis, myocarditis, GI-related  causes including esophagitis/gastritis, and musculoskeletal chest wall pain.    Will repeat times troponin, replete potassium, obtain CT chest to evaluate for PE.  Will reassess.   Clinical Course as of May 07 708  Wynelle LinkSun May 08, 2019  0708 Patient was updated of repeat troponin and CT scan results.  Referred to cardiology for evaluation and possible Holter monitoring which patient states he has not had previously.  Advised patient to decrease caffeine intake, especially in the evening.  Strict return precautions were given.  Patient verbalized understanding and agreed with plan of care.   [JS]    Clinical Course User Index [JS] Irean HongSung, Yonatan Guitron J, MD     ____________________________________________   FINAL CLINICAL IMPRESSION(S) / ED DIAGNOSES  Final diagnoses:  Palpitations  Hypokalemia     ED Discharge Orders    None       Note:  This document was prepared using Dragon voice recognition software and may include unintentional dictation errors.   Irean HongSung, Tifanie Gardiner J, MD 05/08/19 [phone]

## 2019-05-08 NOTE — Discharge Instructions (Addendum)
Continue daily medicines as directed by your doctor. Decrease caffeine intake whenever possible. Return to the ER for worsening symptoms, persistent vomiting, difficulty breathing or other concerns.

## 2019-05-08 NOTE — ED Triage Notes (Signed)
Pt says just before he got into the shower about 1 hour ago he noticed his heart racing; says it was "thumping"; denies chest pain; pt felt like the HR increased even more in the shower; what concerned him was the sweating that started after the shower; some shortness of breath; denies N/V; pt awake and alert; talking in complete coherent sentences

## 2019-05-23 NOTE — Unmapped (Signed)
[facility] Refill Coordination Note    Specialty Medication(s) to be Shipped:   Infectious Disease: Biktarvy    Other medication(s) to be shipped: n/a     Timothy Castro, DOB: 1970-07-09  Phone: [phone] (home)       All above HIPAA information was verified with patient.     Completed refill call assessment today to schedule patient's medication shipment from the [facility] [phone]).       Specialty medication(s) and dose(s) confirmed: Regimen is correct and unchanged.   Changes to medications: Timothy Castro reports no changes at this time.  Changes to insurance: No  Questions for the pharmacist: No    Confirmed patient received Welcome Packet with first shipment. The patient will receive a drug information handout for each medication shipped and additional FDA Medication Guides as required.       DISEASE/MEDICATION-SPECIFIC INFORMATION        N/A    SPECIALTY MEDICATION ADHERENCE     Medication Adherence    Patient reported X missed doses in the last month: 0  Specialty Medication: Biktarvy 50-200-25mg   Patient is on additional specialty medications: No                biktarvy  : 5 days of medicine on hand         SHIPPING     Shipping address confirmed in Epic.     Delivery Scheduled: Yes, Expected medication delivery date: 11/18.     Medication will be delivered via Next Day Courier to the prescription address in Epic WAM.    Timothy Castro   [facility] Specialty Technician

## 2019-05-24 MED FILL — BIKTARVY 50 MG-200 MG-25 MG TABLET: 30 days supply | Qty: 30 | Fill #5 | Status: AC

## 2019-05-24 MED FILL — BIKTARVY 50 MG-200 MG-25 MG TABLET: ORAL | 30 days supply | Qty: 30 | Fill #5

## 2019-06-22 MED FILL — BIKTARVY 50 MG-200 MG-25 MG TABLET: 30 days supply | Qty: 30 | Fill #6 | Status: AC

## 2019-06-22 MED FILL — BIKTARVY 50 MG-200 MG-25 MG TABLET: ORAL | 30 days supply | Qty: 30 | Fill #6

## 2019-06-22 NOTE — Unmapped (Signed)
[facility] Clinical Assessment & Refill Coordination Note     Timothy Castro, DOB: 1971-03-11  Phone: [phone] (home)     All above HIPAA information was verified with patient.     Was a Nurse, learning disability used for this call? No    Specialty Medication(s):   Infectious Disease: Biktarvy     Current Outpatient Medications   Medication Sig Dispense Refill   ??? flecainide (TAMBOCOR) 100 MG tablet Take 2 tablets by mouth daily. Patient now takes only 50mg  daily     ??? metoprolol succinate (TOPROL-XL) 25 MG 24 hr tablet Take 25 mg by mouth two (2) times a day.      ??? bictegrav-emtricit-tenofov ala (BIKTARVY) 50-200-25 mg tablet Take 1 tablet by mouth daily. 30 tablet 11     No current facility-administered medications for this visit.         Changes to medications: Yes. Patient reports that his flecainide dose is now 50mg  daily and he is now taking a whole Toprol XL 25mg  BID instead of breaking a 50mg  tablet.    No Known Allergies    Changes to allergies: No    SPECIALTY MEDICATION ADHERENCE     Biktarvy   : 5 days of medicine on hand       Medication Adherence    Patient reported X missed doses in the last month: 0  Specialty Medication: Biktarvy  Patient is on additional specialty medications: No  Demonstrates understanding of importance of adherence: yes  Informant: patient  Provider-estimated medication adherence level: good  Patient is at risk for Non-Adherence: No          Specialty medication(s) dose(s) confirmed: Regimen is correct and unchanged.     Are there any concerns with adherence? No    Adherence counseling provided? Not needed    CLINICAL MANAGEMENT AND INTERVENTION      Clinical Benefit Assessment:    Do you feel the medicine is effective or helping your condition? Yes    HIV ASSOCIATED LABS:     Lab Results   Component Value Date/Time    HIVRS Not Detected 06/23/2018 03:57 PM    HIVRS Detected (A) 12/23/2017 04:49 PM    HIVRS Not Detected 05/20/2017 01:55 PM HIVRS Not Detected 06/21/2014 04:29 PM    HIVRS Not Detected 01/11/2014 04:30 PM    HIVRS Not Detected 09/14/2013 04:11 PM    HIVCP <40 (H) 12/23/2017 04:49 PM    HIVCP 49536 06/08/2013 04:34 PM    ACD4 760 12/05/2015 02:14 PM    ACD4 672 06/21/2014 04:29 PM    ACD4 581 09/14/2013 04:11 PM    ACD4 350 (L) 06/08/2013 04:34 PM       Clinical Benefit counseling provided? Labs from 06/23/2018 show evidence of clinical benefit     Patient needs an appointment and labs     Adverse Effects Assessment:    Are you experiencing any side effects? No     Are you experiencing difficulty administering your medicine? No    Quality of Life Assessment:    How many days over the past month did your HIV  keep you from your normal activities? For example, brushing your teeth or getting up in the morning. 0    Have you discussed this with your provider? Not needed    Therapy Appropriateness:    Is therapy appropriate? Yes, therapy is appropriate and should be continued    DISEASE/MEDICATION-SPECIFIC INFORMATION      N/A  PATIENT SPECIFIC NEEDS     ? Does the patient have any physical, cognitive, or cultural barriers? No    ? Is the patient high risk? No     ? Does the patient require a Care Management Plan? No     ? Does the patient require physician intervention or other additional services (i.e. nutrition, smoking cessation, social work)? No      SHIPPING     Specialty Medication(s) to be Shipped:   Infectious Disease: Biktarvy    Other medication(s) to be shipped: n/a     Changes to insurance: No    Delivery Scheduled: Yes, Expected medication delivery date: 06/23/2019.     Medication will be delivered via Next Day Courier to the confirmed prescription address in [facility].    The patient will receive a drug information handout for each medication shipped and additional FDA Medication Guides as required.  Verified that patient has previously received a Conservation officer, historic buildings. All of the patient's questions and concerns have been addressed.    Roderic Palau   [facility] Specialty Pharmacist

## 2019-07-22 NOTE — Unmapped (Signed)
[facility] Refill Coordination Note    Specialty Medication(s) to be Shipped:   Infectious Disease: Biktarvy    Other medication(s) to be shipped: n/a     Timothy Castro, DOB: [birth year]  Phone: [phone] (home)       All above HIPAA information was verified with patient.     Was a Nurse, learning disability used for this call? No    Completed refill call assessment today to schedule patient's medication shipment from the [facility] [phone]).       Specialty medication(s) and dose(s) confirmed: Regimen is correct and unchanged.   Changes to medications: [name] reports no changes at this time.  Changes to insurance: No  Questions for the pharmacist: No    Confirmed patient received Welcome Packet with first shipment. The patient will receive a drug information handout for each medication shipped and additional FDA Medication Guides as required.       DISEASE/MEDICATION-SPECIFIC INFORMATION        N/A    SPECIALTY MEDICATION ADHERENCE     Medication Adherence    Patient reported X missed doses in the last month: 0  Specialty Medication: biktarvy  Patient is on additional specialty medications: No  Patient is on more than two specialty medications: No  Any gaps in refill history greater than 2 weeks in the last 3 months: no  Demonstrates understanding of importance of adherence: yes  Informant: patient                Biktarvy 50-200-25mg : Patient has 5 days of medication on hand      SHIPPING     Shipping address confirmed in Epic.     Delivery Scheduled: Yes, Expected medication delivery date: 07/27/19.     Medication will be delivered via UPS to the prescription address in Epic WAM.    [name]   [facility] Specialty Technician

## 2019-07-26 MED FILL — BIKTARVY 50 MG-200 MG-25 MG TABLET: ORAL | 30 days supply | Qty: 30 | Fill #7

## 2019-07-26 MED FILL — BIKTARVY 50 MG-200 MG-25 MG TABLET: 30 days supply | Qty: 30 | Fill #7 | Status: AC

## 2019-07-30 IMAGING — CR DG CHEST 2V
2 series · 2 of 2 positions shown · non-contrast
Comparison: 09/24/2017

CLINICAL DATA: Chest pain

EXAM:
CHEST - 2 VIEW

[chest pa]
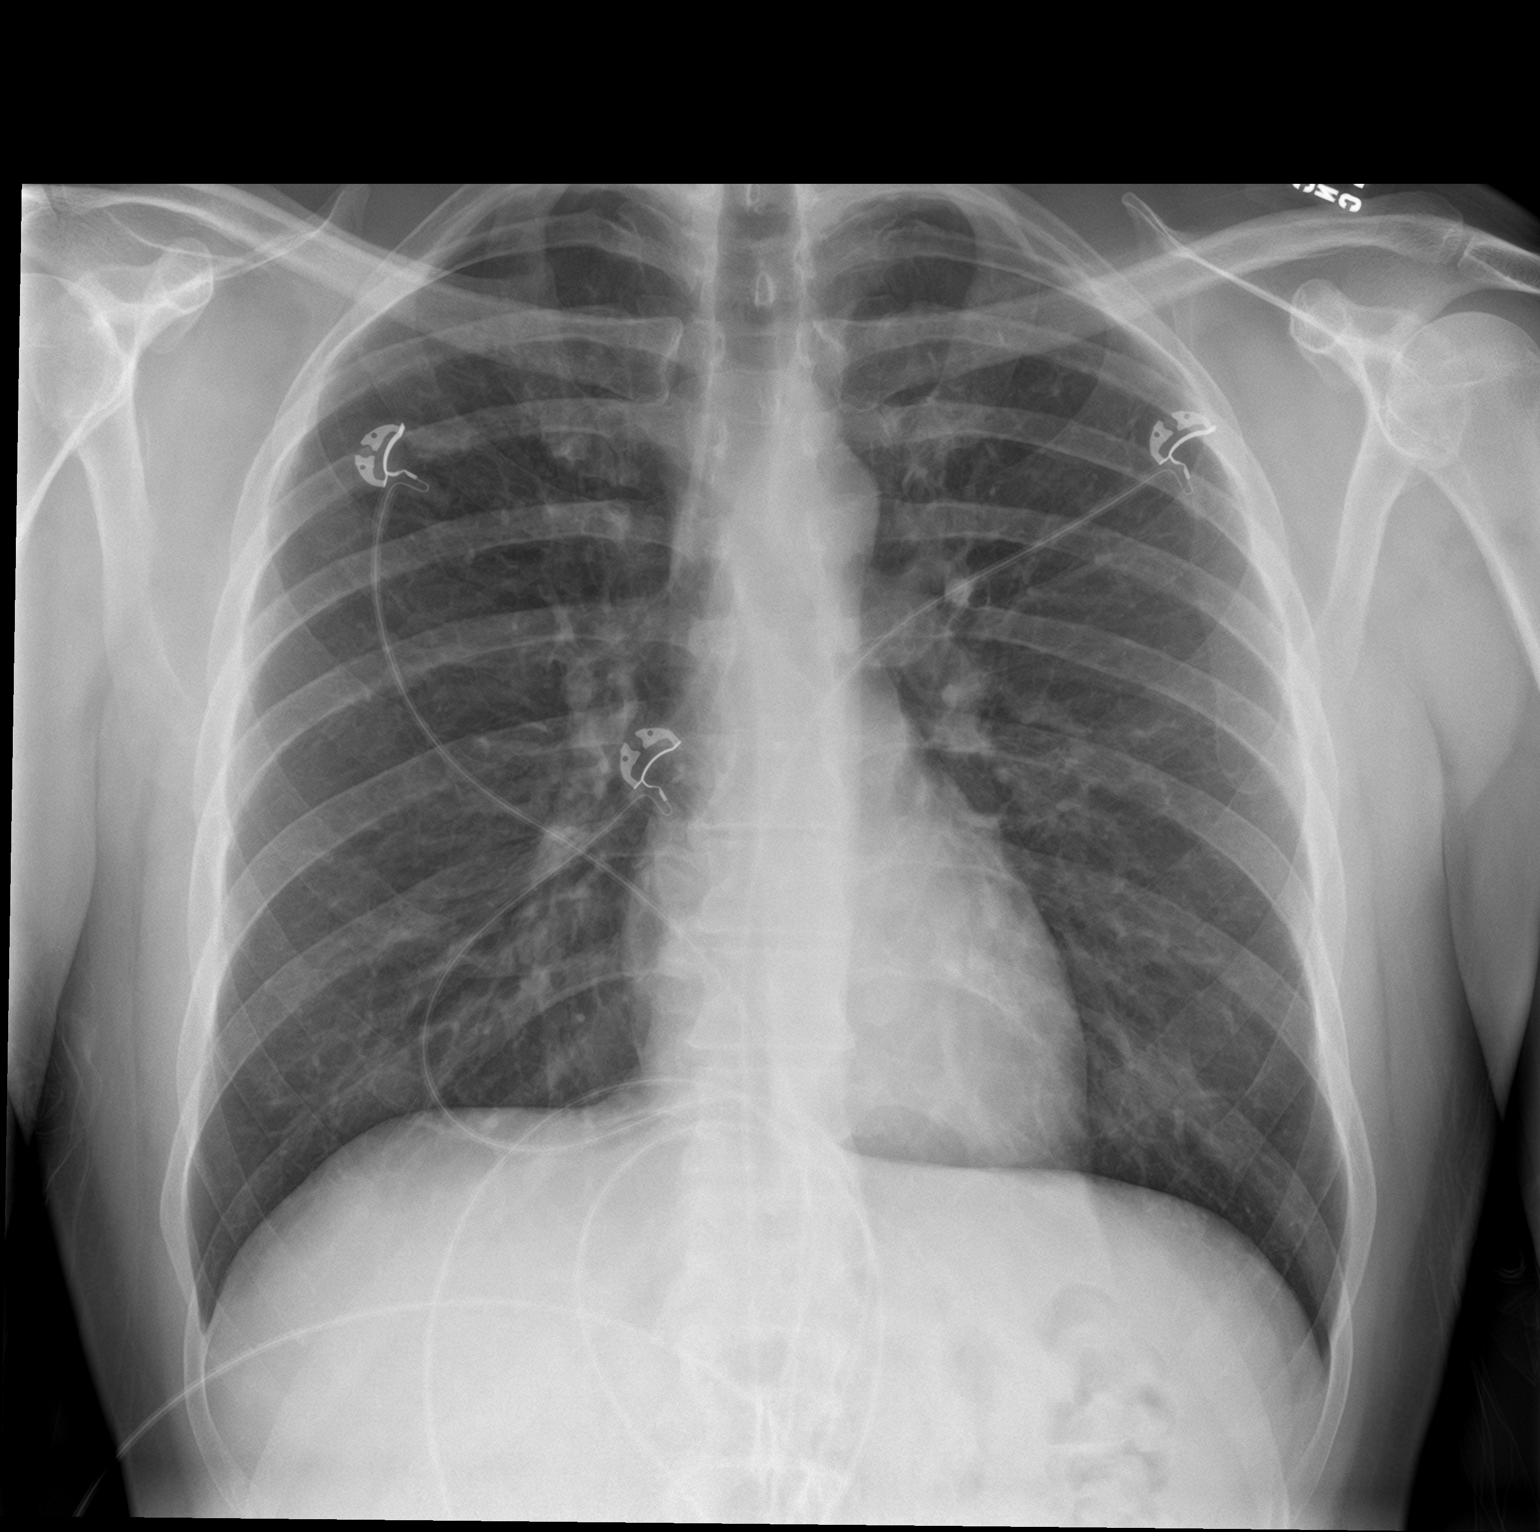

[chest lat]
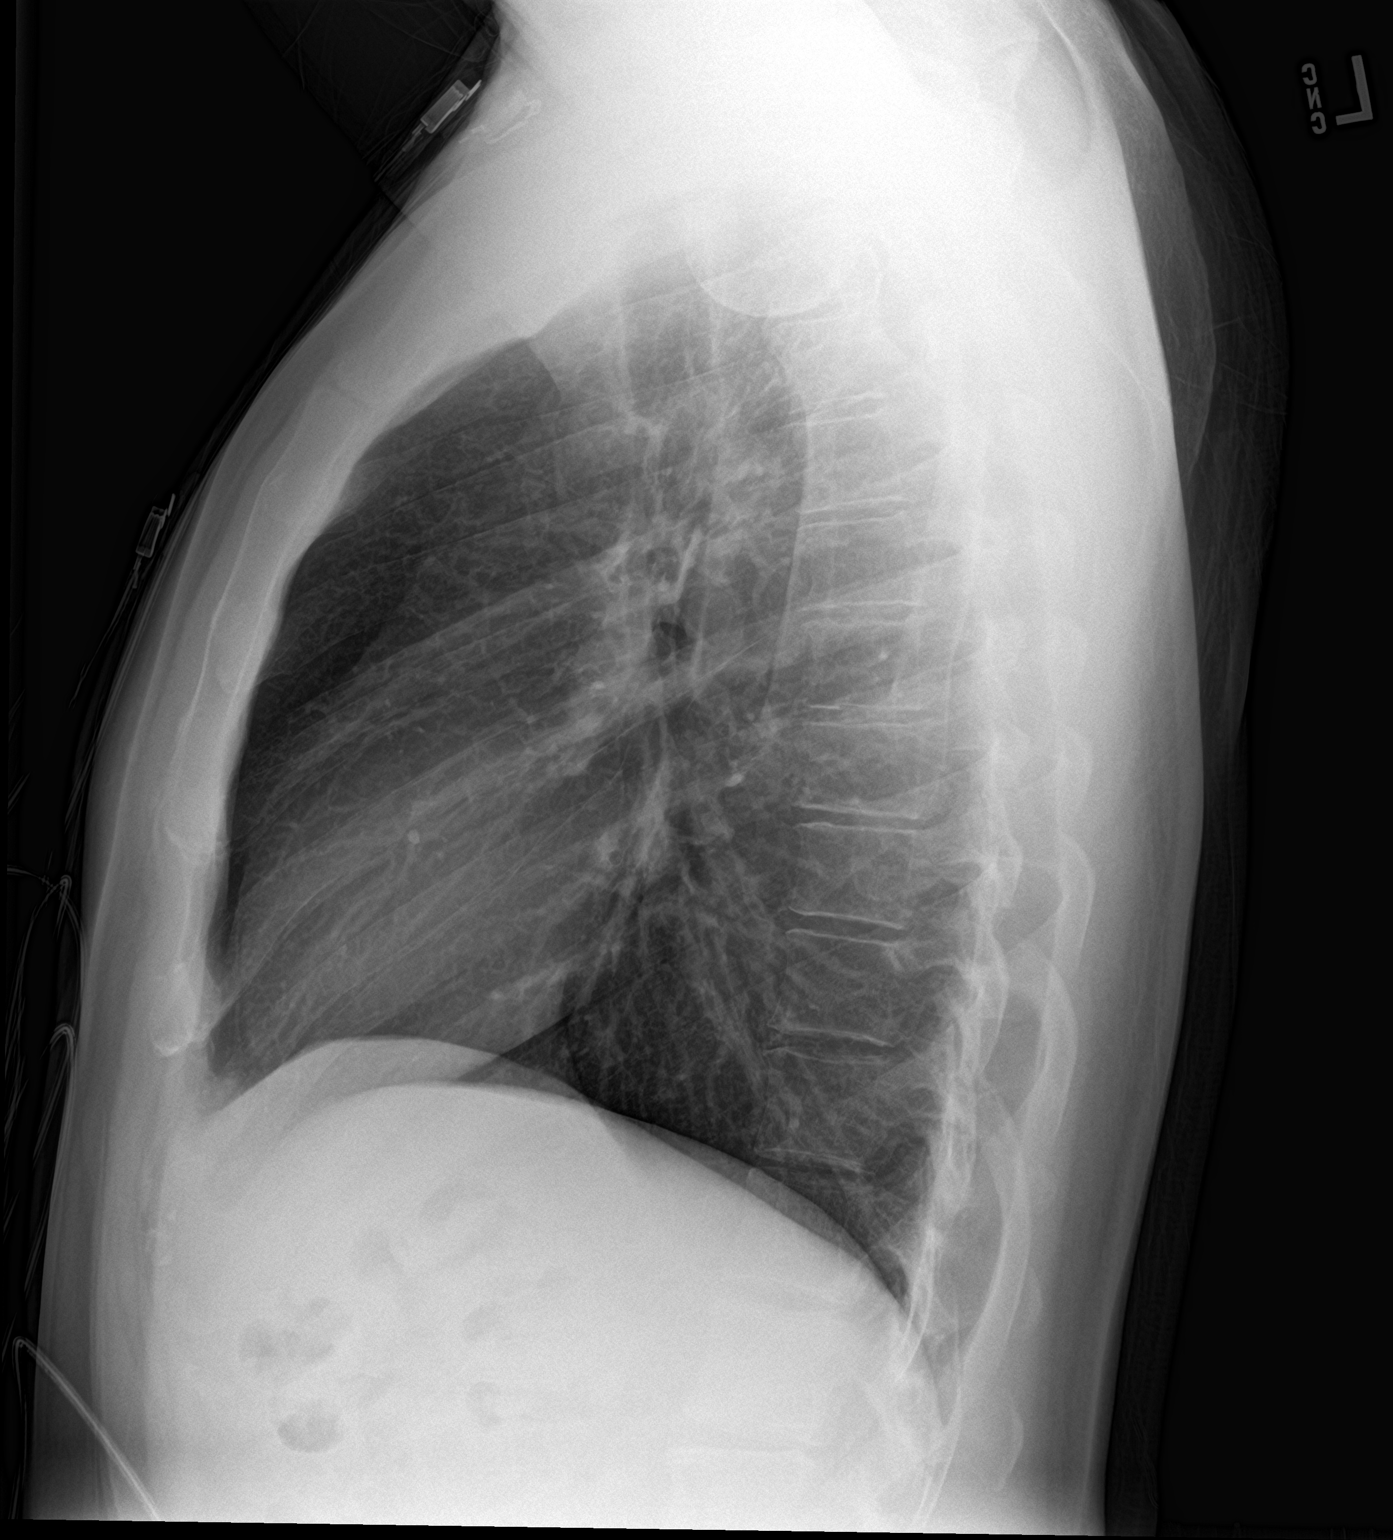

[2 of 2 positions shown; findings below may reference images not displayed]

FINDINGS: Heart and mediastinal contours are within normal limits. No focal
opacities or effusions. No acute bony abnormality.
IMPRESSION: No active cardiopulmonary disease.

## 2019-08-05 ENCOUNTER — Other Ambulatory Visit: Payer: Self-pay

## 2019-08-05 ENCOUNTER — Ambulatory Visit: Payer: BLUE CROSS/BLUE SHIELD | Admitting: Cardiology

## 2019-08-08 ENCOUNTER — Encounter: Admit: 2019-08-08 | Discharge: 2019-08-09 | Payer: PRIVATE HEALTH INSURANCE

## 2019-08-08 DIAGNOSIS — E785 Hyperlipidemia, unspecified: Principal | ICD-10-CM

## 2019-08-08 DIAGNOSIS — F17200 Nicotine dependence, unspecified, uncomplicated: Principal | ICD-10-CM

## 2019-08-08 DIAGNOSIS — R002 Palpitations: Principal | ICD-10-CM

## 2019-08-08 MED ORDER — METOPROLOL SUCCINATE ER 25 MG TABLET,EXTENDED RELEASE 24 HR
ORAL_TABLET | Freq: Two times a day (BID) | ORAL | 2 refills | 90 days | Status: CP
Start: 2019-08-08 — End: 2019-11-06

## 2019-08-09 NOTE — Unmapped (Signed)
DIVISION OF CARDIOLOGY  [facility], [place]        Date of Service: 08/08/2019      PCP: Referring Provider:   No PCP Per Patient  No address on file  Phone: None  Fax: None External Ordering Provider  No address on file  Phone: N/A  Fax:      Assessment and Plan:     Problem List Items Addressed This Visit        Other    Tobacco use disorder - Primary      Other Visit Diagnoses     Palpitations        Relevant Orders    ECG 12 Lead (Completed)    Dyslipidemia            Patient was started flecainide for symptomatic palpitations.  He denies any episodes of syncope or significant shortness of breath during these events.  His evaluation at [facility] in 2019 including cardiac MRI and cardiac cath was unremarkable.  As such I think it is reasonable to trial patient off of flecainide.  He also says that his work situation has changed to a less stressful environment, and he thinks this may help moving forward.  Recommend:    1.  Metoprolol as needed for now, can take it daily if palpitations increase.  2.  The patient will let me know if he has increased episodes of palpitations.  If his symptoms do worsen we will plan for a Zio patch for further evaluation.  3.  Recommend improved diet and exercise for primary prevention.  4.  Smoking cessation      Return in about 6 months (around 02/05/2020).      Subjective:          History of Present Illness: Timothy Castro is a 49 y.o. male with a history of HIV, PVCs/NSVT.  He presents today to establish care.    Patient's chart is reviewed.  He was admitted to [facility] at the end of 2019 with PVCs/NSVT. He was on the electrophysiology service. He was started on flecainide in addition to the metoprolol he is already taking. He had a cardiac MRI, cardiac cath, and echo that were all unremarkable.  Since starting on flecainide he had done pretty well.  He ran out of his medications 1 week ago, he had one episode of palpitations a couple of nights ago but overall has done okay.  He is interested in discontinuing flecainide long-term, as he had some side effects mostly GI in nature from this.    He denies having any episodes of syncope, severe shortness of breath, or severe chest pain with any of his prior palpitation/NSVT episodes.    He continues to smoke about 1 pack/day.    [facility] DC summary 05/2018:  Patient first noticed heart racing the night of 10/31. Went to ED in [place] and was discharged the same day with no new medications. He again felt heart racing on 11/4 and this time was admitted to [facility] for 3 days. During his hospitalization, his HR was brought down and controlled on metoprolol and he was discharged on nadolol. He again felt palpitations night of 11/8 which worsened through 11/9 and brought him to the [facility] ED.  ??  In the ED, he was hooked to cardiac monitor and found to have frequent PVCs and intermittent NSVT. He was given 5mg  IV lopressor and 25mg  PO lopressor, however intermittent arrhythmia continued. Patient also verbalized chest discomfort at  that time especially on deep breathing. Started on lidocaine gtt and patient responded appropriately. Labs unremarkable with K 4.2, Mg 2.1, Cr 1.2 baseline (1-1.2). Admitted to floor.   ??  On the floor, he describes the palpitations he was having today as accompanied by the feeling his throat was closing in, along with lightheadedness, dizziness, and shortness of breath. He currently denies chest pain, headaches, loss of consciousness, nausea/vomiting, fever/chills. He quit smoking about a week ago and was smoking a pack a day for 20 years prior to that. Denies alcohol or illicit drug use. He has not had prior imaging.           Past medical history:  Patient Active Problem List   Diagnosis   ??? HIV (human immunodeficiency virus infection) (CMS-HCC)   ??? Anal dysplasia   ??? Tobacco use disorder       Medications:   Patient's Medications   New Prescriptions    No medications on file   Previous Medications BICTEGRAV-EMTRICIT-TENOFOV ALA (BIKTARVY) 50-200-25 MG TABLET    Take 1 tablet by mouth daily.   Modified Medications    Modified Medication Previous Medication    METOPROLOL SUCCINATE (TOPROL-XL) 25 MG 24 HR TABLET metoprolol succinate (TOPROL-XL) 25 MG 24 hr tablet       Take 1 tablet (25 mg total) by mouth two (2) times a day.    Take 25 mg by mouth two (2) times a day.    Discontinued Medications    FLECAINIDE (TAMBOCOR) 100 MG TABLET    Take 2 tablets by mouth daily. Patient now takes only 50mg  daily       Allergies:  No Known Allergies    Social History:  He  reports that he has been smoking cigarettes. He started smoking about 28 years ago. He has been smoking about 1.00 pack per day. He has quit using smokeless tobacco. He reports that he does not drink alcohol or use drugs.    Family History:  His family history includes Arthritis in his mother; Cancer in his father; Heart disease in his mother.    Review of Systems  No fevers/chills, no n/v. Otherwise 10 systems were reviewed and negative except as noted in HPI.      Objective:       Physical Exam  BP 116/78  - Pulse 72  - Temp 35.9 ??C (96.7 ??F)  - Resp 18  - Ht 180.3 cm (5' 11)  - Wt 96.1 kg (211 lb 12.8 oz)  - SpO2 98%  - BMI 29.54 kg/m??    Wt Readings from Last 3 Encounters:   08/08/19 96.1 kg (211 lb 12.8 oz)   06/23/18 83.9 kg (185 lb)   12/23/17 81.4 kg (179 lb 6.4 oz)       General appearance: NAD, conversant   Lungs: CTAB, with normal respiratory effort  CV: RRR, no M/G/R. JVP normal at < 6cm. No carotid bruits appreciated bilaterally. No lower extremity edema. Peripheral pulses 2+ bilaterally.  Gastrointestinal: Soft, non-tender, non-distended. no masses or HSM  Skin: Warm and well perfused; Normal coloration without rash.  Psych: Appropriate affect, alert and oriented to person, place and time. Displays appropriate insight.        Most recent labs   Lab Results   Component Value Date    Sodium 139 06/23/2018    Sodium 142 06/21/2014 Potassium 4.3 06/23/2018    Potassium 4.8 06/21/2014    Chloride 103 06/23/2018    Chloride 99 06/21/2014  CO2 26.0 06/23/2018    CO2 30 06/21/2014    BUN 9 06/23/2018    BUN 10 06/21/2014    Creatinine 1.17 06/23/2018    Creatinine 1.22 06/21/2014     Lab Results   Component Value Date    HGB 14.2 06/23/2018    HGB 14.9 06/21/2014    MCV 97.1 06/23/2018    MCV 95 06/21/2014    Platelet 221 06/23/2018    Platelet 204 06/21/2014     Lab Results   Component Value Date    Cholesterol 194 12/23/2017    Cholesterol, Total 184 06/21/2014    Triglycerides 258 (H) 12/23/2017    HDL 27 (L) 12/23/2017    HDL 40 06/21/2014    Non-HDL Cholesterol 167 12/23/2017    LDL Calculated 115 (H) 12/23/2017    LDL Direct 128 06/21/2014    TSH 1.300 12/05/2015         ECG(08/08/2019 -personally reviewed): Sinus bradycardia, normal ECG.    Outside records reviewed:    Echo(05/11/18 [facility]):  - Left ventricle: The cavity size was normal. Wall thickness was   ??normal. Systolic function was normal. The estimated ejection   ??fraction was in the range of 50% to 55%. Wall motion was normal;   ??there were no regional wall motion abnormalities.    Cardiac MRI(05/17/18 - [facility]):  Evaluate myocardial morphology, function, viability and assess a 49 y.o. male currently on hospital day 2 admitted for palpitations with PVC/NSVT to assess for myocarditis and structural heart disease.  1. The left ventricle is normal in cavity size and wall thickness. Global systolic function is mildly  reduced. The LV ejection fraction is 52%. There are no regional wall motion abnormalities.  2. The right ventricle is normal in cavity size, and wall thickness. Global systolic function is normal,  the RVEF is 51%. There is regional akinetic or dyskinetic area. No findings consistent with proposed  modified MRI task force criteria for [facility] Berna Spare et al., [facility] 2010).  3. Both atria are normal in size.  4. The aortic valve is trileaflet with no aortic valve stenosis or regurgitation. There is trivial  tricuspid valve regurgitation, no significant valvular disease seen.   5. Delayed enhancement imaging demonstrates no evidence of myocardial infarction, scar or infiltrative  disease.       Cardiac cath(05/2018):  Dominance: co-dominant  Left main: normal  LAD: insignificant  LCx: normal  RCA: insignificant

## 2019-08-10 NOTE — Unmapped (Signed)
Addendum created to file missing facility fee

## 2019-08-23 NOTE — Unmapped (Signed)
[facility] Refill Coordination Note    Specialty Medication(s) to be Shipped:   Infectious Disease: Biktarvy 50-200-25MG      Timothy Castro, DOB: 12/24/1970  Phone: [phone] (home)     All above HIPAA information was verified with patient.     Was a Nurse, learning disability used for this call? No    Completed refill call assessment today to schedule patient's medication shipment from the [facility] [phone]).       Specialty medication(s) and dose(s) confirmed: Regimen is correct and unchanged.   Changes to medications: Timothy Castro reports no changes at this time.  Changes to insurance: No  Questions for the pharmacist: No    Confirmed patient received Welcome Packet with first shipment. The patient will receive a drug information handout for each medication shipped and additional FDA Medication Guides as required.       DISEASE/MEDICATION-SPECIFIC INFORMATION        N/A    SPECIALTY MEDICATION ADHERENCE     Medication Adherence    Patient reported X missed doses in the last month: 0  Specialty Medication: Biktarvy 50-200-25mg   Patient is on additional specialty medications: No  Informant: patient  Reliability of informant: reliable        Biktarvy 50-200-25mg : 5 days of medicine on hand     SHIPPING     Shipping address confirmed in Epic.     Delivery Scheduled: Yes, Expected medication delivery date: 08/26/2019.     Medication will be delivered via Next Day Courier to the prescription address in Epic WAM.    Jaidynn Balster P Allena Katz   [facility] Specialty Technician

## 2019-08-25 MED FILL — BIKTARVY 50 MG-200 MG-25 MG TABLET: ORAL | 30 days supply | Qty: 30 | Fill #8

## 2019-08-25 MED FILL — BIKTARVY 50 MG-200 MG-25 MG TABLET: 30 days supply | Qty: 30 | Fill #8 | Status: AC

## 2019-09-19 NOTE — Unmapped (Signed)
[facility]  SUMMARY NOTE     Attempted to contact pt today at Home number to introduce Intensive Case Management services. Left message to return call.; 1st attempt, letter sent.    Discuss at next visit: Introduction to ICM       Tish Frederickson  [facility] Care Manager  [phone]

## 2019-09-22 NOTE — Unmapped (Signed)
[facility]  SUMMARY NOTE     Attempted to contact pt today at Home number to introduce Intensive Case Management services. Left message to return call.; 2nd attempt    Discuss at next visit: Introduction to ICM         Tish Frederickson  [facility] Care Manager  [phone]

## 2019-09-24 ENCOUNTER — Other Ambulatory Visit: Payer: Self-pay

## 2019-09-24 ENCOUNTER — Ambulatory Visit: Payer: BLUE CROSS/BLUE SHIELD | Attending: Internal Medicine

## 2019-09-24 DIAGNOSIS — Z23 Encounter for immunization: Secondary | ICD-10-CM

## 2019-09-24 NOTE — Progress Notes (Signed)
   Covid-19 Vaccination Clinic  Name:  Jose George    MRN: [id number] DOB: June 02, 1971  09/24/2019  Mr. Jose George was observed post Covid-19 immunization for 15 minutes without incident. He was provided with Vaccine Information Sheet and instruction to access the V-Safe system.   Mr. Jose George was instructed to call 911 with any severe reactions post vaccine: Marland Kitchen Difficulty breathing  . Swelling of face and throat  . A fast heartbeat  . A bad rash all over body  . Dizziness and weakness   Immunizations Administered    Name Date Dose VIS Date Route   Pfizer COVID-19 Vaccine 09/24/2019  2:34 PM 0.3 mL 06/17/2019 Intramuscular   Manufacturer: [employer], [employer]   Lot: [id number]   NDC: [address]-0

## 2019-09-26 NOTE — Unmapped (Signed)
[facility] Refill Coordination Note    Specialty Medication(s) to be Shipped:   Infectious Disease: Biktarvy 50-200-25MG      Timothy Castro, DOB: 1970-08-26  Phone: [phone] (home)     All above HIPAA information was verified with patient.     Was a Nurse, learning disability used for this call? No    Completed refill call assessment today to schedule patient's medication shipment from the [facility] [phone]).       Specialty medication(s) and dose(s) confirmed: Regimen is correct and unchanged.   Changes to medications: [name] reports no changes at this time.  Changes to insurance: No  Questions for the pharmacist: No    Confirmed patient received Welcome Packet with first shipment. The patient will receive a drug information handout for each medication shipped and additional FDA Medication Guides as required.       DISEASE/MEDICATION-SPECIFIC INFORMATION        N/A    SPECIALTY MEDICATION ADHERENCE     Medication Adherence    Patient reported X missed doses in the last month: 0  Specialty Medication: biktarvy  Patient is on additional specialty medications: No  Any gaps in refill history greater than 2 weeks in the last 3 months: no  Demonstrates understanding of importance of adherence: yes  Informant: patient  Reliability of informant: reliable  Confirmed plan for next specialty medication refill: delivery by pharmacy  Refills needed for supportive medications: not needed        Biktarvy 50-200-25mg : 4 days of medicine on hand     SHIPPING     Shipping address confirmed in Epic.     Delivery Scheduled: Yes, Expected medication delivery date: 09/29/2019.     Medication will be delivered via Next Day Courier to the prescription address in Epic WAM.    [name] D Scheryl Sanborn   [facility] Specialty Technician

## 2019-09-27 NOTE — Unmapped (Signed)
[facility]  SUMMARY NOTE     Attempted to contact pt today at Home number to introduce Intensive Case Management services. Left message to return call.; 3rd attempt. letter sent. No further outreach at this time.      Tish Frederickson  [facility] Care Manager  [phone]

## 2019-09-28 MED FILL — BIKTARVY 50 MG-200 MG-25 MG TABLET: ORAL | 30 days supply | Qty: 30 | Fill #9

## 2019-09-28 MED FILL — BIKTARVY 50 MG-200 MG-25 MG TABLET: 30 days supply | Qty: 30 | Fill #9 | Status: AC

## 2019-10-11 MED ORDER — CEFUROXIME AXETIL 500 MG TABLET
0 refills | 0.00000 days
Start: 2019-10-11 — End: ?

## 2019-10-11 MED ORDER — ERYTHROMYCIN 5 MG/GRAM (0.5 %) EYE OINTMENT
0 refills | 0.00000 days
Start: 2019-10-11 — End: ?

## 2019-10-11 MED ORDER — METRONIDAZOLE 500 MG TABLET
0 refills | 0.00000 days
Start: 2019-10-11 — End: ?

## 2019-10-18 ENCOUNTER — Ambulatory Visit: Payer: BLUE CROSS/BLUE SHIELD

## 2019-10-24 NOTE — Unmapped (Signed)
[facility] Refill Coordination Note    Specialty Medication(s) to be Shipped:   Infectious Disease: Biktarvy    Other medication(s) to be shipped: n/a     Timothy Castro, DOB: 1971-04-22  Phone: [phone] (home)       All above HIPAA information was verified with patient.     Was a Nurse, learning disability used for this call? No    Completed refill call assessment today to schedule patient's medication shipment from the [facility] [phone]).       Specialty medication(s) and dose(s) confirmed: Regimen is correct and unchanged.   Changes to medications: Timothy Castro reports no changes at this time.  Changes to insurance: No  Questions for the pharmacist: No    Confirmed patient received Welcome Packet with first shipment. The patient will receive a drug information handout for each medication shipped and additional FDA Medication Guides as required.       DISEASE/MEDICATION-SPECIFIC INFORMATION        N/A    SPECIALTY MEDICATION ADHERENCE     Medication Adherence    Patient reported X missed doses in the last month: 0  Specialty Medication: biktarvy                biktarvy  : 5 days of medicine on hand         SHIPPING     Shipping address confirmed in Epic.     Delivery Scheduled: Yes, Expected medication delivery date: 4/22.     Medication will be delivered via Next Day Courier to the prescription address in Epic WAM.    Timothy Castro   [facility] Specialty Technician

## 2019-10-26 MED FILL — BIKTARVY 50 MG-200 MG-25 MG TABLET: 30 days supply | Qty: 30 | Fill #10 | Status: AC

## 2019-10-26 MED FILL — BIKTARVY 50 MG-200 MG-25 MG TABLET: ORAL | 30 days supply | Qty: 30 | Fill #10

## 2019-11-02 ENCOUNTER — Ambulatory Visit: Payer: BLUE CROSS/BLUE SHIELD

## 2019-11-17 ENCOUNTER — Ambulatory Visit: Payer: Self-pay | Admitting: Family Medicine

## 2019-11-17 ENCOUNTER — Other Ambulatory Visit: Payer: Self-pay

## 2019-11-17 ENCOUNTER — Encounter: Payer: Self-pay | Admitting: Family Medicine

## 2019-11-17 DIAGNOSIS — Z202 Contact with and (suspected) exposure to infections with a predominantly sexual mode of transmission: Secondary | ICD-10-CM

## 2019-11-17 DIAGNOSIS — Z113 Encounter for screening for infections with a predominantly sexual mode of transmission: Secondary | ICD-10-CM

## 2019-11-17 MED ORDER — DOXYCYCLINE HYCLATE 100 MG TABLET
ORAL | 0.00000 days
Start: 2019-11-17 — End: 2019-11-24

## 2019-11-17 MED ORDER — CEFTRIAXONE SODIUM 500 MG IJ SOLR
500.0000 mg | Freq: Once | INTRAMUSCULAR | Status: AC
  Administered 2019-11-17: 500 mg via INTRAMUSCULAR

## 2019-11-17 MED ORDER — DOXYCYCLINE HYCLATE 100 MG PO TABS: 100.0000 mg | ORAL_TABLET | Freq: Two times a day (BID) | ORAL | 0 refills | Status: AC

## 2019-11-17 NOTE — Progress Notes (Signed)
Pt here for STD screening.Ashantia Amaral, RN 

## 2019-11-17 NOTE — Progress Notes (Signed)
[facility] Department STI clinic/screening visit  Subjective:  Jose George is a 49 y.o. male being seen today for  Chief Complaint  Patient presents with  . SEXUALLY TRANSMITTED DISEASE    STD screening     The patient reports they do not have symptoms.   Patient has the following medical conditions:   Patient Active Problem List   Diagnosis Date Noted  . PVC (premature ventricular contraction) 05/16/2018  . NSVT (nonsustained ventricular tachycardia) (HCC) 05/11/2018  . Tobacco use disorder 12/25/2017  . 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 HIV (human immunodeficiency virus infection) (HCC) 03/23/2013    HPI  Pt reports he is a contact to gonorrhea, would like treatment and screening today. He denies symptoms other than rash on legs, which he had last time he had gonorrhea 2-3 years ago.  He is taking abx for sinusitis, can't remember name.    See flowsheet for further details and programmatic requirements.    No components found for: HCV  The following portions of the patient's history were reviewed and updated as appropriate: allergies, current medications, past medical history, past social history, past surgical history and problem list.  Objective:  There were no vitals filed for this visit.   Physical Exam Constitutional:      Appearance: Normal appearance.  HENT:     Head: Normocephalic and atraumatic.     Comments: No nits or hair loss    Mouth/Throat:     Mouth: Mucous membranes are moist.     Pharynx: Oropharynx is clear. No oropharyngeal exudate or posterior oropharyngeal erythema.  Pulmonary:     Effort: Pulmonary effort is normal.  Abdominal:     General: Abdomen is flat.     Palpations: Abdomen is soft. There is no hepatomegaly or mass.     Tenderness: There is no abdominal tenderness.  Genitourinary:    Pubic Area: No rash or pubic lice.      Penis: Normal and circumcised.      Testes: Normal.     Epididymis:     Right: Normal.    Left: Normal.     Rectum: Normal.  Lymphadenopathy:     Head:     Right side of head: No preauricular or posterior auricular adenopathy.     Left side of head: No preauricular or posterior auricular adenopathy.     Cervical: No cervical adenopathy.     Upper Body:     Right upper body: No supraclavicular or axillary adenopathy.     Left upper body: No supraclavicular or axillary adenopathy.     Lower Body: No right inguinal adenopathy. No left inguinal adenopathy.  Skin:    General: Skin is warm and dry.     Findings: No rash.       Neurological:     Mental Status: He is alert and oriented to person, place, and time.       Assessment and Plan:  Jose George is a 49 y.o. male presenting to the [facility] Department for STI screening   1. Screening examination for venereal disease -Screenings today as below.  -Patient does meet criteria for Hep B and Hep C screening. Declines these screening. -Urine NAAT gonorrhea and chlamydia obtained. -Counseled on warning s/sx and when to seek care. Recommended condom use with all sex and discussed importance of condom use for STI prevention. - Chlamydia/Gonorrhea [facility] - HIV [place] [facility] - Syphilis Serology, [place] [facility] - Chlamydia/Gonorrhea [facility] -  Chlamydia/Gonorrhea [facility]  2. Gonorrhea contact -Treatment today as below. Pt weight <300 lbs. -As pt is unaware of partner's chlamydia status/testing we will treat with doxycycline as well.  -Pt counseled regarding medication. No known allergies to these medications.  -Advised no sex for 7 days after both pt and partner completes treatment and encouraged condoms with all sex. - cefTRIAXone (ROCEPHIN) injection 500 mg - doxycycline (VIBRA-TABS) 100 MG tablet; Take 1 tablet (100 mg total) by mouth 2 (two) times daily for 7 days.  Dispense: 14 tablet; Refill: 0   Return for screening as needed.  No future appointments.  Kandee Keen, PA-C

## 2019-11-17 NOTE — Progress Notes (Signed)
Pt treated for contact to [facility] per provider orders. Provider orders completed.

## 2019-11-18 NOTE — Addendum Note (Signed)
Addended by: Ann Held on: 11/18/2019 04:27 PM   Modules accepted: Orders

## 2019-11-21 NOTE — Unmapped (Signed)
Assessment/Plan:      Timothy Castro, a 49 y.o. male seen today for routine HIV followup.     HIV  Overall doing well. He rarely misses doses of Biktarvy (BIC/FTC/TAF).  Accesses meds via insurance.  CD4 counts have been over 300 for >2Y on suppressive ART.   CD4 rechecks can be annual.  Discussed ARV adherence and avoiding antacids (Tums).    Lab Results   Component Value Date    ACD4 760 12/05/2015    CD4 36 12/05/2015    HIVRS Not Detected 06/23/2018    HIVCP <40 (H) 12/23/2017     ??? Continue current therapy  ??? Checking CD4, HIV RNA, & safety labs (full return)  ??? Encouraged continued excellent ARV adherence.    GERD  ?? Food, smoking, stress are clear triggers  ?? Asked him to start daily PPI without coadministered H2 blocker or Tums  ?? Provided patient education on mitigating symptoms    Palpitations  ?? In care with Dr. Jon Billings, trial off of flecainide, on metoprolol.   ?? No recent symptoms including dyspnea, syncope (fall with ankle injury was due to carrying a ladder)  ?? Prior [facility] eval - in  2019 his cardiac MRI and cardiac cath was unremarkable    Anal dysplasia  ?? 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  ?? Was supposed to have repeat anoscopy 01/2018  ?? Looks like he can return to anal cytology - will screen today.    Tobacco use  ?? Counseled again today re smoking cessation and related this to accelerated disease with HIV, GERD, dental issues, cardiac issues.  ?? He is very aware of need for this because of cost and family hx - his dad died of lung ca at age 63 and was a smoker. Also motivated by GERD and his dental issues but stress impedes progress.  ?? Reviewed recommendations for cessation and available resources. He is interested in meeting with Lahoma Rocker PA-C to address. May be interested in varenicline but I have some concerns as he is also just starting to address mental health issues.     Mental Health  ?? Congratulated for pursuing therapy  ?? We discussed that lack of sleep may exacerbate these symptoms  ?? He will let us know if he starts additional meds or needs assistance navigating.    Sexual health & secondary prevention  Sex with men.   Lab Results   Component Value Date    RPR Nonreactive 12/23/2017    CTNAA Negative 12/23/2017    CTNAA Negative 12/23/2017    CTNAA Negative 12/23/2017    GCNAA Negative 12/23/2017    GCNAA Negative 12/23/2017    GCNAA Negative 12/23/2017    SPECTYPE Swab 12/23/2017    SPECTYPE Swab 12/23/2017    SPECTYPE Urine 12/23/2017    SPECSOURCE Rectum 12/23/2017    SPECSOURCE Throat 12/23/2017    SPECSOURCE Urine 12/23/2017     ?? GC/CT NAATs -- Checked this week at [facility]  ?? 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)    B: Rectum, posterior, biopsy  - Rectal mucosa with lymphoid nodule (1 fragment)  - No squamous mucosa present for evaluation       Communicable diseases  # TB - no longer needed; negative IGRA 09/14/13 and low/no risk  # HCV - cleared prior infection; rescreen w/RNA q1-2y    Cancer screening  # Anorectal - LGSIL with AIN1 on bx 01/2017, for repeat anoscopy 01/2018 - missed appt  # Colorectal - not yet needed  # Liver - not applicable  # Lung - SCREEN AGE 44-80 IF >30 PY & CURRENT OR QUIT <15Y AGO - not yet done  # Prostate - SCREEN AGE 70 HI-RISK, 50 OTW -- Q2-4Y - not yet done    Cardiovascular disease  # The 10-year ASCVD risk score Denman George DC Jr., et al., 2013) is: 10.2%    Immunization History   Administered Date(s) Administered   ??? INFLUENZA TIV (TRI) PF (IM) 06/08/2013   ??? Influenza Vaccine Quad (IIV4 PF) 59mo+ injectable 06/21/2014, 03/27/2015, 07/16/2016, 05/20/2017, 06/23/2018   ??? PNEUMOCOCCAL POLYSACCHARIDE 23 09/14/2013   ??? Pneumococcal Conjugate 13-Valent 06/27/2013   ??? TdaP 09/14/2013     ?? Screening ordered today: none  ?? Immunizations ordered today: COVID shot #2 - he was directed to [facility] but may elect to do this at [facility].    Preventative health  # Needs dental care - has had evaluation for full extraction but very expensive out of pocket.      Counseling services took more than 50% of today's visit time.    Disposition  Return to clinic 5-6 months or sooner if needed.    Amparo Bristol, MD, MPH   [facility]  [address]   [place], [place] [address]  Phone: [phone]   Fax: [phone]      Subjective:      Chief Complaint   HIV followup    HPI  Return patient visit for Timothy Castro, a 49 y.o. male.  Has had a difficult year. He had an abscess of his eyelid requiring I+D and an ankle injury that has resolved. Recent bout of sinusitis.  Was notified of STI exposure, tested and empirically treated (got 2 injections and doxy) at [facility]. Defers STI testing today.   GERD has been a longstanding issue but has really flared - has persistent symptoms after eating acidic foods especially but pretty much all the time now. Tried PPI briefly, also Pepcid and Tums prn.   Smoking 1ppd  Got his first COVID shot but then did not get his second due to his eyelid infection. Interested in getting shot #2.  Feeling ok overall, has decided to get help with mental health after suffering a few panic attacks. He is interested in smoking cessation.  In care with cardiology (Dr. Jon Billings), interested in establishing primary care.     Past Medical History:   Diagnosis Date   ??? HIV (human immunodeficiency virus infection) (CMS-HCC) 03/23/2013    HLA-B*[id number] negative   ??? IBS (irritable bowel syndrome)    ??? Smoker      Medications and Allergies   Reviewed and updated today. See bottom of this visit's encounter summary for details.  Current Outpatient Medications on File Prior to Visit   Medication Sig   ??? flecainide (TAMBOCOR) 100 MG tablet Take  100 mg by mouth every twelve (12) hours.   ??? bictegrav-emtricit-tenofov ala (BIKTARVY) 50-200-25 mg tablet Take 1 tablet by mouth daily.   ??? cefuroxime (CEFTIN) 500 MG tablet TAKE 1 TABLET BY MOUTH EVERY 12 HOURS   ??? erythromycin (ROMYCIN) 5 mg/gram (0.5 %) ophthalmic ointment APPLY (1CM) RIBBON INTO LOWER EYELID 3 TIMES EVERY DAY INTO THE AFFEVTED EYES   ??? metoprolol succinate (TOPROL-XL) 25 MG 24 hr tablet Take 1 tablet (25 mg total) by mouth two (2) times a day.   ??? metroNIDAZOLE (FLAGYL) 500 MG tablet TAKE 1 TABLET BY MOUTH EVERY 8 HOURS     No current facility-administered medications on file prior to visit.     No Known Allergies    Social History  Lives in [place], currently not working. 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  Sex with men, versatile.  Smoking 1 ppd, started smoking in 1992. Has been able to cut down to around 4 cig/d.    Review of Systems  As per HPI. Remainder of 10 systems reviewed, negative.      Objective:      BP 155/92 (BP Site: L Arm, BP Position: Sitting, BP Cuff Size: Large)  - Pulse 89  - Temp 36.9 ??C (98.4 ??F) (Oral)  - Ht 180.3 cm (5' 11)  - Wt 94.4 kg (208 lb 3.2 oz)  - BMI 29.04 kg/m??    Wt Readings from Last 3 Encounters:   11/23/19 94.4 kg (208 lb 3.2 oz)   08/08/19 96.1 kg (211 lb 12.8 oz)   06/23/18 83.9 kg (185 lb)     Const looks well and attentive, alert, appropriate   Eyes sclerae anicteric, noninjected OU   ENT no thrush, leukoplakia or oral lesions   Lymph no cervical or supraclavicular LAD   CV RRR. No murmurs. No rub or gallop. S1/S2.   Resp CTAB ant/post, normal work of breathing   GI flat, soft. NTND. NABS.   GU normal external male genitalia, circumcised and normal scrotal and testicular exam    Rectal normal by inspection   Skin no petechiae, ecchymoses or obvious rashes on clothed exam.    MSK no joint tenderness and normal ROM throughout   Neuro CN II-XII grossly intact, MAEE, non focal   Psych Appropriate affect. Eye contact good. Linear thoughts. Fluent speech.     Laboratory Data  Reviewed in Epic today, using Synopsis and Chart Review filters.    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

## 2019-11-21 NOTE — Unmapped (Addendum)
It was great to see you today.  1. I have referred you to Lahoma Rocker, PA-C for smoking cessation.   2. I have referred you to Dr. Marguerita Beards for primary care  3. Please go to the [facility] for your second COVID vaccine.  4. Try taking the omeprazole every day for a while and paying attention to diet. More information is attached.     ?? The ID clinic phone number is [phone].  ?? The ID clinic fax number is [phone].    Please note that your laboratory and other results may be visible to you in real time, possibly before they reach your provider. Please allow 48 hours for clinical interpretation of these results. Importantly, even if a result is flagged as abnormal, it may not be one that impacts your health.    For urgent issues on nights and weekends you may reach the ID Physician on call through the [facility] Operator at [phone].     URGENT CARE  Please call ahead to speak with the nursing staff if you are in need of an urgent appointment.       MEDICATIONS  For refills please contact your pharmacy and ask them to electronically send or fax the request to the clinic.   Please bring all medications in original bottles to every appointment.    HMAP (formerly ADAP) or [employer] Eligibility (required even if you do not receive medication through [facility])  Please remember to renew your Juanell Fairly eligibility during renewal periods which occur twice a year: January-March and July-September.     The following are needed for each renewal:   - [facility] Identification (if you don't have one, then a bill with your name and address in [place])   - proof of income (award letter, W-2, or last three check stubs)   If you are unable to come in for renewal, let us know if we can mail, fax or e-mail paperwork to you.   HMAP Contact: [phone].     Neldon Mc, MD  [facility] at [facility]  [address]   [place], [place] [address]  Phone: [phone]   Fax: [phone]     Lab info:  Your most recent CD4 T-cell counts and viral loads are below. Here are a few things to keep in mind when looking at your numbers:  ?? Our goal is to get your virus to be undetectable and keep it undetectable. If the virus is undetectable you are much more likely to stay healthy.  ?? We consider your viral load to be undetectable if it says <40 or if it says Not detected.  ?? For most people, we're checking CD4 counts every other visit (once or twice a year, or sometimes even less).  ?? It's normal for your CD4 count to be different from visit to visit.   ?? You can help by taking your medications at about the same time, every single day. If you're having trouble with taking your medications, it's important to let us know.    Lab Results   Component Value Date    ACD4 760 12/05/2015    CD4 36 12/05/2015    HIVCP <40 (H) 12/23/2017    HIVRS Not Detected 06/23/2018         Patient Education        Gastroesophageal Reflux Disease (GERD): Care Instructions  Overview     Gastroesophageal reflux disease (GERD) is the backward  flow of stomach acid into the esophagus. The esophagus is the tube that leads from your throat to your stomach. A one-way valve prevents the stomach acid from backing up into this tube. But when you have GERD, this valve does not close tightly enough. This can also cause pain and swelling in your esophagus. (This is called esophagitis.)  If you have mild GERD symptoms including heartburn, you may be able to control the problem with antacids or over-the-counter medicine. You can also make lifestyle changes to help reduce your symptoms. These include changing your diet and eating habits, such as not eating late at night and losing weight.  Follow-up care is a key part of your treatment and safety. Be sure to make and go to all appointments, and call your doctor if you are having problems. It's also a good idea to know your test results and keep a list of the medicines you take.  How can you care for yourself at home?  ?? Take your medicines exactly as prescribed. Call your doctor if you think you are having a problem with your medicine.  ?? Your doctor may recommend over-the-counter medicine. For mild or occasional indigestion, antacids, such as Tums, Gaviscon, Mylanta, or Maalox, may help. Your doctor also may recommend over-the-counter acid reducers, such as famotidine (Pepcid AC), cimetidine (Tagamet HB), or omeprazole (Prilosec). Read and follow all instructions on the label. If you use these medicines often, talk with your doctor.  ?? Change your eating habits.  ? It's best to eat several small meals instead of two or three large meals.  ? After you eat, wait 2 to 3 hours before you lie down.  ? Chocolate, mint, and alcohol can make GERD worse.  ? Spicy foods, foods that have a lot of acid (like tomatoes and oranges), and coffee can make GERD symptoms worse in some people. If your symptoms are worse after you eat a certain food, you may want to stop eating that food to see if your symptoms get better.  ?? Do not smoke or chew tobacco. Smoking can make GERD worse. If you need help quitting, talk to your doctor about stop-smoking programs and medicines. These can increase your chances of quitting for good.  ?? If you have GERD symptoms at night, raise the head of your bed 6 to 8 inches by putting the frame on blocks or placing a foam wedge under the head of your mattress. (Adding extra pillows does not work.)  ?? Do not wear tight clothing around your middle.  ?? Lose weight if you need to. Losing just 5 to 10 pounds can help.  When should you call for help?   Call your doctor now or seek immediate medical care if:  ?? ?? You have new or different belly pain.   ?? ?? Your stools are black and tarlike or have streaks of blood.   Watch closely for changes in your health, and be sure to contact your doctor if:  ?? ?? Your symptoms have not improved after 2 days.   ?? ?? Food seems to catch in your throat or chest.   Where can you learn more?  Go to [facility] at https://myuncchart.org  Select Patient Education under [employer]. Enter [phone] in the search box to learn more about Gastroesophageal Reflux Disease (GERD): Care Instructions.  Current as of: October 20, 2018??????????????????????????????Content Version: 12.8  ?? 2006-2021 Healthwise, Incorporated.   Care instructions adapted under license by [facility]. If you have  questions about a medical condition or this instruction, always ask your healthcare professional. Healthwise, Incorporated disclaims any warranty or liability for your use of this information.         Patient Education        omeprazole  Pronunciation:  oh MEP ra zol  Brand:  FIRST Omeprazole, Omeprazole + SyrSpend SF [place], PriLOSEC, PriLOSEC OTC  What is the most important information I should know about omeprazole?  Omeprazole can cause kidney problems. Tell your doctor if you are urinating less than usual, or if you have blood in your urine.  Diarrhea may be a sign of a new infection. Call your doctor if you have diarrhea that is watery or has blood in it.  Omeprazole may cause new or worsening symptoms of lupus. Tell your doctor if you have joint pain and a skin rash on your cheeks or arms that worsens in sunlight.  You may be more likely to have a broken bone while taking this medicine long term or more than once per day.   What is omeprazole?  Omeprazole is a proton pump inhibitor that decreases the amount of acid produced in the stomach.  Omeprazole is used to treat symptoms of gastroesophageal reflux disease (GERD) and other conditions caused by excess stomach acid. Omeprazole is also used to promote healing of erosive esophagitis (damage to your esophagus caused by stomach acid).  Omeprazole may also be given together with antibiotics to treat gastric ulcer caused by infection with Helicobacter pylori (H. pylori).  Over-the-counter (OTC) omeprazole is used in adults to help control heartburn that occurs 2 or more days per week. This medicine not for immediate relief of heartburn symptoms. OTC omeprazole must be taken on a regular basis for 14 days in a row.  Omeprazole may also be used for purposes not listed in this medication guide.  What should I discuss with my healthcare provider before taking omeprazole?  Heartburn can mimic early symptoms of a heart attack. Get emergency medical help if you have chest pain that spreads to your jaw or shoulder and you feel sweaty or light-headed.  You should not use omeprazole if you are allergic to it, or if:  ?? you are also allergic to medicines like omeprazole, such as esomeprazole, lansoprazole, pantoprazole, rabeprazole, Nexium, Prevacid, Protonix, and others; or  ?? you also take HIV medication that contains rilpivirine (such as Complera, [place], Odefsey, Juluca).  Ask a doctor or pharmacist if this medicine is safe to use if you have:  ?? trouble or pain with swallowing;  ?? bloody or black stools, vomit that looks like blood or coffee grounds;  ?? heartburn that has lasted for over 3 months;  ?? frequent chest pain, heartburn with wheezing;  ?? unexplained weight loss;  ?? nausea or vomiting, stomach pain;  ?? liver disease;  ?? low levels of magnesium in your blood; or  ?? osteoporosis or low bone mineral density (osteopenia).  You may be more likely to have a broken bone in your hip, wrist, or spine while taking a proton pump inhibitor long-term or more than once per day.  Talk with your doctor about ways to keep your bones healthy.  Ask a doctor before using this medicine if you are pregnant or breast-feeding.   Do not give this medicine to a child without medical advice.  How should I take omeprazole?  Follow all directions on your prescription label and read all medication guides or instruction sheets. Use the medicine exactly as  directed.  Use Prilosec OTC (over-the-counter) exactly as directed on the label, or as prescribed by your doctor.  Read and carefully follow any Instructions for Use provided with your medicine. Ask your doctor or pharmacist if you do not understand these instructions.  Shake the oral suspension (liquid) before you measure a dose. Use the dosing syringe provided, or use a medicine dose-measuring device (not a kitchen spoon).  If you cannot swallow a capsule whole, open it and sprinkle the medicine into a spoonful of applesauce. Swallow the mixture right away without chewing. Do not save it for later use.  You must dissolve omeprazole powder in a small amount of water. This mixture can either be swallowed or given through a nasogastric (NG) feeding tube using a catheter-tipped syringe.  Use this medicine for the full prescribed length of time, even if your symptoms quickly improve.  OTC omeprazole should be taken for only 14 days in a row. It may take 1 to 4 days before your symptoms improve. Allow at least 4 months to pass before you start a new 14-day course of treatment.  Call your doctor if your symptoms do not improve, or if they get worse.  Some conditions are treated with a combination of omeprazole and antibiotics. Use all medications as directed.  This medicine can affect the results of certain medical tests. Tell any doctor who treats you that you are using omeprazole.  Store at room temperature away from moisture and heat.  What happens if I miss a dose?  Take the medicine as soon as you can, but skip the missed dose if it is almost time for your next dose. Do not take two doses at one time.  What happens if I overdose?  Seek emergency medical attention or call the Poison Help line at [phone].  What should I avoid while taking omeprazole?  This medicine can cause diarrhea, which may be a sign of a new infection. If you have diarrhea that is watery or bloody, call your doctor before using anti-diarrhea medicine.  What are the possible side effects of omeprazole?  Get emergency medical help if you have signs of an allergic reaction: hives; difficulty breathing; swelling of your face, lips, tongue, or throat.  Stop using omeprazole and call your doctor at once if you have:  ?? severe stomach pain, diarrhea that is watery or bloody;  ?? new or unusual pain in your wrist, thigh, hip, or back;  ?? seizure (convulsions);  ?? kidney problems --little or no urination, blood in your urine, swelling, rapid weight gain;  ?? low magnesium --dizziness, irregular heartbeats, feeling jittery, muscle cramps, muscle spasms, cough or choking feeling; or  ?? new or worsening symptoms of lupus --joint pain, and a skin rash on your cheeks or arms that worsens in sunlight.  Taking omeprazole long-term may cause you to develop stomach growths called fundic gland polyps. Talk with your doctor about this risk.  If you use omeprazole for longer than 3 years, you could develop a vitamin B-12 deficiency. Talk to your doctor about how to manage this condition if you develop it.  Common side effects may include:  ?? stomach pain, gas;  ?? nausea, vomiting, diarrhea; or  ?? headache.  This is not a complete list of side effects and others may occur. Call your doctor for medical advice about side effects. You may report side effects to FDA at 1-800-FDA-1088.  What other drugs will affect omeprazole?  Sometimes it is not safe to  use certain medications at the same time. Some drugs can affect your blood levels of other drugs you take, which may increase side effects or make the medications less effective.  Tell your doctor about all your current medicines. Many drugs can affect omeprazole, especially:  ?? clopidogrel;  ?? methotrexate;  ?? St. John's wort; or  ?? an antibiotic --amoxicillin, clarithromycin, rifampin.  This list is not complete and many other drugs may affect omeprazole. This includes prescription and over-the-counter medicines, vitamins, and herbal products. Not all possible drug interactions are listed here.  Where can I get more information?  Your pharmacist can provide more information about omeprazole.  Remember, keep this and all other medicines out of the reach of children, never share your medicines with others, and use this medication only for the indication prescribed.   Every effort has been made to ensure that the information provided by [employer] ('[employer]') is accurate, up-to-date, and complete, but no guarantee is made to that effect. Drug information contained herein may be time sensitive. Mult[employer] information has been compiled for use by healthcare practitioners and consumers in the [place] and therefore [employer] does not warrant that uses outside of the [place] are appropriate, unless specifically indicated otherwise. Mult[employer]'s drug information does not endorse drugs, diagnose patients or recommend therapy. Mult[employer]'s drug information is an Investment banker, corporate to assist licensed healthcare practitioners in caring for their patients and/or to serve consumers viewing this service as a supplement to, and not a substitute for, the expertise, skill, knowledge and judgment of healthcare practitioners. The absence of a warning for a given drug or drug combination in no way should be construed to indicate that the drug or drug combination is safe, effective or appropriate for any given patient. Mult[employer] does not assume any responsibility for any aspect of healthcare administered with the aid of information Mult[employer] provides. The information contained herein is not intended to cover all possible uses, directions, precautions, warnings, drug interactions, allergic reactions, or adverse effects. If you have questions about the drugs you are taking, check with your doctor, nurse or pharmacist.  Copyright [phone] [employer] Version: 20.01. Revision date: 10/15/2017.  Care instructions adapted under license by [facility]. If you have questions about a medical condition or this instruction, always ask your healthcare professional. Healthwise, Incorporated disclaims any warranty or liability for your use of this information.

## 2019-11-23 ENCOUNTER — Ambulatory Visit: Admit: 2019-11-23 | Discharge: 2019-11-23 | Payer: PRIVATE HEALTH INSURANCE

## 2019-11-23 DIAGNOSIS — Z21 Asymptomatic human immunodeficiency virus [HIV] infection status: Principal | ICD-10-CM

## 2019-11-23 LAB — ALT (SGPT): Alanine aminotransferase:CCnc:Pt:Ser/Plas:Qn:: 24

## 2019-11-23 LAB — BASIC METABOLIC PANEL
ANION GAP: 9 mmol/L (ref 3–11)
BUN / CREAT RATIO: 7
CALCIUM: 9.8 mg/dL (ref 8.7–10.4)
CHLORIDE: 104 mmol/L (ref 98–107)
CO2: 24.5 mmol/L (ref 20.0–31.0)
CREATININE: 1.07 mg/dL (ref 0.60–1.10)
EGFR CKD-EPI AA MALE: >90 mL/min/{1.73_m2}
GLUCOSE RANDOM: 116 mg/dL (ref 70–179)
SODIUM: 137 mmol/L (ref 135–145)

## 2019-11-23 LAB — CBC W/ AUTO DIFF
BASOPHILS ABSOLUTE COUNT: 0 10*9/L (ref 0.0–0.1)
BASOPHILS RELATIVE PERCENT: 0.2 %
EOSINOPHILS ABSOLUTE COUNT: 0.4 10*9/L (ref 0.0–0.7)
HEMATOCRIT: 43.4 % (ref 38.0–50.0)
HEMOGLOBIN: 14.8 g/dL (ref 13.5–17.5)
LYMPHOCYTES ABSOLUTE COUNT: 3.3 10*9/L (ref 0.7–4.0)
LYMPHOCYTES RELATIVE PERCENT: 42.2 %
MEAN CORPUSCULAR HEMOGLOBIN CONC: 34.1 g/dL (ref 30.0–36.0)
MEAN CORPUSCULAR HEMOGLOBIN: 32 pg (ref 26.0–34.0)
MEAN CORPUSCULAR VOLUME: 94 fL (ref 81.0–95.0)
MEAN PLATELET VOLUME: 8.9 fL (ref 7.0–10.0)
MONOCYTES ABSOLUTE COUNT: 0.7 10*9/L (ref 0.1–1.0)
MONOCYTES RELATIVE PERCENT: 9.1 %
NEUTROPHILS RELATIVE PERCENT: 43.6 %
PLATELET COUNT: 226 10*9/L (ref 150–450)
RED BLOOD CELL COUNT: 4.61 10*12/L (ref 4.32–5.72)
RED CELL DISTRIBUTION WIDTH: 12.8 % (ref 12.0–15.0)
WBC ADJUSTED: 7.8 10*9/L (ref 3.5–10.5)

## 2019-11-23 LAB — AST (SGOT): Aspartate aminotransferase:CCnc:Pt:Ser/Plas:Qn:: 20

## 2019-11-23 LAB — BLOOD UREA NITROGEN: Urea nitrogen:MCnc:Pt:Ser/Plas:Qn:: 7 — ABNORMAL LOW

## 2019-11-23 LAB — NEUTROPHILS ABSOLUTE COUNT: Neutrophils:NCnc:Pt:Bld:Qn:Automated count: 3.4

## 2019-11-23 LAB — BILIRUBIN TOTAL: Bilirubin:MCnc:Pt:Ser/Plas:Qn:: 0.2 — ABNORMAL LOW

## 2019-11-23 MED ORDER — OMEPRAZOLE MAGNESIUM 20 MG TABLET,DELAYED RELEASE
ORAL_TABLET | Freq: Every day | ORAL | 11 refills | 30.00000 days | Status: CP
Start: 2019-11-23 — End: 2020-11-22

## 2019-11-23 NOTE — Unmapped (Signed)
Called pt to talk about RW services and Caps on Charges at our clinic. Pt declined RW services at this time. Pt has a Radiographer, therapeutic however he is only paying $28.50 and says that he can afford it. Pt said it was too much trouble to do HMAP/PCAP for such a low insurance plan.     Sherene Sires     Time Duration of intervention in minutes: 5 mins

## 2019-11-23 NOTE — Unmapped (Signed)
Larey Seat last week after stepping off patio, and twisted ankle

## 2019-11-24 LAB — ABSOLUTE CD4 CNT: Cells.CD3+CD4+:NCnc:Pt:XXX:Qn:: 1320

## 2019-11-24 LAB — LYMPH MARKER LIMITED,FLOW
ABSOLUTE CD3 CNT: 2838 {cells}/uL (ref 915–3400)
CD3% (T CELLS)": 86 % (ref 61–86)
CD4% (T HELPER)": 40 % (ref 34–58)
CD4:CD8 RATIO: 0.8 — ABNORMAL LOW (ref 0.9–4.8)

## 2019-11-24 LAB — HIV RNA, QUANTITATIVE, PCR

## 2019-11-24 LAB — HIV RNA COMMENT: Lab: 0

## 2019-11-25 ENCOUNTER — Telehealth: Admit: 2019-11-25 | Payer: PRIVATE HEALTH INSURANCE

## 2019-11-25 NOTE — Unmapped (Signed)
Sure, I left a msg for a return call today, I will call next week as well.

## 2019-11-25 NOTE — Unmapped (Signed)
Patient was texted link for video visit. I called patient as well but there was no answer. I left a discreet voicemail. Will request that he be rescheduled.

## 2019-11-25 NOTE — Unmapped (Signed)
[facility] Clinical Assessment & Refill Coordination Note    Timothy Castro, DOB: 01/20/1971  Phone: [phone] (home)     All above HIPAA information was verified with patient.     Was a Nurse, learning disability used for this call? No    Specialty Medication(s):   Infectious Disease: Biktarvy     Current Outpatient Medications   Medication Sig Dispense Refill   ??? bictegrav-emtricit-tenofov ala (BIKTARVY) 50-200-25 mg tablet Take 1 tablet by mouth daily. 30 tablet 11   ??? omeprazole (PRILOSEC) 20 MG capsule Take 1 capsule (20 mg total) by mouth daily. 30 capsule 11     No current facility-administered medications for this visit.        Changes to medications: Chase reports no changes at this time.    No Known Allergies    Changes to allergies: No    SPECIALTY MEDICATION ADHERENCE     Biktarvy 50-200-25 mg: 4 days of medicine on hand       Medication Adherence    Patient reported X missed doses in the last month: 0  Specialty Medication: Biktarvy 50-200-25  Informant: patient  Confirmed plan for next specialty medication refill: delivery by pharmacy  Refills needed for supportive medications: not needed          Specialty medication(s) dose(s) confirmed: Regimen is correct and unchanged.     Are there any concerns with adherence? No    Adherence counseling provided? Not needed    CLINICAL MANAGEMENT AND INTERVENTION      Clinical Benefit Assessment:    Do you feel the medicine is effective or helping your condition? Yes    Clinical Benefit counseling provided? Not needed    Adverse Effects Assessment:    Are you experiencing any side effects? No    Are you experiencing difficulty administering your medicine? No    Quality of Life Assessment:    How many days over the past month did your HIV  keep you from your normal activities? For example, brushing your teeth or getting up in the morning. 0    Have you discussed this with your provider? Not needed    Therapy Appropriateness:    Is therapy appropriate? Yes, therapy is appropriate and should be continued    DISEASE/MEDICATION-SPECIFIC INFORMATION      N/A    PATIENT SPECIFIC NEEDS     - Does the patient have any physical, cognitive, or cultural barriers? No    - Is the patient high risk? No     - Does the patient require a Care Management Plan? No     - Does the patient require physician intervention or other additional services (i.Timothy. nutrition, smoking cessation, social work)? No      SHIPPING     Specialty Medication(s) to be Shipped:   Infectious Disease: Biktarvy    Other medication(s) to be shipped: n/a - waiting on omeprazole to be switched to capsules to see if they are covered     Changes to insurance: No    Delivery Scheduled: Yes, Expected medication delivery date: 5/24.     Medication will be delivered via Same Day Courier to the confirmed prescription address in [facility].    The patient will receive a drug information handout for each medication shipped and additional FDA Medication Guides as required.  Verified that patient has previously received a Conservation officer, historic buildings.    All of the patient's questions and concerns have been addressed.    Timothy Castro  Timothy Castro   [facility] Specialty Pharmacist

## 2019-11-28 MED FILL — BIKTARVY 50 MG-200 MG-25 MG TABLET: 30 days supply | Qty: 30 | Fill #11 | Status: AC

## 2019-11-28 MED FILL — BIKTARVY 50 MG-200 MG-25 MG TABLET: ORAL | 30 days supply | Qty: 30 | Fill #11

## 2019-11-29 NOTE — Unmapped (Signed)
1st attempt patient did not pick up the phone. Left a vm to please give me a call back in regards  to getting scheduled for an establish care appointment  per a referral we received. (Dr. Brooke Dare: ID Clinic)

## 2019-12-01 NOTE — Unmapped (Signed)
2nd attempt patient did not pick up the phone. Left a vm to please give me a call back in regards  to getting scheduled for an establish care appointment  per a referral we received. (Dr. Brooke Dare: ID Clinic)

## 2019-12-20 DIAGNOSIS — Z21 Asymptomatic human immunodeficiency virus [HIV] infection status: Principal | ICD-10-CM

## 2019-12-20 MED ORDER — BIKTARVY 50 MG-200 MG-25 MG TABLET
ORAL_TABLET | Freq: Every day | ORAL | 11 refills | 30 days | Status: CP
Start: 2019-12-20 — End: ?
  Filled 2019-12-27: qty 30, 30d supply, fill #0

## 2019-12-26 NOTE — Unmapped (Signed)
[facility] Refill Coordination Note    Specialty Medication(s) to be Shipped:   Infectious Disease: Biktarvy    Other medication(s) to be shipped: N/A     Timothy Castro, DOB: [birth year]  Phone: [phone] (home)       All above HIPAA information was verified with patient.     Was a Nurse, learning disability used for this call? No    Completed refill call assessment today to schedule patient's medication shipment from the [facility] [phone]).       Specialty medication(s) and dose(s) confirmed: Regimen is correct and unchanged.   Changes to medications: Timothy Castro reports no changes at this time.  Changes to insurance: No  Questions for the pharmacist: No    Confirmed patient received Welcome Packet with first shipment. The patient will receive a drug information handout for each medication shipped and additional FDA Medication Guides as required.       DISEASE/MEDICATION-SPECIFIC INFORMATION        N/A    SPECIALTY MEDICATION ADHERENCE     Medication Adherence    Patient reported X missed doses in the last month: 0  Specialty Medication: biktarvy  Patient is on additional specialty medications: No        Biktarvy 50-200-25 mg: 4 days of medicine on hand     SHIPPING     Shipping address confirmed in Epic.     Delivery Scheduled: Yes, Expected medication delivery date: 12/28/2019.     Medication will be delivered via Next Day Courier to the prescription address in Epic [facility].    Timothy Castro   [facility] Specialty Technician

## 2019-12-27 MED FILL — BIKTARVY 50 MG-200 MG-25 MG TABLET: 30 days supply | Qty: 30 | Fill #0 | Status: AC

## 2019-12-31 DIAGNOSIS — W57XXXA Bitten or stung by nonvenomous insect and other nonvenomous arthropods, initial encounter: Principal | ICD-10-CM

## 2019-12-31 LAB — CBC W/ AUTO DIFF
BASOPHILS ABSOLUTE COUNT: 0.1 10*9/L (ref 0.0–0.1)
BASOPHILS RELATIVE PERCENT: 0.7 %
EOSINOPHILS ABSOLUTE COUNT: 0.2 10*9/L (ref 0.0–0.4)
EOSINOPHILS RELATIVE PERCENT: 2.5 %
HEMOGLOBIN: 14.4 g/dL (ref 13.5–17.5)
LARGE UNSTAINED CELLS: 2 % (ref 0–4)
LYMPHOCYTES ABSOLUTE COUNT: 2.7 10*9/L (ref 1.5–5.0)
LYMPHOCYTES RELATIVE PERCENT: 38.3 %
MEAN CORPUSCULAR HEMOGLOBIN CONC: 33.8 g/dL (ref 31.0–37.0)
MEAN CORPUSCULAR HEMOGLOBIN: 32.8 pg (ref 26.0–34.0)
MEAN PLATELET VOLUME: 8.7 fL (ref 7.0–10.0)
MONOCYTES ABSOLUTE COUNT: 0.4 10*9/L (ref 0.2–0.8)
MONOCYTES RELATIVE PERCENT: 5.5 %
NEUTROPHILS ABSOLUTE COUNT: 3.6 10*9/L (ref 2.0–7.5)
NEUTROPHILS RELATIVE PERCENT: 51.4 %
PLATELET COUNT: 233 10*9/L (ref 150–440)
RED BLOOD CELL COUNT: 4.39 10*12/L — ABNORMAL LOW (ref 4.50–5.90)
RED CELL DISTRIBUTION WIDTH: 13.3 % (ref 12.0–15.0)
WBC ADJUSTED: 7 10*9/L (ref 4.5–11.0)

## 2019-12-31 LAB — LYMPHOCYTES RELATIVE PERCENT: Lymphocytes/100 leukocytes:NFr:Pt:Bld:Qn:Automated count: 38.3

## 2019-12-31 MED ORDER — DOXYCYCLINE HYCLATE 100 MG CAPSULE
ORAL_CAPSULE | Freq: Two times a day (BID) | ORAL | 0 refills | 10 days | Status: CP
Start: 2019-12-31 — End: 2020-01-10

## 2020-01-01 ENCOUNTER — Encounter
Admit: 2020-01-01 | Discharge: 2020-01-01 | Disposition: A | Discharge status: Home / Self Care | Payer: PRIVATE HEALTH INSURANCE

## 2020-01-01 LAB — COMPREHENSIVE METABOLIC PANEL
ALBUMIN: 4.6 g/dL (ref 3.5–5.0)
ALKALINE PHOSPHATASE: 57 U/L (ref 38–126)
ALT (SGPT): 22 U/L (ref <50)
ANION GAP: 9 mmol/L (ref 7–15)
AST (SGOT): 29 U/L (ref 19–55)
BILIRUBIN TOTAL: 0.3 mg/dL (ref 0.0–1.2)
BLOOD UREA NITROGEN: 8 mg/dL (ref 7–21)
BUN / CREAT RATIO: 7
CALCIUM: 9.5 mg/dL (ref 8.5–10.2)
CHLORIDE: 107 mmol/L (ref 98–107)
CO2: 25 mmol/L (ref 22.0–30.0)
CREATININE: 1.22 mg/dL (ref 0.70–1.30)
EGFR CKD-EPI AA MALE: 81 mL/min/{1.73_m2} (ref >=60)
EGFR CKD-EPI NON-AA MALE: 70 mL/min/{1.73_m2} (ref >=60)
GLUCOSE RANDOM: 106 mg/dL (ref 70–179)
SODIUM: 141 mmol/L (ref 135–145)

## 2020-01-01 LAB — PROTEIN TOTAL: Protein:MCnc:Pt:Ser/Plas:Qn:: 7.5

## 2020-01-01 NOTE — Unmapped (Signed)
Patient reports tick bite a couple weeks ago to left leg. Reports now having chills, nausea, dizziness. Mild redness around bite site.

## 2020-01-01 NOTE — Unmapped (Signed)
[facility] Emergency Department Provider Note      ED Course, Assessment and Plan     Initial Clinical Impression:    December 31, 2019 10:24 PM   Timothy Castro is a 49 y.o. male as described below. On exam, patient is well-appearing with no notable rashes.  Breath sounds clear throughout without increased work of breathing.  Abdominal exam benign.  Overall, very reassuring exam.    BP 148/104  - Pulse 96  - Temp 36.9 ??C (98.4 ??F)  - Resp 20  - SpO2 97%     Differential diagnosis includes possible tickborne illness.  We will plan to treat presumptively with doxycycline 100 mg twice daily for 10 days.  We will send tickborne illness panel.  CBC and CMP were collected and were reassuring.    ED Course:  ED Course as of Dec 31 2351   Sat Dec 31, 2019   2328 Comprehensive Metabolic Panel     _____________________________________________________________________    The case was discussed with attending physician who is in agreement with the above assessment and plan    Additional Medical Decision Making     I have reviewed the vital signs and the nursing notes. Labs and radiology results that were available during my care of the patient were independently reviewed by me and considered in my medical decision making.   I independently visualized the EKG tracing if performed  I independently visualized the radiology images if performed  I reviewed the patient's prior medical records if available.  Additional history obtained from family if available    History     CHIEF COMPLAINT:   Chief Complaint   Patient presents with   ??? Tick Bite   ??? Chills       HPI: Timothy Castro is a 49 y.o. male with a past medical history notable for HIV who presents with several days of dizziness, chills and nausea in setting of tick bite.  Patient notes that he was bitten with a tick approximately 2 weeks ago.  He was able to remove the tick from his leg.  He recalls that it was white and spotted.  He has since experienced some dizziness and chills.  No objective fevers.  He has also had nausea without emesis.  Otherwise, review of systems notable for mild shortness of breath and intermittent cough.    PAST MEDICAL HISTORY/PAST SURGICAL HISTORY:   Past Medical History:   Diagnosis Date   ??? HIV (human immunodeficiency virus infection) (CMS-HCC) 03/23/2013    HLA-B*[id number] negative   ??? IBS (irritable bowel syndrome)    ??? Smoker      No past surgical history on file.    MEDICATIONS:   No current facility-administered medications for this encounter.    Current Outpatient Medications:   ???  bictegrav-emtricit-tenofov ala (BIKTARVY) 50-200-25 mg tablet, Take 1 tablet by mouth daily., Disp: 30 tablet, Rfl: 11  ???  doxycycline (VIBRAMYCIN) 100 MG capsule, Take 1 capsule (100 mg total) by mouth Two (2) times a day for 10 days., Disp: 20 capsule, Rfl: 0  ???  omeprazole (PRILOSEC) 20 MG capsule, Take 1 capsule (20 mg total) by mouth daily., Disp: 30 capsule, Rfl: 11    ALLERGIES:   Patient has no known allergies.    SOCIAL HISTORY:   Social History     Tobacco Use   ??? Smoking status: Current Every Day Smoker     Packs/day: 1.00     Types: Cigarettes  Start date: 06/09/1991   ??? Smokeless tobacco: Former Neurosurgeon   Substance Use Topics   ??? Alcohol use: No     Alcohol/week: 0.0 standard drinks     FAMILY HISTORY:  Family History   Problem Relation Age of Onset   ??? Heart disease Mother    ??? Arthritis Mother    ??? Cancer Father       Review of Systems    A 10 point review of systems was performed and is negative other than positive elements noted in HPI   Constitutional: Negative for fever.  Eyes: Negative for visual changes.  ENT: Negative for sore throat.  Cardiovascular:  Mild chest pain that is diffuse across the chest.  Respiratory: Intermittent, mild shortness of breath.  Gastrointestinal: Negative for abdominal pain, vomiting. Diarrhea as above.  Genitourinary: Negative for dysuria.  Musculoskeletal: Negative for back pain.  Skin: Negative for rash. Neurological: Negative for headaches, focal weakness or numbness.    Physical Exam     VITAL SIGNS:    BP 148/104  - Pulse 96  - Temp 36.9 ??C (98.4 ??F)  - Resp 20  - SpO2 97%     Constitutional: Alert and oriented. Well appearing and in no distress.  Eyes: Conjunctivae are normal.  ENT       Head: Normocephalic and atraumatic.       Nose: No congestion.       Mouth/Throat: Mucous membranes are moist.       Neck: No stridor.  Hematological/Lymphatic/Immunilogical: No cervical lymphadenopathy.  Cardiovascular: Regular rate and rhythm with normal S1, S2,  Normal and symmetric distal pulses are present in all extremities. Warm and well perfused.  Respiratory: Normal respiratory effort. Breath sounds are normal.  Gastrointestinal: Soft and nontender. There is no CVA tenderness.  Musculoskeletal: Normal range of motion in all extremities.       Right lower leg: No tenderness or edema.       Left lower leg: No tenderness or edema.  Neurologic: Normal speech and language. No gross focal neurologic deficits are appreciated.  Skin: Skin is warm, dry and intact. No rash noted.  Psychiatric: Mood and affect are normal. Speech and behavior are normal.    Radiology     No orders to display     Labs     Labs Reviewed   CBC W/ AUTO DIFF - Abnormal; Notable for the following components:       Result Value    RBC 4.39 (*)     Macrocytosis Slight (*)     All other components within normal limits    Narrative:     Please use the Absolute Differential for reference ranges.    CBC W/ DIFFERENTIAL    Narrative:     The following orders were created for panel order CBC w/ Differential.  Procedure                               Abnormality         Status                     ---------                               -----------         ------  CBC w/ [id number]]         Abnormal            Final result                 Please view results for these tests on the individual orders.   COMPREHENSIVE METABOLIC PANEL LYME DISEASE SEROLOGY   RICKETTSIA (RMSF) ANTIBODY   EHRLICHIA ANTIBODY       Pertinent labs & imaging results that were available during my care of the patient were reviewed by me and considered in my medical decision making (see chart for details).    Please note- This chart has been created using [employer]. Chart creation errors have been sought, but may not always be located and such creation errors, especially pronoun confusion, do NOT reflect on the standard of medical care.    Loretha Brasil, MD  [facility] Internal Medicine, PGY-3         Abigail Miyamoto, MD  Resident  12/31/19 [phone]

## 2020-01-05 ENCOUNTER — Ambulatory Visit: Admit: 2020-01-05 | Discharge: 2020-01-06 | Payer: PRIVATE HEALTH INSURANCE

## 2020-01-05 DIAGNOSIS — J329 Chronic sinusitis, unspecified: Principal | ICD-10-CM

## 2020-01-05 DIAGNOSIS — Z21 Asymptomatic human immunodeficiency virus [HIV] infection status: Principal | ICD-10-CM

## 2020-01-05 DIAGNOSIS — K219 Gastro-esophageal reflux disease without esophagitis: Principal | ICD-10-CM

## 2020-01-05 DIAGNOSIS — R519 Sinus headache: Principal | ICD-10-CM

## 2020-01-05 DIAGNOSIS — F17200 Nicotine dependence, unspecified, uncomplicated: Principal | ICD-10-CM

## 2020-01-05 DIAGNOSIS — R030 Elevated blood-pressure reading, without diagnosis of hypertension: Principal | ICD-10-CM

## 2020-01-05 MED ORDER — FLUTICASONE PROPIONATE 50 MCG/ACTUATION NASAL SPRAY,SUSPENSION
Freq: Every day | NASAL | 1 refills | 0.00000 days | Status: CP
Start: 2020-01-05 — End: ?

## 2020-01-05 MED ORDER — NICOTINE 21 MG/24 HR DAILY TRANSDERMAL PATCH
MEDICATED_PATCH | TRANSDERMAL | 1 refills | 28 days | Status: CP
Start: 2020-01-05 — End: ?

## 2020-01-05 MED ORDER — CHANTIX STARTING MONTH BOX 0.5 MG (11)-1 MG (42) TABLETS IN DOSE PACK
ORAL_TABLET | ORAL | 3 refills | 0.00000 days | Status: CP
Start: 2020-01-05 — End: ?

## 2020-01-05 MED ORDER — NICOTINE (POLACRILEX) 4 MG GUM
BUCCAL | 3 refills | 5.00000 days | Status: CP | PRN
Start: 2020-01-05 — End: ?

## 2020-01-05 NOTE — Unmapped (Signed)
[facility] at [facility]       Type of visit:  face to face    Reason for visit: Establish care    Questions / Concerns that need to be addressed: Meet and greet, discuss Covid vaccine    Screening BP- 131/99   99        Allergies reviewed: Yes    Medication reviewed: Yes  Pended refills? No        HCDM reviewed and updated in Epic:    We are working to make sure all of our patients??? wishes are updated in Epic and part of that is documenting a Environmental health practitioner for each patient  A Health Care Decision Timothy Castro is someone you choose who can make health care decisions for you if you are not able ??? who would you most want to do this for you????  keep as is                COVID-19 Vaccine Summary  Type:  Pfizer  Dates Given:  09/24/2019  Due Date for next dose:   10/15/2019                  If no: Are you interested in scheduling? Interested, wants more info    Immunization History   Administered Date(s) Administered   ??? COVID-19 VACC,MRNA,(PFIZER)(PF)(IM) 09/24/2019   ??? INFLUENZA TIV (TRI) PF (IM) 06/08/2013   ??? Influenza Vaccine Quad (IIV4 PF) 13mo+ injectable 06/21/2014, 03/27/2015, 07/16/2016, 05/20/2017, 06/23/2018   ??? PNEUMOCOCCAL POLYSACCHARIDE 23 09/14/2013   ??? Pneumococcal Conjugate 13-Valent 06/27/2013   ??? TdaP 09/14/2013       __________________________________________________________________________________________    SCREENINGS COMPLETED IN FLOWSHEETS    HARK Screening  HARK Screening  Within the last year, have you been humiliated or emotionally abused in other ways by your partner or ex-partner?: No  Within the last year, have you been afraid of your partner or ex-partner?: No  Within the last year, have you been raped or forced to have any kind of sexual activity by your partner or ex-partner?: No  Within the last year, have you been kicked, hit, slapped, or otherwise physically hurt by your partner or ex-partner?: No    AUDIT       PHQ2  PHQ-2 Total Score : 0    PHQ9 P4 Suicidality Screener                GAD7       COPD Assessment       Falls Risk

## 2020-01-05 NOTE — Unmapped (Addendum)
Internal Medicine Initial Visit        Assessment/Plan:       [name] was seen today for establish care.    Diagnoses and all orders for this visit:        Asymptomatic HIV infection (CMS-HCC)  -    Viral load not detected on Biktarvy  Ambulatory referral to Internal Medicine    Sinusitis, unspecified chronicity, unspecified location  -   Recommended him to use Flonase once a day for a month.  Is concerned about the cost.  I also advised sinus rinses.  He does have a cat.  He could benefit potentially from cetirizine.  Fluticasone propionate (FLONASE) 50 mcg/actuation nasal spray; 1 spray into each nostril daily.    Gastroesophageal reflux disease without esophagitis  Patient is on omeprazole.  He reports improvement after stopping ibuprofen.  Sinus headache  Patient has headaches.  This could be from his sinusitis.  Also in the differential is migraines and tension headaches.  He does seem to have an aura, but the headaches are also associated with sinus fullness.  Could consider propranolol prophylaxis in the future.  Elevated blood pressure  Blood pressure is slightly elevated.  He has a history of intermittent elevated blood pressure.  I encouraged him to get a pressure cuff.  I will do a phone visit with him in 1 month, to see how his blood pressures been.  And also if he is ready to stop smoking.  Noted that he has had a prior cath and cardiac MRI which have been normal in 2019.    Health Maintenance  Health Maintenance   Topic Date Due   ??? COVID-19 Vaccine (2 - Pfizer 2-dose series) 10/15/2019   ??? Influenza Vaccine (1) 03/07/2020   ??? Lipid Screening  12/24/2022   ??? DTaP/Tdap/Td Vaccines (2 - Td or Tdap) 09/15/2023   ??? Hepatitis C Screen  Completed   ??? Pneumococcal Vaccine  Completed     Has had one Covid vaccine Waiting until his sinus symptoms to improve before getting his second one. Did try to convince him not to wait.      Return in about 1 month (around 02/05/2020) for Recheck GERD.  Next Visit:   ??? Check blood pressure.   Ask about smoking cessation.    Chief Complaint:      Timothy Castro is a 49 y.o. male who presents to Establish Care for Primary Care      Subjective:     HPI  Pt has tobacco use, GERD  on background of HIV.   HIV: on Biktarvy  ENT: history of sinusitis, on flonase  GERD: aggravated by ibuprofen. Improved now. On prilosec prn.   Headaches: has [place], then top and back of head; no nausea; light does not hurt his eyes; no nausea. Getting headaches every week now. Thinks his mother had migraines.   Cardiac: palpitations; was on fleicanide. Stopped by [facility] cardiology; given metoprolol prn. ; possibly panic attacks, being seen by [facility]. Has seen about 12 cardiologists he says and nothing abnormal has been found. Psych: sleeps good and bad. No history of mental illness.   Smokes 1 ppd. Trying to get intouch with the ID dept.   No family in this state. Rare alcohol.   Exercise: tries to stay active, but does not work out.   Patient Active Problem List   Diagnosis   ??? HIV (human immunodeficiency virus infection) (CMS-HCC)   ??? Anal dysplasia   ??? Tobacco use  disorder   ??? PVC (premature ventricular contraction)   ??? Gastroesophageal reflux disease without esophagitis         Current Outpatient Medications:   ???  bictegrav-emtricit-tenofov ala (BIKTARVY) 50-200-25 mg tablet, Take 1 tablet by mouth daily., Disp: 30 tablet, Rfl: 11  ???  doxycycline (VIBRAMYCIN) 100 MG capsule, Take 1 capsule (100 mg total) by mouth Two (2) times a day for 10 days., Disp: 20 capsule, Rfl: 0  ???  fluticasone propionate (FLONASE) 50 mcg/actuation nasal spray, 1 spray into each nostril daily., Disp: 16 g, Rfl: 1  ???  omeprazole (PRILOSEC) 20 MG capsule, Take 1 capsule (20 mg total) by mouth daily., Disp: 30 capsule, Rfl: 11  ???  nicotine (NICODERM CQ) 21 mg/24 hr patch, Place 1 patch on the skin daily., Disp: 28 patch, Rfl: 1  ???  nicotine polacrilex (NICORETTE) 4 MG gum, Apply 1 each (4 mg total) to cheek every hour as needed for smoking cessation. Weeks 1-6: Chew 1 piece of gum every 1-2 hours as needed, Disp: 100 each, Rfl: 3  ???  varenicline (CHANTIX STARTING MONTH BOX) 0.5 mg (11)- 1 mg (42) tablet, Take one 0.5mg  tab once daily for 3 days,then increase to one 0.5mg  tab twice daily for 4 days,then increase to one 1mg  tab twice daily. For refills: give 60 tablets., Disp: 42 tablet, Rfl: 3     Sex with men. Partner once a month. Does not use condoms.  Timothy Castro  reports that he has been smoking cigarettes. He started smoking about 28 years ago. He has been smoking about 1.00 pack per day. He has quit using smokeless tobacco. He reports that he does not drink alcohol and does not use drugs.     The past medical history, surgical history, family history, social history, medications and allergies were reviewed in Epic.  Lives by himself.  Currently not working.  Review of Systems    The balance of 10 systems was reviewed and unremarkable except as stated above.        Objective:   PHYSICAL EXAMINATION:  BP 131/99  - Pulse 99  - Ht 180.3 cm (5' 11)  - Wt 94.3 kg (207 lb 12.8 oz)  - BMI 28.98 kg/m??   General: Well appearing, no acute distress  HEENT: Masked.  No cervical LAD, No thyromegaly or nodules. OP: drainage in back of throat  Chest: no chest wall deformities  Pulm: clear to auscultation  CV: RRR, no murmurs, gallops or rubs  Abd: S, NT, ND, No HSM.  Ext: no edema      Records Review  Wt Readings from Last 3 Encounters:   01/05/20 94.3 kg (207 lb 12.8 oz)   11/23/19 94.4 kg (208 lb 3.2 oz)   08/08/19 96.1 kg (211 lb 12.8 oz)     Last   Lab Results   Component Value Date    ACD4 1,320 11/23/2019      Lab Results   Component Value Date    HIVRS Not Detected 11/23/2019     Lab Results   Component Value Date    CREATININE 1.22 12/31/2019    CHOL 194 12/23/2017    HDL 27 (L) 12/23/2017    LDL 115 (H) 12/23/2017    NONHDL 167 12/23/2017    TRIG 258 (H) 12/23/2017      Health Maintenance Due   Topic Date Due   ??? COVID-19 Vaccine (2 - Pfizer 2-dose series) 10/15/2019      The 10-year ASCVD  risk score Denman George DC Jr., et al., 2013) is: 13%   Medication adherence and barriers to the treatment plan have been addressed. Opportunities to optimize healthy behaviors have been discussed. Patient / caregiver voiced understanding.    I personally spent 32 minutes face-to-face and non-face-to-face in the care of this patient, which includes all pre, intra, and post visit time on the date of service.

## 2020-01-05 NOTE — Unmapped (Addendum)
Try the flonase for a month. .   Try to get a blood pressure cuff. I like blood pressures at home to be less than 130/80/   Stop smoking.     Welcome to the [facility] Clinic, a part of the [facility] of Medicine and [facility]. Thank you for choosing [place] for your primary care needs.        Provider Teams  Our clinic believes in physician-directed care teams that include a variety of caregivers.  You may receive care from physicians, nurse practioners, physician assistants, pharmacists, dieticians, counselors, and/or social workers.    Your doctor may also be completing post-graduate training at [facility] [employer] [facility] in order to become a board certified in internal medicine.  These resident physicians are organized into teams of several doctors including an attending or faculty physician.  If your doctor is not available on a particular day, another doctor will take care of you.    Office Hours  8 am-5 pm Monday through Friday    Getting Ready for your Visits:  ?? Please arrive 15 minutes prior to your appointment time.  ?? Please fill out any questionnaires that your provider has asked you to complete either on paper or My [place] Chart.    Services:  ?? Same Day Care (8:30am-4:30pm daily-please call for an appointment)  ?? Laboratory (full service lab located on first floor and point of care labs)  ?? Enhanced Care Program (Diabetes, Hypertension, Heart Failure, Chronic Pain, Depression, Anticoagulation Monitoring)  ?? Artist (located adjacent to the pharmacy)  ?? Outpatient Pharmacy (co-located adjacent to the IM clinic)    Important Numbers  1. How do I schedule an appointment or get a message to my doctor?  Call [phone] or toll free [phone]. Press 1 for English or 2 for Spanish. Our fax number is [phone].    2. Same Day Clinic   I'm sick today and need an appointment during office hours. Who do I call?  ?? Call [phone] or toll free [phone], ask for an appointment in the Same Day Clinic ?? Same Day Clinic is located on the 5th floor at [address], [place] [address]    3. How do I cancel or reschedule an appointment?  ?? Call [phone] or toll free [phone].   ?? You may also cancel your appointment by using [facility] (patient portal).    4. How do I request medication refills?  Request a refill via [facility] (patient portal), call clinic at [phone] or have your pharmacy fax the request to [phone].    [facility]: [phone] *Pharmacy can mail medications to your home. You must call to request the medication be mailed.[facility]: [phone]  [facility]: [phone]    5. After Hours, Weekend, or Holidays:  It's after 5:00pm during the week or it's the weekend. How do I get medical attention?    ?? Call the [facility] 24/7 Nursing Line [phone] to get nurse advice.  ?? Go to [facility] walk-in clinic at [address], [address], [place], [place]; [phone]; 7 days a week from 9:00AM - 8:00PM.    6. Care Manager/Financial Counseling  I'm having trouble with my health because of cost, my mood, trouble getting to clinic, or where I live. How can I get help? Your Care Manager can help!  ??? Call Jennell Corner, LCSW(Bilingual) at [phone] or Carolyne Littles,  LCSW-A at [phone] for assistance.  ??? They are available Monday-Friday 8:00AM -5:00PM  ??? [facility] Internal Medicine at [facility] Counselor: Ivor Costa [phone]  ??? [facility] Internal Medicine at [facility] Counselor(Bilingual): Doyce Para [phone]  ??? [facility] Pharmacy Assistance([place] PAP): [phone]    7. [employer]  I'm feeling unsafe, have experienced physical abuse, threats, emotional abuse, sexual abuse or other violence. Who do I call for confidential advice and assistance?  ??? Call [phone] Monday through Fridays 9:00am-4:30pm. Call [phone] after hours.    8. Same Day Clinic I'm sick today and need an appointment during office hours. Who do I call?  ?? Call [phone] or toll free [phone], ask for an appointment in the Same Day Clinic  ?? Same Day Clinic is located on the 5th floor at [address], [place] [address]      Clinic Website: [email or web address]      Our clinic is proud to be recognized as a patient-centered medical home.

## 2020-01-06 NOTE — Unmapped (Signed)
Tobacco use disorder  -Patient initially seemed interested in starting therapy for smoking cessation, and I explained to him how to take the Chantix and nicotine and nicotine gum.  However, he said later that he would prefer to wait until his sinus symptoms have improved.  I have put in prescription for Chantix, and warned him of effects of nausea.  We discussed setting a start date.  I also explained nicotine patches with nicotine gum as needed.  I spent 6 minutes counseling him.   Varenicline (CHANTIX STARTING MONTH BOX) 0.5 mg (11)- 1 mg (42) tablet; Take one 0.5mg  tab once daily for 3 days,then increase to one 0.5mg  tab twice daily for 4 days,then increase to one 1mg  tab twice daily. For refills: give 60 tablets.  -     nicotine (NICODERM CQ) 21 mg/24 hr patch; Place 1 patch on the skin daily.  -     nicotine polacrilex (NICORETTE) 4 MG gum; Apply 1 each (4 mg total) to cheek every hour as needed for smoking cessation. Weeks 1-6: Chew 1 piece of gum every 1-2 hours as needed

## 2020-01-27 NOTE — Unmapped (Signed)
[facility] Refill Coordination Note    Specialty Medication(s) to be Shipped:   Infectious Disease: Biktarvy    Other medication(s) to be shipped: No additional medications requested for fill at this time     Timothy Castro, DOB: December 15, 1970  Phone: [phone] (home) [phone] (work)      All above HIPAA information was verified with patient.     Was a Nurse, learning disability used for this call? No    Completed refill call assessment today to schedule patient's medication shipment from the [facility] [phone]).       Specialty medication(s) and dose(s) confirmed: Regimen is correct and unchanged.   Changes to medications: Timothy Castro reports no changes at this time.  Changes to insurance: No  Questions for the pharmacist: No    Confirmed patient received Welcome Packet with first shipment. The patient will receive a drug information handout for each medication shipped and additional FDA Medication Guides as required.       DISEASE/MEDICATION-SPECIFIC INFORMATION        N/A    SPECIALTY MEDICATION ADHERENCE     Medication Adherence    Patient reported X missed doses in the last month: 0  Specialty Medication: biktarvy  Patient is on additional specialty medications: No  Any gaps in refill history greater than 2 weeks in the last 3 months: no  Demonstrates understanding of importance of adherence: yes  Informant: patient  Reliability of informant: reliable  Confirmed plan for next specialty medication refill: delivery by pharmacy  Refills needed for supportive medications: not needed                Biktarvy. 4 days on hand      SHIPPING     Shipping address confirmed in Epic.     Delivery Scheduled: Yes, Expected medication delivery date: 01/30/2020.     Medication will be delivered via Same Day Courier to the prescription address in Epic WAM.    Timothy Castro D Weronika Birch   [facility] Specialty Technician

## 2020-01-30 MED FILL — BIKTARVY 50 MG-200 MG-25 MG TABLET: ORAL | 30 days supply | Qty: 30 | Fill #1

## 2020-01-30 MED FILL — BIKTARVY 50 MG-200 MG-25 MG TABLET: 30 days supply | Qty: 30 | Fill #1 | Status: AC

## 2020-01-31 ENCOUNTER — Ambulatory Visit: Payer: BLUE CROSS/BLUE SHIELD | Attending: Internal Medicine

## 2020-01-31 DIAGNOSIS — Z23 Encounter for immunization: Secondary | ICD-10-CM

## 2020-01-31 NOTE — Unmapped (Signed)
[facility] Internal Medicine at [facility]       Type of visit:  video    Reason for visit:sinus pain sore throat, chest tightness since Friday   Scheduled for 2nd shot today not sure if he should get        General Consent to Treat (GCT) for non-epic video visits only: Verbal consent            Screening BP- not taken            Allergies reviewed: Yes    Medication reviewed: Yes  Pended refills? No        HCDM reviewed and updated in Epic:    We are working to make sure all of our patients??? wishes are updated in Epic and part of that is documenting a Environmental health practitioner for each patient  A Health Care Decision Rodena Piety is someone you choose who can make health care decisions for you if you are not able ??? who would you most want to do this for you????  is already up to date.        COVID-19 Vaccine Summary  Type:  Pfizer  09/24/2019                    Immunization History   Administered Date(s) Administered   ??? COVID-19 VACC,MRNA,(PFIZER)(PF)(IM) 09/24/2019   ??? INFLUENZA TIV (TRI) PF (IM) 06/08/2013   ??? Influenza Vaccine Quad (IIV4 PF) 9mo+ injectable 06/21/2014, 03/27/2015, 07/16/2016, 05/20/2017, 06/23/2018   ??? PNEUMOCOCCAL POLYSACCHARIDE 23 09/14/2013   ??? Pneumococcal Conjugate 13-Valent 06/27/2013   ??? TdaP 09/14/2013       __________________________________________________________________________________________    SCREENINGS COMPLETED IN FLOWSHEETS    HARK Screening       AUDIT       PHQ2       PHQ9          P4 Suicidality Screener                GAD7       COPD Assessment       Falls Risk

## 2020-01-31 NOTE — Unmapped (Unsigned)
Internal Medicine Initial Visit           Person Contacted: Patient  Contact Phone number: [phone] (home) [phone] (work)  Is there someone else in the room? No.     Assessment/Plan:   There are no diagnoses linked to this encounter.   Cardiovascular  # The 10-year ASCVD risk score Timothy Castro., et al., 2013) is: 13%  Health Maintenance    Next Visit:   ???   No follow-ups on file.    Chief Complaint:      Timothy Castro is a 49 y.o. male who presents for follow up.   Subjective:     HPI  Pt has a history of *** on background of HIV.   HIV: Currently on Biktarvy  Sinus issues for last couple of weeks. Had inflammation, swelling in cheeks, headaches.   Taking decongestants.  Started having chest pain last week.   Went to [facility] ER Sunday night.   Chest pain: worse when he lies on it.   Very little cough.   Shoulder to shoulder pain. Not pleuritic. Worse when sitting or lying down on back. Feels better lying on side.   Feels like he has to take a deep breaths.   Took one naprosyn; taking nasal spray.   Sinus symptoms are better.   Fevers? No.   Has a scheduled Covid vaccine scheduled today-second one. Getting second vaccine at [facility] in [place].   Has not been around anyone with Covid.   Patient Active Problem List   Diagnosis   ??? HIV (human immunodeficiency virus infection) (CMS-HCC)   ??? Anal dysplasia   ??? Tobacco use disorder   ??? PVC (premature ventricular contraction)   ??? Gastroesophageal reflux disease without esophagitis         Current Outpatient Medications:   ???  bictegrav-emtricit-tenofov ala (BIKTARVY) 50-200-25 mg tablet, Take 1 tablet by mouth daily., Disp: 30 tablet, Rfl: 11  ???  fluticasone propionate (FLONASE) 50 mcg/actuation nasal spray, 1 spray into each nostril daily., Disp: 16 g, Rfl: 1  ???  omeprazole (PRILOSEC) 20 MG capsule, Take 1 capsule (20 mg total) by mouth daily., Disp: 30 capsule, Rfl: 11  ???  nicotine (NICODERM CQ) 21 mg/24 hr patch, Place 1 patch on the skin daily. (Patient not taking: Reported on 01/31/2020), Disp: 28 patch, Rfl: 1  ???  nicotine polacrilex (NICORETTE) 4 MG gum, Apply 1 each (4 mg total) to cheek every hour as needed for smoking cessation. Weeks 1-6: Chew 1 piece of gum every 1-2 hours as needed (Patient not taking: Reported on 01/31/2020), Disp: 100 each, Rfl: 3  ???  varenicline (CHANTIX STARTING MONTH BOX) 0.5 mg (11)- 1 mg (42) tablet, Take one 0.5mg  tab once daily for 3 days,then increase to one 0.5mg  tab twice daily for 4 days,then increase to one 1mg  tab twice daily. For refills: give 60 tablets. (Patient not taking: Reported on 01/31/2020), Disp: 42 tablet, Rfl: 3     Sex with {Blank single:19197::men and women,women,men}. {Blank single:19197::Not in relationship,Monogamous with single partner,Open relationship,Single, new casual partner(s), n=***}. He {does/does not:200015} disclose status. {Blank single:19197::Never,Sometimes,Always} uses condoms.  Gabrian  reports that he has been smoking cigarettes. He started smoking about 28 years ago. He has been smoking about 1.00 pack per day. He has quit using smokeless tobacco. He reports that he does not drink alcohol and does not use drugs.       The past medical history, surgical history, family history, social history, medications  and allergies were reviewed in Epic.     Review of Systems    The balance of 10 systems was reviewed and unremarkable except as stated above.        Objective:   PHYSICAL EXAMINATION:  BP Readings from Last 3 Encounters:   01/05/20 131/99   12/31/19 148/104   11/23/19 155/92      Wt Readings from Last 3 Encounters:   01/31/20 93.9 kg (207 lb)   01/05/20 94.3 kg (207 lb 12.8 oz)   11/23/19 94.4 kg (208 lb 3.2 oz)      General appearance -   Records Review ***    Last   Lab Results   Component Value Date    ACD4 1,320 11/23/2019      Lab Results   Component Value Date    HIVRS Not Detected 11/23/2019     Lab Results   Component Value Date    CREATININE 1.22 12/31/2019    CHOL 194 12/23/2017    HDL 27 (L) 12/23/2017    LDL 115 (H) 12/23/2017    NONHDL 167 12/23/2017    TRIG 258 (H) 12/23/2017      Health Maintenance Due   Topic Date Due   ??? COVID-19 Vaccine (2 - Pfizer 2-dose series) 10/15/2019      Medication adherence and barriers to the treatment plan have been addressed. Opportunities to optimize healthy behaviors have been discussed. Patient / caregiver voiced understanding.    {    Coding tips - Do not edit this text, it will delete upon signing of note!    ?? Telephone visits [phone] for Physicians and APP??s and [phone] for Non- Physician Clinicians)- Only use minutes on the phone to determine level of service.    ?? Video visits [phone]) - Use both minutes on video and pre/post minutes to determine level of service.       :75688}    I spent *** minutes on the {phone audio video visit:67489} with the patient on the date of service. I spent an additional *** minutes on pre- and post-visit activities on the date of service.     The patient was physically located in [place] or a state in which I am permitted to provide care. The patient and/or parent/guardian understood that s/he may incur co-pays and cost sharing, and agreed to the telemedicine visit. The visit was reasonable and appropriate under the circumstances given the patient's presentation at the time.    The patient and/or parent/guardian has been advised of the potential risks and limitations of this mode of treatment (including, but not limited to, the absence of in-person examination) and has agreed to be treated using telemedicine. The patient's/patient's family's questions regarding telemedicine have been answered.     If the visit was completed in an ambulatory setting, the patient and/or parent/guardian has also been advised to contact their provider???s office for worsening conditions, and seek emergency medical treatment and/or call 911 if the patient deems either necessary.

## 2020-01-31 NOTE — Progress Notes (Addendum)
   Covid-19 Vaccination Clinic  Name:  Jose George    MRN: [id number] DOB: 06/08/1971  01/31/2020  [name] was observed post Covid-19 immunization for 15 minutes without incident. He was provided with Vaccine Information Sheet and instruction to access the V-Safe system. Patient complained of tingling all over, felt that heart rate was increasing and felt "sweaty". He voices that he takes metoprolol PRN and took last dose lastnight. VS- BP- 155.92, HR - 93, O2- 99% on RA. Water given , voices that he feels better. VS recheck, BP- 148/79, P- 83, O2- sats 99%, still voices some tingling to bilat feet.Entended observation time 15 minutes, he now voices that he has some metallic taste in mouth. He is advised to call PCP with any concerns.   [name] was instructed to call 911 with any severe reactions post vaccine: Marland Kitchen Difficulty breathing  . Swelling of face and throat  . A fast heartbeat  . A bad rash all over body  . Dizziness and weakness   Immunizations Administered    Name Date Dose VIS Date Route   Pfizer COVID-19 Vaccine 01/31/2020  2:02 PM 0.3 mL 08/31/2018 Intramuscular   Manufacturer: [employer], [employer]   Lot: [id number]   NDC: [address]-6

## 2020-02-01 NOTE — Unmapped (Signed)
Per Dr. Brooke Dare, call pt to follow up with chest pain.    Pt said, I'm better, today. Yesterday, I got the second Covid vaccine and I may have had a reaction, because my blood pressure was up. My SBP was 150/, 143/   I don't know what the bottom numbers where. I got the shot at [facility].     Pt scheduled a follow up appointment with provider on 02/22/2020.

## 2020-02-02 NOTE — Unmapped (Signed)
Voicemail not set up. Need to schedule SDC/resident due to chest pain per check out notes from Dr. Brooke Dare.    Follow-up disposition: Return in about 3 months (around 05/02/2020) for recheck BP.   Check out comments: In person.   Pt should also get [facility] appt for chest pain.     Saddie Benders  02/02/20

## 2020-02-06 MED ORDER — METOPROLOL SUCCINATE ER 25 MG TABLET,EXTENDED RELEASE 24 HR
0.00000 days
Start: 2020-02-06 — End: ?

## 2020-02-11 ENCOUNTER — Emergency Department
Admit: 2020-02-11 | Discharge: 2020-02-11 | Disposition: A | Discharge status: Home / Self Care | Payer: PRIVATE HEALTH INSURANCE

## 2020-02-11 ENCOUNTER — Encounter
Admit: 2020-02-11 | Discharge: 2020-02-11 | Disposition: A | Discharge status: Home / Self Care | Payer: PRIVATE HEALTH INSURANCE

## 2020-02-11 DIAGNOSIS — R7989 Other specified abnormal findings of blood chemistry: Principal | ICD-10-CM

## 2020-02-11 DIAGNOSIS — R079 Chest pain, unspecified: Principal | ICD-10-CM

## 2020-02-11 LAB — CBC W/ AUTO DIFF
BASOPHILS ABSOLUTE COUNT: 0.1 10*9/L (ref 0.0–0.1)
BASOPHILS RELATIVE PERCENT: 0.9 %
EOSINOPHILS ABSOLUTE COUNT: 0.3 10*9/L (ref 0.0–0.4)
EOSINOPHILS RELATIVE PERCENT: 3.6 %
HEMOGLOBIN: 15.6 g/dL (ref 13.5–17.5)
LARGE UNSTAINED CELLS: 2 % (ref 0–4)
LYMPHOCYTES ABSOLUTE COUNT: 3.3 10*9/L (ref 1.5–5.0)
LYMPHOCYTES RELATIVE PERCENT: 35.4 %
MEAN CORPUSCULAR HEMOGLOBIN CONC: 34.5 g/dL (ref 31.0–37.0)
MEAN CORPUSCULAR HEMOGLOBIN: 33 pg (ref 26.0–34.0)
MEAN CORPUSCULAR VOLUME: 95.6 fL (ref 80.0–100.0)
MEAN PLATELET VOLUME: 9.1 fL (ref 7.0–10.0)
MONOCYTES ABSOLUTE COUNT: 0.6 10*9/L (ref 0.2–0.8)
MONOCYTES RELATIVE PERCENT: 5.8 %
NEUTROPHILS RELATIVE PERCENT: 52.7 %
PLATELET COUNT: 242 10*9/L (ref 150–440)
RED BLOOD CELL COUNT: 4.75 10*12/L (ref 4.50–5.90)
WBC ADJUSTED: 9.4 10*9/L (ref 4.5–11.0)

## 2020-02-11 LAB — COMPREHENSIVE METABOLIC PANEL
ALBUMIN: 4.2 g/dL (ref 3.4–5.0)
ALKALINE PHOSPHATASE: 80 U/L (ref 46–116)
ALT (SGPT): 26 U/L (ref 10–49)
AST (SGOT): 28 U/L (ref <=34)
BILIRUBIN TOTAL: <0.2 mg/dL — ABNORMAL LOW (ref 0.3–1.2)
BLOOD UREA NITROGEN: <5 mg/dL — ABNORMAL LOW (ref 9–23)
CALCIUM: 9.6 mg/dL (ref 8.7–10.4)
CHLORIDE: 107 mmol/L (ref 98–107)
CO2: 24 mmol/L (ref 20.0–31.0)
CREATININE: 1.33 mg/dL — ABNORMAL HIGH
EGFR CKD-EPI AA MALE: 73 mL/min/{1.73_m2} (ref >=60)
EGFR CKD-EPI NON-AA MALE: 63 mL/min/{1.73_m2} (ref >=60)
GLUCOSE RANDOM: 86 mg/dL (ref 70–179)
POTASSIUM: 4.3 mmol/L (ref 3.4–4.5)
PROTEIN TOTAL: 7.8 g/dL (ref 5.7–8.2)
SODIUM: 137 mmol/L (ref 135–145)

## 2020-02-11 LAB — B-TYPE NATRIURETIC PEPTIDE
B-TYPE NATRIURETIC PEPTIDE: 4 pg/mL (ref <=100)
Natriuretic peptide.B:MCnc:Pt:Bld:Qn:: 4

## 2020-02-11 LAB — HIGH SENSITIVITY TROPONIN I: Lab: <3

## 2020-02-11 LAB — EOSINOPHILS RELATIVE PERCENT: Eosinophils/100 leukocytes:NFr:Pt:Bld:Qn:Automated count: 3.6

## 2020-02-11 LAB — BILIRUBIN TOTAL: Bilirubin:MCnc:Pt:Ser/Plas:Qn:: <0.2 — ABNORMAL LOW

## 2020-02-11 LAB — APTT
Coagulation surface induced:Time:Pt:PPP:Qn:Coag: 32.3
HEPARIN CORRELATION: 0.2

## 2020-02-11 LAB — INR: Coagulation tissue factor induced.INR:RelTime:Pt:PPP:Qn:Coag: 0.9

## 2020-02-11 MED ORDER — SUCRALFATE 1 GRAM TABLET
ORAL_TABLET | Freq: Four times a day (QID) | ORAL | 0 refills | 30 days | Status: CP
Start: 2020-02-11 — End: 2020-03-12

## 2020-02-11 MED ADMIN — lidocaine (XYLOCAINE) 2% viscous mucosal solution: 10 mL | ORAL | @ 07:00:00 | Stop: 2020-02-11

## 2020-02-11 MED ADMIN — aluminum-magnesium hydroxide-simethicone (MAALOX MAX) 80-80-8 mg/mL oral suspension: 30 mL | ORAL | @ 07:00:00 | Stop: 2020-02-11

## 2020-02-11 MED ADMIN — sucralfate (CARAFATE) oral suspension: 1 g | ORAL | @ 07:00:00 | Stop: 2020-02-11

## 2020-02-11 NOTE — Unmapped (Signed)
[facility]  Emergency Department Provider Note      ED Clinical Impression      Final diagnoses:   Chest pain, unspecified type (Primary)   Elevated serum creatinine           Impression, ED Course, Assessment and Plan      Initial clinical impression: Chest pain    Timothy Castro is a 49 y.o. male with a history of HIV on Biktarvy, GERD presents to the emergency department with 2-week history of intermittent bilateral chest pain.  Chest pain does not seem to have a trigger.  Not associated with exertion or respiration.  Patient also reports sinus congestion and intermittent headache that he is attributing to the congestion.  He has been taking over-the-counter anticongestion medication and states he has had elevated blood pressure in the setting.  He states he is taking metoprolol for which she was prescribed and not been taking given his recently elevated pressures.  Tonight, patient states he had onset of chest pain approximately 5 hours prior to arrival just after eating dinner.  The chest pain is substernal without radiation.     On exam, patient is well-appearing in no acute distress.  Lungs are clear to auscultation bilaterally.  Heart is regular rate and rhythm.  Abdomen is benign.  No peripheral edema.  Neurologically intact with normal gait and normal motor strength.  Sensation is grossly intact.    Differential diagnosis includes angina, ACS, GERD, unlikely PE    Diagnostic testing: will obtain chest pain work-up    Treatment with GI cocktail    Patient's clinical presentation concerning for angina versus GERD.  Heart score 2.  PERC negative.  Exam is otherwise reassuring.  Labs show negative work-up.  Chest x-ray is unremarkable.  Will trial GI cocktail for symptomatic relief.  Patient will follow with his primary care physician and was given return precautions.    Disposition/consultation: Discharge    Case discussed with ED attending Dr. Elita Boone who is agreeable with the plan. Additional Medical Decision Making     This patient was evaluated in Emergency Department at the time of this visit for the symptoms described in the history of present illness. They were evaluated in the context of the global COVID-19 pandemic, which necessitated consideration that the patient might be at risk for infection with the SARS-CoV-2 virus that causes COVID-19. Institutional protocols and algorithms that pertain to the evaluation of patients at risk for COVID-19 were followed during the patient's care in the ED.    I have reviewed the vital signs and the nursing notes. Labs and radiology results that were available during my care of the patient were independently reviewed by me and considered in my medical decision making.   I directly visualized and independently interpreted the EKG tracing.   I independently visualized the radiology images.   I reviewed the patient's prior medical records.   I staffed the case with the ED attending, Dr. Elita Boone.         History        Chief Complaint  Chest Pain      HPI   Timothy Castro is a 49 y.o. male with a history of HIV on Biktarvy, GERD presents to the emergency department with 2-week history of intermittent bilateral chest pain.  Chest pain does not seem to have a trigger.  Not associated with exertion or respiration.  Patient also reports sinus congestion and intermittent headache that he is attributing to the congestion.  He has been taking over-the-counter anticongestion medication and states he has had elevated blood pressure in the setting.  He states he is taking metoprolol for which she was prescribed and not been taking given his recently elevated pressures.  Tonight, patient states he had onset of chest pain approximately 5 hours prior to arrival just after eating dinner.  The chest pain is substernal without radiation.  He describes the pain as sharp in nature.  He rates his pain 4/10 in severity.  Nothing improves or worsens his pain.  Denies fever, chills, cough, shortness of breath, vomiting, abdominal pain, and rash.    Past Medical History:   Diagnosis Date   ??? HIV (human immunodeficiency virus infection) (CMS-HCC) 03/23/2013    HLA-B*5701 negative   ??? IBS (irritable bowel syndrome)    ??? Smoker        Patient Active Problem List   Diagnosis   ??? HIV (human immunodeficiency virus infection) (CMS-HCC)   ??? Anal dysplasia   ??? Tobacco use disorder   ??? PVC (premature ventricular contraction)   ??? Gastroesophageal reflux disease without esophagitis       No past surgical history on file.      Current Facility-Administered Medications:   ???  sucralfate (CARAFATE) oral suspension, 1 g, Oral, Once **AND** lidocaine (XYLOCAINE) 2% viscous mucosal solution, 10 mL, Oral, Once **AND** aluminum-magnesium hydroxide-simethicone (MAALOX MAX) 80-80-8 mg/mL oral suspension, 30 mL, Oral, Once, Anjelika Ausburn Deberah Castle, MD    Current Outpatient Medications:   ???  bictegrav-emtricit-tenofov ala (BIKTARVY) 50-200-25 mg tablet, Take 1 tablet by mouth daily., Disp: 30 tablet, Rfl: 11  ???  fluticasone propionate (FLONASE) 50 mcg/actuation nasal spray, 1 spray into each nostril daily., Disp: 16 g, Rfl: 1  ???  omeprazole (PRILOSEC) 20 MG capsule, Take 1 capsule (20 mg total) by mouth daily., Disp: 30 capsule, Rfl: 11    Allergies  Patient has no known allergies.    Family History   Problem Relation Age of Onset   ??? Heart disease Mother    ??? Arthritis Mother    ??? Cancer Father        Social History  Social History     Tobacco Use   ??? Smoking status: Current Every Day Smoker     Packs/day: 1.00     Types: Cigarettes     Start date: 06/09/1991   ??? Smokeless tobacco: Former Neurosurgeon   Substance Use Topics   ??? Alcohol use: No     Alcohol/week: 0.0 standard drinks   ??? Drug use: No       Review of Systems  Constitutional: Negative for fever.  Eyes: Negative for visual changes.  ENT: Negative for sore throat.  Cardiovascular: Positive for chest pain.  Respiratory: Negative for shortness of breath. Gastrointestinal: Negative for abdominal pain, vomiting or diarrhea.  Genitourinary: Negative for dysuria.   Musculoskeletal: Negative for back pain.  Skin: Negative for rash.  Neurological: Negative for headaches, focal weakness or numbness.    All other systems have been reviewed and are negative except as otherwise documented.     Physical Exam     ED Triage Vitals [02/11/20 0126]   Enc Vitals Group      BP 161/97      Heart Rate 88      SpO2 Pulse       Resp 16      Temp 36.7 ??C (98.1 ??F)      Temp src  SpO2 98 %      Weight 93 kg (205 lb)      Height 1.803 m (5' 11)      Head Circumference       Peak Flow       Pain Score       Pain Loc       Pain Edu?       Excl. in GC?        Constitutional: Alert and oriented. Well appearing and in no distress.  Eyes: Conjunctivae are normal.  ENT       Head: Normocephalic and atraumatic.       Nose: No congestion.       Mouth/Throat: Mucous membranes are moist.       Neck: No stridor.  Hematological/Lymphatic/Immunilogical: No cervical lymphadenopathy.  Cardiovascular: Normal rate, regular rhythm. Normal and symmetric distal pulses are present in all extremities.  Respiratory: Normal respiratory effort. Breath sounds are normal.  Gastrointestinal: Soft and nontender. There is no CVA tenderness.  Genitourinary: Deferred  Musculoskeletal: Normal range of motion in all extremities.       Right lower leg: No tenderness or edema.       Left lower leg: No tenderness or edema.  Neurologic: Pupils are equal round reactive to light bilaterally.  Extraocular muscles are intact. No nystagmus.  Face symmetric. Normal facial sensation.  Normal strength with resisted head turning.  Normal shoulder shrug.  Symmetric palate elevation.  No pronator drift.  Normal finger to nose testing. Normal grip strength.  Normal flexion and extension of the upper extremities.  Normal flexion and extension of the lower extremities.  Normal gait.   Skin: Skin is warm, dry and intact. No rash noted. Psychiatric: Mood and affect are normal. Speech and behavior are normal.     EKG     EKG: Normal sinus rhythm.  Normal rate. No ST or T wave changes to indicate ischemia.  No PR, QT, or QRS interval prolongation.      Radiology     XR Chest Portable    (Results Pending)        Labs     Labs Reviewed   COMPREHENSIVE METABOLIC PANEL - Abnormal; Notable for the following components:       Result Value    BUN <5 (*)     Creatinine 1.33 (*)     Total Bilirubin <0.2 (*)     All other components within normal limits   HIGH SENSITIVITY TROPONIN I - SINGLE - Normal   B-TYPE NATRIURETIC PEPTIDE - Normal   PROTIME-INR   APTT   CBC W/ DIFFERENTIAL    Narrative:     The following orders were created for panel order CBC w/ Differential.  Procedure                               Abnormality         Status                     ---------                               -----------         ------                     CBC w/ [id number]]  Final result                 Please view results for these tests on the individual orders.   CBC W/ AUTO DIFF    Narrative:     Please use the Absolute Differential for reference ranges.        Pertinent labs & imaging results that were available during my care of the patient were reviewed by me and considered in my medical decision making (see chart for details).    Please note- This chart has been created using [employer]. Chart creation errors have been sought, but may not always be located and such creation errors, especially pronoun confusion, do NOT reflect on the standard of medical care.       Irene Collings Deberah Castle, MD  Resident  02/11/20 [phone]

## 2020-02-11 NOTE — Unmapped (Signed)
[facility]  Emergency Department Attestation Note        ED Clinical Impression      Final diagnoses:   Chest pain, unspecified type (Primary)   Elevated serum creatinine         ED Attending Physician Teaching Attestation      I supervised care provided by the resident. We have discussed the case, I have reviewed the note and I agree with the plan of treatment except as documented in my note.    I have personally performed a face-to-face diagnostic evaluation on this patient.      ED Attending Note      ED Triage Vitals [02/11/20 0126]   Enc Vitals Group      BP 161/97      Heart Rate 88      SpO2 Pulse       Resp 16      Temp 36.7 ??C (98.1 ??F)      Temp src       SpO2 98 %      Weight 93 kg (205 lb)      Height 1.803 m (5' 11)     Timothy Castro is a 49 y.o. male with a history of HIV on Biktarvy (CD4 count of 1320 in May 2021), IBS, and GERD who presents for evaluation of approximately 2 weeks of shifting chest discomfort, worse in the midline tonight.  Patient relates a longstanding history of reflux symptoms.  In the last 2 weeks, has had shifting, bilateral chest discomfort, worse after eating and with lying flat.  Patient states that he ate tonight and then began to feel substernal chest pain.  This was once again worse with lying flat.  This has been going on for about 5 hours at presentation to the ER.  It is not exertional or pleuritic in nature.  Endorses limited pain at the time of my evaluation.  He states that his symptoms have been occurring in the setting of off and on nasal congestion, which has caused some head pressure.  He has vaccinated for COVID-19.  Symptoms also occur in the setting of reported hypertension at home.  161/97 on arrival to the ER.  He states that he resumed taking metoprolol in the last several weeks after noticing high blood pressure.  Otherwise, no personal history of heart attack or stroke.  No history of cancer.  No family history of early coronary disease. Endorses tobacco use.  He states that he uses Tums, omeprazole, and Pepto-Bismol at home for his GI symptoms.    On exam, he appears well and in no acute distress.  Lungs clear to auscultation bilaterally.  Cardiac exam unremarkable.  Abdomen soft, nontender, nondistended.  2+ radial pulse.    A/P:   Considerations include ACS, arrhythmia, pneumonia, myocarditis, pericarditis, pulmonary edema, pleural effusion, pneumothorax, and musculoskeletal pain.  High suspicion for reflux, given postprandial pain and worsening with supine position.  Low suspicion for PE or aortic dissection.  PERC negative. No concern for esophageal perforation.    We will plan for EKG, chest x-ray, basic labs, and GI cocktail.    XR Chest Portable   Preliminary Result      No acute airspace disease.        CBC unrevealing.  CMP notable for creatinine of 1.33.  This appears to be uptrending.  Recommended outpatient follow-up with PCP regarding this.  High-sensitivity troponin less than 3.  Based on timeline of symptoms and accepted [facility]  protocol with HEART Score of 2, no indication for repeat troponin.  Minimal concern for ACS. Okay for PCP f/u.    Patient subsequently discharged home for outpatient follow-up regarding rising creatinine as well as hypertension.  Prescription provided for Carafate.  Patient comfortable with plan.      Additional Medical Decision Making     This provider entered the patient's room: Yes:    ??? If this provider did not enter the room, a comprehensive physical exam was not able to be performed due to increased infection risk to themselves, other providers, staff and other patients), as well as to conserve personal protective equipment (PPE) utilization during the COVID-19 pandemic.    ??? If this provider did enter the patient room, the following was PPE worn: Surgical mask, eye protection and gloves       I have reviewed the patient's vital signs and the nursing notes. Any pertinent labs & imaging results which were available during my care of the patient were reviewed by me.     I directly visualized and independently interpreted the EKG tracing.   I independently visualized the radiology images.   I reviewed the patient's prior medical records.     Timothy Castro was evaluated in Emergency Department at the time of this visit for the symptoms described in the history of present illness. He was evaluated in the context of the global COVID-19 pandemic, which necessitated consideration that the patient might be at risk for infection with the SARS-CoV-2 virus that causes COVID-19. Institutional protocols and algorithms that pertain to the evaluation of patients at risk for COVID-19 were followed during the patient's care in the ED.    Portions of this record have been created using Scientist, clinical (histocompatibility and immunogenetics). Dictation errors have been sought, but may not have been identified and corrected.       Janit Pagan, MD  02/11/20 [phone]

## 2020-02-11 NOTE — Unmapped (Signed)
Triage EKG with normal sinus rhythm.  Normal intervals and axis.  No pathologic T wave inversions or ST segment elevations.  No delta waves or Brugada pattern.

## 2020-02-11 NOTE — Unmapped (Signed)
Patient complaining of CP x 1.5 weeks intermittently  , states CP tonight started after eating, also complaining of blurry vision and headache

## 2020-02-22 ENCOUNTER — Encounter: Admit: 2020-02-22 | Payer: PRIVATE HEALTH INSURANCE

## 2020-02-29 NOTE — Unmapped (Signed)
[facility] Refill Coordination Note    Specialty Medication(s) to be Shipped:   Infectious Disease: Biktarvy    Other medication(s) to be shipped: No additional medications requested for fill at this time     Timothy Castro, DOB: 11-Oct-1970  Phone: [phone] (home) [phone] (work)      All above HIPAA information was verified with patient.     Was a Nurse, learning disability used for this call? No    Completed refill call assessment today to schedule patient's medication shipment from the [facility] [phone]).       Specialty medication(s) and dose(s) confirmed: Regimen is correct and unchanged.   Changes to medications: Cedarius reports no changes at this time.  Changes to insurance: No  Questions for the pharmacist: No    Confirmed patient received Welcome Packet with first shipment. The patient will receive a drug information handout for each medication shipped and additional FDA Medication Guides as required.       DISEASE/MEDICATION-SPECIFIC INFORMATION        N/A    SPECIALTY MEDICATION ADHERENCE     Medication Adherence    Patient reported X missed doses in the last month: 0  Specialty Medication: biktarvy  Patient is on additional specialty medications: No  Informant: patient                Biktarvy 50-200-25 mg: 4 days of medicine on hand         SHIPPING     Shipping address confirmed in Epic.     Delivery Scheduled: Yes, Expected medication delivery date: 03/02/20.     Medication will be delivered via Next Day Courier to the prescription address in Epic WAM.    Jasper Loser   [facility] Specialty Technician

## 2020-03-01 MED FILL — BIKTARVY 50 MG-200 MG-25 MG TABLET: 30 days supply | Qty: 30 | Fill #2 | Status: AC

## 2020-03-01 MED FILL — BIKTARVY 50 MG-200 MG-25 MG TABLET: ORAL | 30 days supply | Qty: 30 | Fill #2

## 2020-03-08 ENCOUNTER — Encounter: Admit: 2020-03-08 | Discharge: 2020-03-09 | Payer: PRIVATE HEALTH INSURANCE

## 2020-03-08 DIAGNOSIS — R030 Elevated blood-pressure reading, without diagnosis of hypertension: Principal | ICD-10-CM

## 2020-03-08 DIAGNOSIS — R079 Chest pain, unspecified: Principal | ICD-10-CM

## 2020-03-08 DIAGNOSIS — F419 Anxiety disorder, unspecified: Principal | ICD-10-CM

## 2020-03-08 DIAGNOSIS — Z131 Encounter for screening for diabetes mellitus: Principal | ICD-10-CM

## 2020-03-08 DIAGNOSIS — B20 Human immunodeficiency virus [HIV] disease: Principal | ICD-10-CM

## 2020-03-08 DIAGNOSIS — K219 Gastro-esophageal reflux disease without esophagitis: Principal | ICD-10-CM

## 2020-03-08 DIAGNOSIS — E663 Overweight: Principal | ICD-10-CM

## 2020-03-08 DIAGNOSIS — E7800 Pure hypercholesterolemia, unspecified: Principal | ICD-10-CM

## 2020-03-08 DIAGNOSIS — F17200 Nicotine dependence, unspecified, uncomplicated: Principal | ICD-10-CM

## 2020-03-08 LAB — LIPID PANEL
CHOLESTEROL/HDL RATIO SCREEN: 7.1 — ABNORMAL HIGH (ref 1.0–4.5)
CHOLESTEROL: 242 mg/dL — ABNORMAL HIGH (ref <=200)
LDL CHOLESTEROL CALCULATED: 157 mg/dL — ABNORMAL HIGH (ref 40–99)
NON-HDL CHOLESTEROL: 208 mg/dL — ABNORMAL HIGH (ref 70–130)
VLDL CHOLESTEROL CAL: 50.8 mg/dL — ABNORMAL HIGH (ref 11–50)

## 2020-03-08 LAB — CREATINE KINASE TOTAL: Creatine kinase:CCnc:Pt:Ser/Plas:Qn:: 151

## 2020-03-08 LAB — TRIGLYCERIDES: Triglyceride:MCnc:Pt:Ser/Plas:Qn:: 254 — ABNORMAL HIGH

## 2020-03-08 LAB — HEMOGLOBIN A1C
HEMOGLOBIN A1C: 5.1 % (ref 4.8–5.6)
Hemoglobin A1c/Hemoglobin.total:MFr:Pt:Bld:Qn:: 5.1

## 2020-03-08 MED ORDER — ATORVASTATIN 20 MG TABLET
ORAL_TABLET | Freq: Every day | ORAL | 2 refills | 30.00000 days | Status: CN
Start: 2020-03-08 — End: 2020-06-06

## 2020-03-08 MED ORDER — SERTRALINE 50 MG TABLET
ORAL_TABLET | Freq: Every day | ORAL | 2 refills | 30 days | Status: CP
Start: 2020-03-08 — End: 2020-06-06

## 2020-03-08 NOTE — Unmapped (Signed)
Internal Medicine Follow Up            Chief Complaint:      Timothy Castro is a 49 y.o. male with a history of HIV, GERD, tobacco use disorders, who is here for f/u for recent chest pain after being seen in the ED.      Subjective:     HPI    Diabetes screening:  Endorses urinating more often, fatigue,headaches, sores not healing right,and blurry vision. Mentions this happens after he eats. Says after adjusting diet, his symptoms improved. Says he was diagnosed as prediabetic in 2007 or so but it was never checked afterwards. Says he has family members with diabetes.    HIV:  Currently taking Biktarvy.    BP:  130s/80s at home. Has cut out sodium. Walks around neighborhood at night.    Hypercholesteremia:   Due for lipid panel today    Chest Pain:  Recently seen in ED on 8/7 for chest pain with unremarkable cardiac enzymes, EKG and cardiac enzymes. No specific timing of onset of chest pain. Sharp chest pain in lateral pectorals bilaterally that can happen every couple of days. Does endorse having some feelings of anxiety and some SOB that he is not sure is secondary to chest pain or if these symptoms precede the chest pain. Has started seeing a therapist recently but has never tried a trial of SSRIs in the past.    GERD:  Is not currently taking omperazole for the past week and a half due to diet changes. Did not take sucralfate.    HM:  Wants to wait on getting flu vaccine for now.     Tobacco use disorder:   Currently still smoking x1/packs a day. Tried patch and gum a month ago but only for a day and a half.  Not working    Patient Active Problem List   Diagnosis   ??? HIV (human immunodeficiency virus infection) (CMS-HCC)   ??? Anal dysplasia   ??? Tobacco use disorder   ??? PVC (premature ventricular contraction)   ??? Gastroesophageal reflux disease without esophagitis   ??? Anxiety   ??? Diabetes mellitus screening   ??? Elevated BP without diagnosis of hypertension       The medications were reviewed in Epic. Review of Systems    The balance of 10 systems was unremarkable except as stated above.        Objective:     BP 133/85 (BP Site: L Arm, BP Position: Sitting) Comment: avg - Pulse 65  - Temp 37 ??C (98.6 ??F) (Oral)  - Ht 180.3 cm (5' 11)  - Wt 93.2 kg (205 lb 6.4 oz)  - SpO2 98%  - BMI 28.65 kg/m??   General: Well-appearing, NAD  Cardiovascular: RRR, No MRG  Pulmonary: Normal WOB, CTAB, no crackles or wheezes  MSK: Mild TTP on bilateral pectorals, laterally    The 10-year ASCVD risk score Denman George DC Jr., et al., 2013) is: 16.6%    Values used to calculate the score:      Age: 31 years      Sex: Male      Is Non-Hispanic African American: No      Diabetic: No      Tobacco smoker: Yes      Systolic Blood Pressure: 133 mmHg      Is BP treated: Yes      HDL Cholesterol: 34 mg/dL      Total Cholesterol: 242 mg/dL    Note:  For patients with SBP <90 or >200, Total Cholesterol <130 or >320, HDL <20 or >100 which are outside of the allowable range, the calculator will use these upper or lower values to calculate the patient???s risk score.      Assessment/Plan:     Timothy Castro was seen today for follow-up.    Diagnoses and all orders for this visit:    Chest Pain: Has been having bilateral chest pain located at lateral pectorals. Has endorsed symptoms, such as feelings of SOB that can coincide with the chest pain. In the setting of previously benign cardiac findings (normal EKG, normal cardiac enzymes), and extensive workup by multiple cardiologists, his symptoms could be due to underlying anxiety that is presenting with chest pain. Will start a trial of sertraline and f/u in a month to see if symptoms improve.    Secondary Differential Diagnoses:  - GERD: Endorses he was taking omeprazole around the time of his chest pain on 8/7, which makes this an unlikely cause of his chest pain. TTP on the lateral chest, bilaterally also makes this unlikely.  - Angina: Did have an elevated ASCVD today of 16.6% and elevated LDL as mentioned below, which increased the risk of developing angina. However, his pain does not seem to be related to any type of movement or exercise. EKG findings on 8/7 from ED was normal.  TTP on the lateral chest, bilaterally also makes this unlikely.   - Costochondritis: No TTP across the upper left sternum, making this unlikely.  Myopathy: Can be secondary due to integrase inhibitors as noted below.  -MSK pain-with pain noted to palpation on lateral chest, bilaterally  Diagnostic Plan:  - GAD-7 today      Therapeutic Plan:  - Start sertraline (ZOLOFT) 50 MG tablet; Take 1 tablet (50 mg total) by mouth daily.    Patient Education:  - He was informed of the common side effects of sertraline and was also informed that benefits of the medication would take 2-3 weeks to be seen. Was informed to let us know before he stops taking the medication in order to provide plans for tapering, as to avoid symptoms due to abrupt cessation.    HIV infection, unspecified symptom status (CMS-HCC): Integrase inhibitors, such as bictegravir can result in in a rare side effect increased in myopathy, rhabdomyolysis and subsequent increased in creatinine kinase. However, CK levels today were normal 151. HIV RNA was undetectable on 5/19.     Diagnostic Plan:  - CK; today    Patient Education:  -Follow-up with ID in October    Hypercholesteremia: Lipid panel today revealed elevated an LDL of 157 (RR 40-99) from 115 on 12/23/17. ASCVD score is 16.6%.    Patient Education:  - Patient was messaged on MyChart about the results of his ASCVD and lipid panel and was recommended to start a statin.    Diabetes mellitus screening: Normal A1c today of 5.1, with a glucose of 100, which is normal and not significant for any pre-diabetes.     Diagnostic Plan:  - A1c today    Elevated BP without diagnosis of hypertension: Currently well-controlled. Has a BP of 133/85 in clinic, and endorses a similar range when measuring at home. Is currently only taking metoprolol and is increasing his exercise regiment. At this time, we will not make any changes to his regiment and continue to monitor future BP levels.    Therapeutic Plan:  - Continue metoprolol succinate (TOPROL-XL) 25 MG 24 hr tablet once daily  Gastroesophageal reflux disease without esophagitis: Appears to be improving with changes in diet. Has not needed to take omeprazole as a result    Therapeutic Plan:   - Continue omeprazole (PRILOSEC) 20 MG capsule once daily if systems recur    Tobacco Use Disorder: Currently in contemplative stage towards quitting. Not currently making any attempts in smoking. Does endorse he wants to quit in the future.     Patient Education:  - Informed patient we can provide resources to help with quitting, if he is interested.      Follow-up:   - 1 month video visit  - Med check for sertraline  _________________     This note was written by [facility] [employer] medical student Alinda Deem and edited by Jacquiline Doe, MD.   I attest that I have reviewed the medical student note and that the components of the history of the present illness, the physical exam, and the assessment and plan documented were performed by me or were performed in my presence by the student where I verified the documentation and performed (or re-performed) the exam and medical decision making. Jacquiline Doe, MD

## 2020-03-08 NOTE — Unmapped (Signed)
 Internal Medicine at [facility]       Type of visit:  face to face    Reason for visit: F/U    Questions / Concerns that need to be addressed:      Hypertension:  ??? Have blood pressure cuff at home?: yes- arm cuff  ??? Regularly checking blood pressure?: yes    Omron BPs (complete if screening BP has a systolic  > 139 or diastolic > 89)  BP#1 139/85   BP#2 130/82  BP#3 131/87    Average BP 133/85  65  (please note this as a comment in vitals)         Allergies reviewed: Yes    Medication reviewed: Yes  Pended refills? No        HCDM reviewed and updated in Epic:    We are working to make sure all of our patients??? wishes are updated in Epic and part of that is documenting a Environmental health practitioner for each patient  A Health Care Decision Rodena Piety is someone you choose who can make health care decisions for you if you are not able ??? who would you most want to do this for you????  is already up to date.        BPAs completed:  PHQ9  HARK - Interpersonal Violence      COVID-19 Vaccine Summary  Type:  Complete  Dates Given:  01/31/2020                   Immunization History   Administered Date(s) Administered   ??? COVID-19 VACC,MRNA,(PFIZER)(PF)(IM) 09/24/2019, 01/31/2020   ??? INFLUENZA TIV (TRI) PF (IM) 06/08/2013   ??? Influenza Vaccine Quad (IIV4 PF) 6mo+ injectable 06/21/2014, 03/27/2015, 07/16/2016, 05/20/2017, 06/23/2018   ??? PNEUMOCOCCAL POLYSACCHARIDE 23 09/14/2013   ??? Pneumococcal Conjugate 13-Valent 06/27/2013   ??? TdaP 09/14/2013       __________________________________________________________________________________________    SCREENINGS COMPLETED IN FLOWSHEETS    HARK Screening  HARK Screening  Within the last year, have you been humiliated or emotionally abused in other ways by your partner or ex-partner?: No  Within the last year, have you been afraid of your partner or ex-partner?: No  Within the last year, have you been raped or forced to have any kind of sexual activity by your partner or ex-partner?: No  Within the last year, have you been kicked, hit, slapped, or otherwise physically hurt by your partner or ex-partner?: No    AUDIT       PHQ2       PHQ9          P4 Suicidality Screener                GAD7       COPD Assessment       Falls Risk

## 2020-04-01 DIAGNOSIS — F419 Anxiety disorder, unspecified: Principal | ICD-10-CM

## 2020-04-01 MED ORDER — SERTRALINE 50 MG TABLET
ORAL_TABLET | 1 refills | 0.00000 days
Start: 2020-04-01 — End: ?

## 2020-04-03 NOTE — Unmapped (Signed)
[facility] Refill Coordination Note    Specialty Medication(s) to be Shipped:   Infectious Disease: Biktarvy    Other medication(s) to be shipped: No additional medications requested for fill at this time     Timothy Castro, DOB: July 13, 1970  Phone: [phone] (home) [phone] (work)      All above HIPAA information was verified with patient.     Was a Nurse, learning disability used for this call? No    Completed refill call assessment today to schedule patient's medication shipment from the [facility] [phone]).       Specialty medication(s) and dose(s) confirmed: Regimen is correct and unchanged.   Changes to medications: [name] reports no changes at this time.  Changes to insurance: No  Questions for the pharmacist: No    Confirmed patient received Welcome Packet with first shipment. The patient will receive a drug information handout for each medication shipped and additional FDA Medication Guides as required.       DISEASE/MEDICATION-SPECIFIC INFORMATION        N/A    SPECIALTY MEDICATION ADHERENCE     Medication Adherence    Patient reported X missed doses in the last month: 0  Specialty Medication: biktarvy  Patient is on additional specialty medications: No  Any gaps in refill history greater than 2 weeks in the last 3 months: no  Demonstrates understanding of importance of adherence: yes  Informant: patient  Reliability of informant: reliable  Confirmed plan for next specialty medication refill: delivery by pharmacy  Refills needed for supportive medications: not needed                Biktarvy 50-200-25 mg: 1 days of medicine on hand         SHIPPING     Shipping address confirmed in Epic.     Delivery Scheduled: Yes, Expected medication delivery date: 04/04/2020.     Medication will be delivered via Same Day Courier to the prescription address in Epic WAM.    Keats Kingry D Sherrell Weir   [facility] Specialty Technician

## 2020-04-04 MED FILL — BIKTARVY 50 MG-200 MG-25 MG TABLET: 30 days supply | Qty: 30 | Fill #3 | Status: AC

## 2020-04-04 MED FILL — BIKTARVY 50 MG-200 MG-25 MG TABLET: ORAL | 30 days supply | Qty: 30 | Fill #3

## 2020-04-13 ENCOUNTER — Telehealth: Admit: 2020-04-13 | Discharge: 2020-04-14

## 2020-04-13 DIAGNOSIS — J329 Chronic sinusitis, unspecified: Principal | ICD-10-CM

## 2020-04-13 DIAGNOSIS — Z21 Asymptomatic human immunodeficiency virus [HIV] infection status: Principal | ICD-10-CM

## 2020-04-13 MED ORDER — OMEPRAZOLE 20 MG CAPSULE,DELAYED RELEASE
ORAL_CAPSULE | Freq: Every day | ORAL | 6 refills | 30.00000 days | Status: CP
Start: 2020-04-13 — End: 2021-04-13

## 2020-04-13 MED ORDER — FLUTICASONE PROPIONATE 50 MCG/ACTUATION NASAL SPRAY,SUSPENSION
Freq: Every day | NASAL | 1 refills | 0.00000 days | Status: CP
Start: 2020-04-13 — End: ?

## 2020-04-13 NOTE — Unmapped (Signed)
Internal Medicine Initial Visit           Person Contacted: Patient  Contact Phone number: [phone] (home) [phone] (work)  Is there someone else in the room? No.     Assessment/Plan:   Timothy Castro was seen today for follow-up.    Diagnoses and all orders for this visit:    Anxiety  Did not tolerate sertraline.   Reports good mood.   Will not try other pharmacologic treatment at this point  Elevated BP without diagnosis of hypertension  Reports slightly elevated BP 133/92 at home with heart rates in 80-90s.   Plan: Will increase metoprolol to 50 mg every day.   Will follow up in one month     Cardiovascular  # The 10-year ASCVD risk score Denman George DC Jr., et al., 2013) is: 15.6%   Would benefit from statin, especially as he is still smoking  Health Maintenance  Not addressed this visit  Next Visit:   ??? Discuss statin.   Return in about 4 weeks (around 05/11/2020) for recheck BP.    Chief Complaint:      Timothy Castro is a 49 y.o. male who presents for follow up.   Subjective:     HPI  Pt has a history of HIV, GERD, tobacco use disorder,  on background of HIV.   HIV: Currently on Biktarvy.   Was started last visit on sertraline. Made him feel down, depressed. Took it for 3 days.   Feeling good otherwise  No chest pain, no palpitations. Feels it's associated with drinking caffeine or salt.   BP 133/92.     Patient Active Problem List   Diagnosis   ??? HIV (human immunodeficiency virus infection) (CMS-HCC)   ??? Anal dysplasia   ??? Tobacco use disorder   ??? PVC (premature ventricular contraction)   ??? Gastroesophageal reflux disease without esophagitis   ??? Anxiety   ??? Diabetes mellitus screening   ??? Elevated BP without diagnosis of hypertension   ??? Hypercholesteremia         Current Outpatient Medications:   ???  bictegrav-emtricit-tenofov ala (BIKTARVY) 50-200-25 mg tablet, Take 1 tablet by mouth daily., Disp: 30 tablet, Rfl: 11  ???  metoprolol succinate (TOPROL-XL) 25 MG 24 hr tablet, , Disp: , Rfl:   ???  sertraline (ZOLOFT) 50 MG tablet, TAKE 1 TABLET BY MOUTH EVERY DAY, Disp: 90 tablet, Rfl: 0  ???  fluticasone propionate (FLONASE) 50 mcg/actuation nasal spray, 1 spray into each nostril daily., Disp: 16 g, Rfl: 1  ???  omeprazole (PRILOSEC) 20 MG capsule, Take 1 capsule (20 mg total) by mouth daily., Disp: 30 capsule, Rfl: 6 atus.   Muadh  reports that he has been smoking cigarettes. He started smoking about 28 years ago. He has been smoking about 1.00 pack per day. He has quit using smokeless tobacco. He reports that he does not drink alcohol and does not use drugs.       The past medical history, surgical history, family history, social history, medications and allergies were reviewed in Epic.     Review of Systems    The balance of 10 systems was reviewed and unremarkable except as stated above.        Objective:   PHYSICAL EXAMINATION:  BP Readings from Last 3 Encounters:   04/13/20 128/90   03/08/20 133/85   02/11/20 155/90      Wt Readings from Last 3 Encounters:   04/13/20 90.7 kg (200 lb)   03/08/20 93.2  kg (205 lb 6.4 oz)   02/11/20 93 kg (205 lb)      General appearance -   Looks well on video  Records Review     Last   Lab Results   Component Value Date    ACD4 1,320 11/23/2019      Lab Results   Component Value Date    HIVRS Not Detected 11/23/2019     Lab Results   Component Value Date    CREATININE 1.33 (H) 02/11/2020    CHOL 242 (H) 03/08/2020    HDL 34 (L) 03/08/2020    LDL 157 (H) 03/08/2020    NONHDL 208 (H) 03/08/2020    TRIG 254 (H) 03/08/2020    A1C 5.1 03/08/2020      Health Maintenance Due   Topic Date Due   ??? COVID-19 Vaccine (3 - Pfizer risk 3-dose series) 02/28/2020   ??? Influenza Vaccine (1) 03/07/2020      Medication adherence and barriers to the treatment plan have been addressed. Opportunities to optimize healthy behaviors have been discussed. Patient / caregiver voiced understanding.        I spent 9 minutes on the real-time audio and video with the patient on the date of service. I spent an additional 10 minutes on pre- and post-visit activities on the date of service.     The patient was physically located in [place] or a state in which I am permitted to provide care. The patient and/or parent/guardian understood that s/he may incur co-pays and cost sharing, and agreed to the telemedicine visit. The visit was reasonable and appropriate under the circumstances given the patient's presentation at the time.    The patient and/or parent/guardian has been advised of the potential risks and limitations of this mode of treatment (including, but not limited to, the absence of in-person examination) and has agreed to be treated using telemedicine. The patient's/patient's family's questions regarding telemedicine have been answered.     If the visit was completed in an ambulatory setting, the patient and/or parent/guardian has also been advised to contact their provider???s office for worsening conditions, and seek emergency medical treatment and/or call 911 if the patient deems either necessary.

## 2020-04-16 NOTE — Unmapped (Signed)
text patient. need to schedule follow up from 04/13/20 appt.    Follow-up disposition: Return in about 4 weeks (around 05/11/2020) for recheck BP.   Check out comments: Video     Saddie Benders  04/16/20

## 2020-04-17 DIAGNOSIS — J329 Chronic sinusitis, unspecified: Principal | ICD-10-CM

## 2020-04-17 DIAGNOSIS — Z21 Asymptomatic human immunodeficiency virus [HIV] infection status: Principal | ICD-10-CM

## 2020-04-17 MED ORDER — OMEPRAZOLE 20 MG CAPSULE,DELAYED RELEASE
ORAL_CAPSULE | Freq: Every day | ORAL | 6 refills | 30.00000 days | Status: CP
Start: 2020-04-17 — End: 2021-04-17

## 2020-04-17 MED ORDER — FLUTICASONE PROPIONATE 50 MCG/ACTUATION NASAL SPRAY,SUSPENSION
Freq: Every day | NASAL | 1 refills | 0.00000 days | Status: CP
Start: 2020-04-17 — End: ?

## 2020-04-17 MED ORDER — METOPROLOL SUCCINATE ER 50 MG TABLET,EXTENDED RELEASE 24 HR
ORAL_TABLET | Freq: Every day | ORAL | 5 refills | 30 days | Status: CP
Start: 2020-04-17 — End: ?

## 2020-04-17 NOTE — Unmapped (Signed)
Pharmacy requesting refill.

## 2020-04-27 NOTE — Unmapped (Signed)
[facility] Clinical Assessment & Refill Coordination Note    Emmitt Matthews, DOB: 1971/05/30  Phone: [phone] (home) [phone] (work)    All above HIPAA information was verified with patient.     Was a Nurse, learning disability used for this call? No    Specialty Medication(s):   Infectious Disease: Biktarvy     Current Outpatient Medications   Medication Sig Dispense Refill   ??? bictegrav-emtricit-tenofov ala (BIKTARVY) 50-200-25 mg tablet Take 1 tablet by mouth daily. 30 tablet 11   ??? fluticasone propionate (FLONASE) 50 mcg/actuation nasal spray 1 spray into each nostril daily. 16 g 1   ??? metoprolol succinate (TOPROL-XL) 50 MG 24 hr tablet Take 1 tablet (50 mg total) by mouth daily. 30 tablet 5   ??? omeprazole (PRILOSEC) 20 MG capsule Take 1 capsule (20 mg total) by mouth daily. 30 capsule 6     No current facility-administered medications for this visit.        Changes to medications: Torris Reports stopping the following medications: sertraline    No Known Allergies    Changes to allergies: No    SPECIALTY MEDICATION ADHERENCE     Biktarvy   : 9 days of medicine on hand     Medication Adherence    Patient reported X missed doses in the last month: 0  Specialty Medication: Biktarvy  Patient is on additional specialty medications: No  Any gaps in refill history greater than 2 weeks in the last 3 months: no  Demonstrates understanding of importance of adherence: yes  Informant: patient  Provider-estimated medication adherence level: good  Patient is at risk for Non-Adherence: No          Specialty medication(s) dose(s) confirmed: Regimen is correct and unchanged.     Are there any concerns with adherence? No    Adherence counseling provided? Not needed    CLINICAL MANAGEMENT AND INTERVENTION      Clinical Benefit Assessment:    Do you feel the medicine is effective or helping your condition? Yes    HIV ASSOCIATED LABS:     Lab Results   Component Value Date/Time    HIVRS Not Detected 11/23/2019 10:24 AM    HIVRS Not Detected 06/23/2018 03:57 PM    HIVRS Detected (A) 12/23/2017 04:49 PM    HIVRS Not Detected 06/21/2014 04:29 PM    HIVRS Not Detected 01/11/2014 04:30 PM    HIVRS Not Detected 09/14/2013 04:11 PM    HIVCP <40 (H) 12/23/2017 04:49 PM    HIVCP 49536 06/08/2013 04:34 PM    ACD4 1,320 11/23/2019 10:24 AM    ACD4 760 12/05/2015 02:14 PM    ACD4 672 06/21/2014 04:29 PM    ACD4 581 09/14/2013 04:11 PM    ACD4 350 (L) 06/08/2013 04:34 PM       Clinical Benefit counseling provided? Labs from 11/23/19 show evidence of clinical benefit    Adverse Effects Assessment:    Are you experiencing any side effects? No    Are you experiencing difficulty administering your medicine? No    Quality of Life Assessment:    How many days over the past month did your HIV  keep you from your normal activities? For example, brushing your teeth or getting up in the morning. 0    Have you discussed this with your provider? Not needed    Therapy Appropriateness:    Is therapy appropriate? Yes, therapy is appropriate and should be continued    DISEASE/MEDICATION-SPECIFIC INFORMATION  N/A    PATIENT SPECIFIC NEEDS     - Does the patient have any physical, cognitive, or cultural barriers? No    - Is the patient high risk? No    - Does the patient require a Care Management Plan? No     - Does the patient require physician intervention or other additional services (i.e. nutrition, smoking cessation, social work)? No      SHIPPING     Specialty Medication(s) to be Shipped:   Infectious Disease: Biktarvy    Other medication(s) to be shipped: No additional medications requested for fill at this time     Changes to insurance: No    Delivery Scheduled: Yes, Expected medication delivery date: 05/01/20.     Medication will be delivered via Next Day Courier to the confirmed prescription address in [facility].    The patient will receive a drug information handout for each medication shipped and additional FDA Medication Guides as required.  Verified that patient has previously received a Conservation officer, historic buildings.    All of the patient's questions and concerns have been addressed.    Roderic Palau   [facility] Specialty Pharmacist

## 2020-04-30 DIAGNOSIS — B20 Human immunodeficiency virus [HIV] disease: Principal | ICD-10-CM

## 2020-05-01 NOTE — Unmapped (Signed)
Timothy Castro 's Biktarvy shipment will be delayed as a result of the patient's insurance being terminated.      I have reached out to the patient and was unable to leave a message. We will call the patient back to reschedule the delivery upon resolution. We have not confirmed the new delivery date.

## 2020-05-07 DIAGNOSIS — B20 Human immunodeficiency virus [HIV] disease: Principal | ICD-10-CM

## 2020-05-07 NOTE — Unmapped (Signed)
Received a call from [facility], patient is no longer in the grace period. Rescheduled delivery

## 2020-05-08 MED FILL — BIKTARVY 50 MG-200 MG-25 MG TABLET: 30 days supply | Qty: 30 | Fill #4 | Status: AC

## 2020-05-08 MED FILL — BIKTARVY 50 MG-200 MG-25 MG TABLET: ORAL | 30 days supply | Qty: 30 | Fill #4

## 2020-05-08 NOTE — Unmapped (Signed)
Timothy Castro 's biktarvy shipment will be sent out  as a result of the patient is no longer in their grace period.     I have reached out to the patient and communicated the delivery change. We will reschedule the medication for the delivery date that the patient agreed upon.  We have confirmed the delivery date as 05/09/20, via next day courier.

## 2020-05-24 NOTE — Unmapped (Signed)
[facility] Refill Coordination Note    Specialty Medication(s) to be Shipped:   Infectious Disease: Biktarvy    Other medication(s) to be shipped: No additional medications requested for fill at this time     Timothy Castro, DOB: May 30, 1971  Phone: [phone] (home) [phone] (work)      All above HIPAA information was verified with patient.     Was a Nurse, learning disability used for this call? No    Completed refill call assessment today to schedule patient's medication shipment from the [facility] [phone]).       Specialty medication(s) and dose(s) confirmed: Regimen is correct and unchanged.   Changes to medications: Timothy Castro reports no changes at this time.  Changes to insurance: No  Questions for the pharmacist: No    Confirmed patient received Welcome Packet with first shipment. The patient will receive a drug information handout for each medication shipped and additional FDA Medication Guides as required.       DISEASE/MEDICATION-SPECIFIC INFORMATION        N/A    SPECIALTY MEDICATION ADHERENCE     Medication Adherence    Patient reported X missed doses in the last month: 0  Specialty Medication: Biktarvy  Patient is on additional specialty medications: No                Biktarvy 50-200-25 mg: 10 days of medicine on hand          SHIPPING     Shipping address confirmed in Epic.     Delivery Scheduled: Yes, Expected medication delivery date: 05/29/20.     Medication will be delivered via Next Day Courier to the prescription address in Epic W[facility].    Unk Lightning   [facility] Specialty Technician

## 2020-05-29 NOTE — Unmapped (Signed)
Timothy Castro 's BIKTARVY shipment will be sent out  as a result of the medication is too soon to refill until 11/24.     I have reached out to the patient and communicated the delivery change. We will reschedule the medication for the delivery date that the patient agreed upon.  We have confirmed the delivery date as 11/29, via same day courier.

## 2020-06-04 MED FILL — BIKTARVY 50 MG-200 MG-25 MG TABLET: 30 days supply | Qty: 30 | Fill #5 | Status: AC

## 2020-06-04 MED FILL — BIKTARVY 50 MG-200 MG-25 MG TABLET: ORAL | 30 days supply | Qty: 30 | Fill #5

## 2020-06-05 NOTE — Unmapped (Signed)
unable to leave vm, called patient to rescchdule his 12/1 appointment to another day , dr farel leaving early on 12/1

## 2020-06-26 NOTE — Unmapped (Signed)
[facility] Refill Coordination Note    Specialty Medication(s) to be Shipped:   Infectious Disease: Biktarvy    Other medication(s) to be shipped: No additional medications requested for fill at this time     Timothy Castro, DOB: 08-02-1970  Phone: [phone] (home) [phone] (work)      All above HIPAA information was verified with patient.     Was a Nurse, learning disability used for this call? No    Completed refill call assessment today to schedule patient's medication shipment from the [facility] [phone]).       Specialty medication(s) and dose(s) confirmed: Regimen is correct and unchanged.   Changes to medications: Timothy Castro reports no changes at this time.  Changes to insurance: No  Questions for the pharmacist: No    Confirmed patient received Welcome Packet with first shipment. The patient will receive a drug information handout for each medication shipped and additional FDA Medication Guides as required.       DISEASE/MEDICATION-SPECIFIC INFORMATION        N/A    SPECIALTY MEDICATION ADHERENCE     Medication Adherence    Patient reported X missed doses in the last month: 0  Specialty Medication: Biktarvy  Patient is on additional specialty medications: No                Biktarvy 50-200-25 mg: 11 days of medicine on hand          SHIPPING     Shipping address confirmed in Epic.     Delivery Scheduled: Yes, Expected medication delivery date: 07/04/20.     Medication will be delivered via Next Day Courier to the prescription address in Epic WAM.    Unk Lightning   [facility] Specialty Technician

## 2020-07-03 MED FILL — BIKTARVY 50 MG-200 MG-25 MG TABLET: ORAL | 30 days supply | Qty: 30 | Fill #6

## 2020-07-03 MED FILL — BIKTARVY 50 MG-200 MG-25 MG TABLET: 30 days supply | Qty: 30 | Fill #6 | Status: AC

## 2020-08-01 NOTE — Unmapped (Signed)
The [facility] has made a second and final attempt to reach this patient to refill the following medication: biktarvy.      We have left voicemails on the following phone numbers: [phone]  and have sent a MyChart message.    Dates contacted: 1/21, 1/26  Last scheduled delivery: shipped 12/28    The patient may be at risk of non-compliance with this medication. The patient should call the [facility] at [phone] (option 4) to refill medication.    Timothy Castro   [facility] Specialty Technician

## 2020-08-01 NOTE — Unmapped (Signed)
[facility] Refill Coordination Note    Specialty Medication(s) to be Shipped:   Infectious Disease: Biktarvy    Other medication(s) to be shipped: No additional medications requested for fill at this time     Timothy Castro, DOB: 03-Jan-1971  Phone: [phone] (home) [phone] (work)      All above HIPAA information was verified with patient.     Was a Nurse, learning disability used for this call? No    Completed refill call assessment today to schedule patient's medication shipment from the [facility] [phone]).       Specialty medication(s) and dose(s) confirmed: Regimen is correct and unchanged.   Changes to medications: [name] reports no changes at this time.  Changes to insurance: No  Questions for the pharmacist: No    Confirmed patient received Welcome Packet with first shipment. The patient will receive a drug information handout for each medication shipped and additional FDA Medication Guides as required.       DISEASE/MEDICATION-SPECIFIC INFORMATION        N/A    SPECIALTY MEDICATION ADHERENCE     Medication Adherence    Patient reported X missed doses in the last month: 0  Specialty Medication: biktarvy  Patient is on additional specialty medications: No  Informant: patient  Confirmed plan for next specialty medication refill: delivery by pharmacy  Refills needed for supportive medications: not needed          Refill Coordination    Has the Patients' Contact Information Changed: No  Is the Shipping Address Different: No           BIKTARVY 500-200-25 mg: 5 days of medicine on hand          SHIPPING     Shipping address confirmed in Epic.     Delivery Scheduled: Yes, Expected medication delivery date: 1/28.     Medication will be delivered via Next Day Courier to the prescription address in Epic W[facility].    Jolene Schimke   [facility] Specialty Technician

## 2020-08-03 NOTE — Unmapped (Signed)
Timothy Castro 's Biktarvy shipment will be delayed as a result of the patient's insurance being terminated.      I have reached out to the patient and left a voicemail message.  We will wait for a call back from the patient to reschedule the delivery.  We have not confirmed the new delivery date.

## 2020-08-06 NOTE — Unmapped (Signed)
Spoke with patient. Delivery is now scheduled for 2/1 via same day courier to prescription address. No questions for pharmacist.

## 2020-08-07 MED FILL — BIKTARVY 50 MG-200 MG-25 MG TABLET: ORAL | 30 days supply | Qty: 30 | Fill #7

## 2020-08-30 NOTE — Unmapped (Signed)
Received a message from [facility] to complete HMAP application with patient. Contacted patient and left the following message    Hello this is Renard Hamper with [facility] Benefits, please give [place] a call at [phone] to complete your medication assistance application, Thank you    Rhetta Mura  [facility] Clinic Benefits Counselor  Time of Intervention-52mins

## 2020-08-31 NOTE — Unmapped (Signed)
Received a message from the provider regarding patients meds. Per patient, he just started a new job and doesn't have insurance yet and not sure when it will start. Patient has about 7 days of meds remaining. Contacted [employer] for immediate access but patient was denied due to being on the program within the last 365 days. Per patient, he started working on 08/22/20 and anticipates making $16/hr working 40 hours a week. Will complete HMAP application.      Rhetta Mura  [facility] Clinic Benefits Counselor  Time of Intervention-20mins

## 2020-08-31 NOTE — Unmapped (Signed)
Patient completed HMAP and RW application.    Eligible for RW B&C grant services and Caps on Charges. IPL;FPL=244%. Expires 08/30/2021    RW Eligibility Form informing patient about RW services and Caps on charges was sent to patient.    If HMAP app: Application was submitted via secure email today to [employer] IF UMAP (uninsured) - Patient will be eligible through 04/05/21 if approved and will have to reapply in July.     Rhetta Mura  ID Clinic Benefits Counselor  Time of Intervention-65mins

## 2020-09-05 DIAGNOSIS — R030 Elevated blood-pressure reading, without diagnosis of hypertension: Principal | ICD-10-CM

## 2020-09-05 DIAGNOSIS — Z21 Asymptomatic human immunodeficiency virus [HIV] infection status: Principal | ICD-10-CM

## 2020-09-05 MED ORDER — METOPROLOL SUCCINATE ER 50 MG TABLET,EXTENDED RELEASE 24 HR
ORAL_TABLET | Freq: Every day | ORAL | 5 refills | 30 days | Status: CP
Start: 2020-09-05 — End: ?

## 2020-09-05 MED ORDER — BIKTARVY 50 MG-200 MG-25 MG TABLET
ORAL_TABLET | Freq: Every day | ORAL | 11 refills | 30 days | Status: CP
Start: 2020-09-05 — End: ?

## 2020-09-05 MED ORDER — OMEPRAZOLE 20 MG CAPSULE,DELAYED RELEASE
ORAL_CAPSULE | Freq: Every day | ORAL | 6 refills | 30 days | Status: CP
Start: 2020-09-05 — End: 2021-09-05

## 2020-09-05 NOTE — Unmapped (Signed)
Patient is working with clinic for [facility], as currently no insurance. No manufacturer assistance at the moment for the patient. Pushing call out a week.

## 2020-09-05 NOTE — Unmapped (Signed)
Returned patients call. Informed patient I sent a message to Modena Morrow from [facility] regarding his application and would let him know when I've heard back.    Rhetta Mura  ID Clinic Benefits Counselor  Time of Intervention-13mins

## 2020-09-18 NOTE — Unmapped (Signed)
The [facility] has made a second and final attempt to reach this patient to refill the following medication:Biktarvy.      We have left voicemails on the following phone numbers: [phone] and have sent a MyChart message.    Dates contacted: 03/09,03/15  Last scheduled delivery: 02/01    The patient may be at risk of non-compliance with this medication. The patient should call the [facility] at [phone] (option 4) to refill medication.    Antonietta Barcelona   [facility] Shared [facility] Specialty Technician

## 2020-09-25 NOTE — Unmapped (Signed)
This patient has been disenrolled from the [facility] specialty pharmacy services due to having HMAP/UMAP.  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

## 2020-11-07 ENCOUNTER — Encounter: Admit: 2020-11-07 | Discharge: 2020-11-07 | Payer: PRIVATE HEALTH INSURANCE

## 2020-11-07 DIAGNOSIS — K6282 Dysplasia of anus: Principal | ICD-10-CM

## 2020-11-07 DIAGNOSIS — Z21 Asymptomatic human immunodeficiency virus [HIV] infection status: Principal | ICD-10-CM

## 2020-11-07 DIAGNOSIS — R7989 Other specified abnormal findings of blood chemistry: Principal | ICD-10-CM

## 2020-11-07 DIAGNOSIS — F419 Anxiety disorder, unspecified: Principal | ICD-10-CM

## 2021-03-06 ENCOUNTER — Encounter: Payer: Self-pay | Admitting: Radiology

## 2021-03-06 ENCOUNTER — Emergency Department: Payer: Self-pay

## 2021-03-06 ENCOUNTER — Other Ambulatory Visit: Payer: Self-pay

## 2021-03-06 ENCOUNTER — Inpatient Hospital Stay
Admission: EM | Admit: 2021-03-06 | Discharge: 2021-03-07 | DRG: 392 | Disposition: A | Discharge status: Home / Self Care | Payer: Self-pay | Attending: Internal Medicine | Admitting: Internal Medicine

## 2021-03-06 ENCOUNTER — Ambulatory Visit: Admit: 2021-03-06

## 2021-03-06 DIAGNOSIS — K219 Gastro-esophageal reflux disease without esophagitis: Secondary | ICD-10-CM | POA: Diagnosis present

## 2021-03-06 DIAGNOSIS — Z21 Asymptomatic human immunodeficiency virus [HIV] infection status: Secondary | ICD-10-CM | POA: Diagnosis present

## 2021-03-06 DIAGNOSIS — Z8249 Family history of ischemic heart disease and other diseases of the circulatory system: Secondary | ICD-10-CM

## 2021-03-06 DIAGNOSIS — B20 Human immunodeficiency virus [HIV] disease: Secondary | ICD-10-CM | POA: Diagnosis present

## 2021-03-06 DIAGNOSIS — N182 Chronic kidney disease, stage 2 (mild): Secondary | ICD-10-CM | POA: Diagnosis present

## 2021-03-06 DIAGNOSIS — F17200 Nicotine dependence, unspecified, uncomplicated: Secondary | ICD-10-CM

## 2021-03-06 DIAGNOSIS — Z20822 Contact with and (suspected) exposure to covid-19: Secondary | ICD-10-CM | POA: Diagnosis present

## 2021-03-06 DIAGNOSIS — Z79899 Other long term (current) drug therapy: Secondary | ICD-10-CM

## 2021-03-06 DIAGNOSIS — F17210 Nicotine dependence, cigarettes, uncomplicated: Secondary | ICD-10-CM | POA: Diagnosis present

## 2021-03-06 DIAGNOSIS — K529 Noninfective gastroenteritis and colitis, unspecified: Principal | ICD-10-CM | POA: Diagnosis present

## 2021-03-06 LAB — URINALYSIS, COMPLETE (UACMP) WITH MICROSCOPIC
Bacteria, UA: NONE SEEN
Bilirubin Urine: NEGATIVE
Glucose, UA: NEGATIVE mg/dL
Hgb urine dipstick: NEGATIVE
Ketones, ur: 5 mg/dL — AB
Leukocytes,Ua: NEGATIVE
Nitrite: NEGATIVE
Protein, ur: 30 mg/dL — AB
Specific Gravity, Urine: 1.034 — ABNORMAL HIGH (ref 1.005–1.030)
Squamous Epithelial / HPF: NONE SEEN (ref 0–5)
pH: 5 (ref 5.0–8.0)

## 2021-03-06 LAB — CBC
HCT: 47.2 % (ref 39.0–52.0)
Hemoglobin: 17 g/dL (ref 13.0–17.0)
MCH: 33.6 pg (ref 26.0–34.0)
MCHC: 36 g/dL (ref 30.0–36.0)
MCV: 93.3 fL (ref 80.0–100.0)
Platelets: 201 10*3/uL (ref 150–400)
RBC: 5.06 MIL/uL (ref 4.22–5.81)
RDW: 12.4 % (ref 11.5–15.5)
WBC: 11.8 10*3/uL — ABNORMAL HIGH (ref 4.0–10.5)
nRBC: 0 % (ref 0.0–0.2)

## 2021-03-06 LAB — GASTROINTESTINAL PANEL BY PCR, STOOL (REPLACES STOOL CULTURE)

## 2021-03-06 LAB — COMPREHENSIVE METABOLIC PANEL
ALT: 18 U/L (ref 0–44)
AST: 19 U/L (ref 15–41)
Albumin: 4.3 g/dL (ref 3.5–5.0)
Alkaline Phosphatase: 65 U/L (ref 38–126)
Anion gap: 10 (ref 5–15)
BUN: 12 mg/dL (ref 6–20)
CO2: 25 mmol/L (ref 22–32)
Calcium: 9.5 mg/dL (ref 8.9–10.3)
Chloride: 102 mmol/L (ref 98–111)
Creatinine, Ser: 1.4 mg/dL — ABNORMAL HIGH (ref 0.61–1.24)
GFR, Estimated: >60 mL/min (ref >60)
Glucose, Bld: 117 mg/dL — ABNORMAL HIGH (ref 70–99)
Potassium: 4.4 mmol/L (ref 3.5–5.1)
Sodium: 137 mmol/L (ref 135–145)
Total Bilirubin: 0.8 mg/dL (ref 0.3–1.2)
Total Protein: 8.1 g/dL (ref 6.5–8.1)

## 2021-03-06 LAB — RESP PANEL BY RT-PCR (FLU A&B, COVID) ARPGX2
Influenza A by PCR: NEGATIVE
Influenza B by PCR: NEGATIVE
SARS Coronavirus 2 by RT PCR: NEGATIVE

## 2021-03-06 LAB — C DIFFICILE QUICK SCREEN W PCR REFLEX
C Diff antigen: NEGATIVE
C Diff interpretation: NOT DETECTED
C Diff toxin: NEGATIVE

## 2021-03-06 LAB — MAGNESIUM: Magnesium: 2.3 mg/dL (ref 1.7–2.4)

## 2021-03-06 LAB — LACTIC ACID, PLASMA: Lactic Acid, Venous: 1.5 mmol/L (ref 0.5–1.9)

## 2021-03-06 LAB — LIPASE, BLOOD: Lipase: 29 U/L (ref 11–51)

## 2021-03-06 MED ORDER — METRONIDAZOLE 500 MG/100ML IV SOLN
500.0000 mg | Freq: Three times a day (TID) | INTRAVENOUS | Status: DC
  Administered 2021-03-07 (×2): 500 mg via INTRAVENOUS
  Filled 2021-03-06 (×2): qty 100

## 2021-03-06 MED ORDER — IOHEXOL 350 MG/ML SOLN
100.0000 mL | Freq: Once | INTRAVENOUS | Status: AC | PRN
  Administered 2021-03-06: 100 mL via INTRAVENOUS
  Filled 2021-03-06: qty 100

## 2021-03-06 MED ORDER — SODIUM CHLORIDE 0.9 % IV SOLN: INTRAVENOUS | Status: DC

## 2021-03-06 MED ORDER — HYDROCODONE-ACETAMINOPHEN 5-325 MG PO TABS: 1.0000 | ORAL_TABLET | ORAL | Status: DC | PRN

## 2021-03-06 MED ORDER — CIPROFLOXACIN IN D5W 400 MG/200ML IV SOLN
400.0000 mg | Freq: Once | INTRAVENOUS | Status: AC
  Administered 2021-03-06: 400 mg via INTRAVENOUS
  Filled 2021-03-06: qty 200

## 2021-03-06 MED ORDER — SODIUM CHLORIDE 0.9 % IV BOLUS
1000.0000 mL | Freq: Once | INTRAVENOUS | Status: AC
  Administered 2021-03-06: 1000 mL via INTRAVENOUS

## 2021-03-06 MED ORDER — BICTEGRAVIR-EMTRICITAB-TENOFOV 50-200-25 MG PO TABS
1.0000 | ORAL_TABLET | Freq: Every day | ORAL | Status: DC
  Administered 2021-03-07: 1 via ORAL
  Filled 2021-03-06: qty 1

## 2021-03-06 MED ORDER — METRONIDAZOLE 500 MG/100ML IV SOLN
500.0000 mg | Freq: Once | INTRAVENOUS | Status: AC
  Administered 2021-03-06: 500 mg via INTRAVENOUS
  Filled 2021-03-06: qty 100

## 2021-03-06 MED ORDER — FENTANYL CITRATE PF 50 MCG/ML IJ SOSY: 12.5000 ug | PREFILLED_SYRINGE | INTRAMUSCULAR | Status: DC | PRN

## 2021-03-06 MED ORDER — ACETAMINOPHEN 325 MG PO TABS: 650.0000 mg | ORAL_TABLET | Freq: Four times a day (QID) | ORAL | Status: DC | PRN

## 2021-03-06 MED ORDER — CIPROFLOXACIN IN D5W 400 MG/200ML IV SOLN
400.0000 mg | Freq: Two times a day (BID) | INTRAVENOUS | Status: DC
  Administered 2021-03-07: 400 mg via INTRAVENOUS
  Filled 2021-03-06: qty 200

## 2021-03-06 MED ORDER — ACETAMINOPHEN 650 MG RE SUPP: 650.0000 mg | Freq: Four times a day (QID) | RECTAL | Status: DC | PRN

## 2021-03-06 NOTE — ED Triage Notes (Signed)
Pt here with abd pain since Mon. Pt states pain is centered and lower and does not radiate. Pt denies N/V but has diarrhea.

## 2021-03-06 NOTE — ED Provider Notes (Signed)
[facility] Emergency Department Provider Note    Event Date/Time   First MD Initiated Contact with Patient 03/06/21 1721     (approximate)  I have reviewed the triage vital signs and the nursing notes.   HISTORY  Chief Complaint Abdominal Pain    HPI Jose George is a 50 y.o. male below listed past medical history presents to the ER for evaluation periumbilical abdominal pain associated with what he describes as "blonde colored "stools.  Is never had pain like this before.  States mild to moderate.  No measured fevers but has had some chills.  Denies any chest pain or pressure.  No previous abdominal surgeries.  Past Medical History:  Diagnosis Date   HIV (human immunodeficiency virus infection) (HCC)    Palpitations    No family history on file. No past surgical history on file. 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 Authorizing Provider  bictegravir-emtricitabine-tenofovir AF (BIKTARVY) 50-200-25 MG TABS tablet Take by mouth. 11/30/18   [provider]    Allergies Patient has no known allergies.    Social History Social History   Tobacco Use   Smoking status: Every Day    Packs/day: 0.10    Types: Cigarettes   Smokeless tobacco: Never  Vaping Use   Vaping Use: Never used  Substance Use Topics   Alcohol use: No   Drug use: Never    Review of Systems Patient denies headaches, rhinorrhea, blurry vision, numbness, shortness of breath, chest pain, edema, cough, abdominal pain, nausea, vomiting, diarrhea, dysuria, fevers, rashes or hallucinations unless otherwise stated above in  HPI. ____________________________________________   PHYSICAL EXAM:  VITAL SIGNS: Vitals:   03/06/21 1747 03/06/21 2000  BP: (!) 135/99 (!) 143/90  Pulse: 95 76  Resp: 17   Temp:    SpO2: 96% 97%    Constitutional: Alert and oriented.  Eyes: Conjunctivae are normal.  Head: Atraumatic. Nose: No congestion/rhinnorhea. Mouth/Throat: Mucous membranes are moist.   Neck: No stridor. Painless ROM.  Cardiovascular: Normal rate, regular rhythm. Grossly normal heart sounds.  Good peripheral circulation. Respiratory: Normal respiratory effort.  No retractions. Lungs CTAB. Gastrointestinal: Soft with mild generalized ttp.  No guarding or rebound. No distention. No abdominal bruits. No CVA tenderness. Genitourinary:  Musculoskeletal: No lower extremity tenderness nor edema.  No joint effusions. Neurologic:  Normal speech and language. No gross focal neurologic deficits are appreciated. No facial droop Skin:  Skin is warm, dry and intact. No rash noted. Psychiatric: Mood and affect are normal. Speech and behavior are normal.  ____________________________________________   LABS (all labs ordered are listed, but only abnormal results are displayed)  Results for orders placed or performed during the hospital encounter of 03/06/21 (from the past 24 hour(s))  Lipase, blood     Status: None   Collection Time: 03/06/21  2:52 PM  Result Value Ref Range   Lipase 29 11 - 51 U/L  Comprehensive metabolic panel     Status: Abnormal   Collection Time: 03/06/21  2:52 PM  Result Value Ref Range   Sodium 137 135 - 145 mmol/L   Potassium 4.4 3.5 - 5.1 mmol/L   Chloride 102 98 -  111 mmol/L   CO2 25 22 - 32 mmol/L   Glucose, Bld 117 (H) 70 - 99 mg/dL   BUN 12 6 - 20 mg/dL   Creatinine, Ser 6.76 (H) 0.61 - 1.24 mg/dL   Calcium 9.5 8.9 - 19.5 mg/dL   Total Protein 8.1 6.5 - 8.1 g/dL   Albumin 4.3 3.5 - 5.0 g/dL   AST 19 15 - 41 U/L   ALT 18 0 - 44 U/L   Alkaline Phosphatase 65 38 - 126 U/L    Total Bilirubin 0.8 0.3 - 1.2 mg/dL   GFR, Estimated >09 >32 mL/min   Anion gap 10 5 - 15  CBC     Status: Abnormal   Collection Time: 03/06/21  2:52 PM  Result Value Ref Range   WBC 11.8 (H) 4.0 - 10.5 K/uL   RBC 5.06 4.22 - 5.81 MIL/uL   Hemoglobin 17.0 13.0 - 17.0 g/dL   HCT 67.1 24.5 - 80.9 %   MCV 93.3 80.0 - 100.0 fL   MCH 33.6 26.0 - 34.0 pg   MCHC 36.0 30.0 - 36.0 g/dL   RDW 98.3 38.2 - 50.5 %   Platelets 201 150 - 400 K/uL   nRBC 0.0 0.0 - 0.2 %  Urinalysis, Complete w Microscopic     Status: Abnormal   Collection Time: 03/06/21  5:54 PM  Result Value Ref Range   Color, Urine AMBER (A) YELLOW   APPearance HAZY (A) CLEAR   Specific Gravity, Urine 1.034 (H) 1.005 - 1.030   pH 5.0 5.0 - 8.0   Glucose, UA NEGATIVE NEGATIVE mg/dL   Hgb urine dipstick NEGATIVE NEGATIVE   Bilirubin Urine NEGATIVE NEGATIVE   Ketones, ur 5 (A) NEGATIVE mg/dL   Protein, ur 30 (A) NEGATIVE mg/dL   Nitrite NEGATIVE NEGATIVE   Leukocytes,Ua NEGATIVE NEGATIVE   Squamous Epithelial / LPF NONE SEEN 0 - 5   WBC, UA 0-5 0 - 5 WBC/hpf   RBC / HPF 0-5 0 - 5 RBC/hpf   Bacteria, UA NONE SEEN NONE SEEN   ____________________________________________  EKG My review and personal interpretation at Time: 14:54   Indication: abd pain  Rate: 120  Rhythm: sinus Axis: normal Other: nonspecific st abn, inferior twi ____________________________________________  RADIOLOGY   ____________________________________________   PROCEDURES  Procedure(s) performed:  Procedures    Critical Care performed: no ____________________________________________   INITIAL IMPRESSION / ASSESSMENT AND PLAN / ED COURSE  Pertinent labs & imaging results that were available during my care of the patient were reviewed by me and considered in my medical decision making (see chart for details).   DDX: Colitis, SBO, diverticulitis, dehydration, viral illness, enteritis, gastritis, biliary pathology, mesenteric  ischemia  Jose George is a 50 y.o. who presents to the ED with presentation as described above.  Patient nontoxic-appearing in no acute distress does have some mild abdominal pain.  Mild leukocytosis.  Given his history will order CT imaging.  Patient declining any pain medication at this time.  Will give IV fluids for dehydration.  Clinical Course as of 03/06/21 2022  Wed Mar 06, 2021  1947 CT by my review appears concerning for colitis.  She denies any history of C. difficile.  States he has been having some increasing diarrheal illness.  States he does have a history of nonspecific colitis in the past improved with antibiotics. [PR]  2000 CT showing diffuse colitis also with area suspicious for intussusception.  I have a lower suspicion for mesenteric ischemia he  does not have pain out of proportion on exam only mild leukocytosis.  Denies any history of C. difficile as I said but I consulted with Dr. Allegra Lai of gastroenterology does recommend IV antibiotics to treat for colitis.  Given the extent of colitis.  Concern for possible intussusception will discuss with hospitalist for admission for IV antibiotics IV fluids and further medical work-up.  Discussed case with patient and patient agreeable to plan. [PR]    Clinical Course User Index [PR] Willy Eddy, MD    The patient was evaluated in Emergency Department today for the symptoms described in the history of present illness. He/she was evaluated in the context of the global COVID-19 pandemic, which necessitated consideration that the patient might be at risk for infection with the SARS-CoV-2 virus that causes COVID-19. Institutional protocols and algorithms that pertain to the evaluation of patients at risk for COVID-19 are in a state of rapid change based on information released by regulatory bodies including the CDC and federal and state organizations. These policies and algorithms were followed during the patient's care in the  ED.  As part of my medical decision making, I reviewed the following data within the electronic MEDICAL RECORD NUMBER Nursing notes reviewed and incorporated, Labs reviewed, notes from prior ED visits and [place] Controlled Substance Database   ____________________________________________   FINAL CLINICAL IMPRESSION(S) / ED DIAGNOSES  Final diagnoses:  Colitis      NEW MEDICATIONS STARTED DURING THIS VISIT:  New Prescriptions   No medications on file     Note:  This document was prepared using Dragon voice recognition software and may include unintentional dictation errors.    Willy Eddy, MD 03/06/21 2022

## 2021-03-06 NOTE — H&P (Signed)
Jose George ZOX:[id number] DOB: [birth year] DOA: 03/06/2021   PCP: Harvie HeckKing, Beth A, NP   Outpatient Specialists:     ID: Dr. [facility] Patient arrived to ER on 03/06/21 at 1444 Referred by Attending Willy Eddyobinson, Patrick, MD   Patient coming from: home Lives alone,         Chief Complaint:   Chief Complaint  Patient presents with   Abdominal Pain    HPI: Jose SantiagoRobert George is a 50 y.o. male with medical history significant of HIV and remote diagnosis of crohn's GERD, tobacco abuse    Presented with   abdominal pain for the past 3 days.  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 Allegra LaiVanga recommend IV antibiotics to treat for colitis.  General surgery was contacted as well   ED Triage Vitals  Enc Vitals Group     BP 03/06/21 1450 (!) 142/106     Pulse Rate 03/06/21 1450 (!) 125     Resp 03/06/21 1450 20     Temp 03/06/21 1450 98.5 F (36.9 C)     Temp Source 03/06/21 1450 Oral     SpO2 03/06/21 1450 96 %     Weight 03/06/21 1451 195 lb (88.5 kg)     Height 03/06/21 1451 5\' 11"  (1.803 m)     Head Circumference --      Peak Flow --      Pain  Score 03/06/21 1451 8     Pain Loc --      Pain Edu? --      Excl. in GC? --   TMAX(24)@     _________________________________________ Significant initial  Findings: Abnormal Labs Reviewed  COMPREHENSIVE METABOLIC PANEL - Abnormal; Notable for the following components:      Result Value   Glucose, Bld 117 (*)    Creatinine, Ser 1.40 (*)    All other components within normal limits  CBC - Abnormal; Notable for the following components:   WBC 11.8 (*)    All other components within normal limits  URINALYSIS, COMPLETE (UACMP) WITH MICROSCOPIC - Abnormal; Notable for the following components:   Color, Urine AMBER (*)    APPearance HAZY (*)    Specific Gravity, Urine 1.034 (*)    Ketones, ur 5 (*)    Protein, ur 30 (*)    All other components within normal limits   ____________________________________________ Ordered   CTabd/pelvis - Substantial abnormality of the small bowel including the ileum and potentially some of the distal jejunum,  with wall thickening, accentuated mucosal enhancement, scattered air-fluid levels, and some mildly dilated loops in addition to mild ascites.  segment of intussusception in the jejunum   As noted above, there is a small segment of jejuno-jejunal intussusception, but without proximal bowel dilatation to indicate that this site is obstructive   ECG: Ordered Personally reviewed by me showing: HR : 118 Rhythm:  Sinus tachycardia     no evidence of ischemic changes QTC 412   The recent clinical data is shown below. Vitals:   03/06/21 1450 03/06/21 1451 03/06/21 1747 03/06/21 2000  BP: (!) 142/106  (!) 135/99 (!) 143/90  Pulse: (!) 125  95 76  Resp: 20  17   Temp: 98.5 F (36.9 C)     TempSrc: Oral     SpO2: 96%  96% 97%  Weight:  88.5 kg    Height:  5\' 11"  (1.803 m)      WBC     Component Value Date/Time   WBC 11.8 (H) 03/06/2021 1452   LYMPHSABS 2.7 01/08/2019 1713   MONOABS 0.6 01/08/2019 1713   EOSABS 0.3 01/08/2019 1713    BASOSABS 0.1 01/08/2019 1713     Lactic Acid, Venous    Component Value Date/Time   LATICACIDVEN 1.5 03/06/2021 2001      UA   no evidence of UTI      Urine analysis:    Component Value Date/Time   COLORURINE AMBER (A) 03/06/2021 1754   APPEARANCEUR HAZY (A) 03/06/2021 1754   LABSPEC 1.034 (H) 03/06/2021 1754   PHURINE 5.0 03/06/2021 1754   GLUCOSEU NEGATIVE 03/06/2021 1754   HGBUR NEGATIVE 03/06/2021 1754   BILIRUBINUR NEGATIVE 03/06/2021 1754   KETONESUR 5 (A) 03/06/2021 1754   PROTEINUR 30 (A) 03/06/2021 1754   NITRITE NEGATIVE 03/06/2021 1754   LEUKOCYTESUR NEGATIVE 03/06/2021 1754    Results for orders placed or performed during the hospital encounter of 05/10/18  MRSA PCR Screening     Status: None   Collection Time: 05/11/18  3:27 AM   Specimen: Nasal Mucosa; Nasopharyngeal  Result Value Ref Range Status   MRSA by PCR NEGATIVE NEGATIVE Final    Comment:        The GeneXpert MRSA Assay (FDA approved for NASAL specimens only), is one component of a comprehensive MRSA colonization surveillance program. It is not intended to diagnose MRSA infection nor to guide or monitor treatment for MRSA infections. Performed at [facility], [address] [place], [place]      _______________________________________________________ ER Provider Called:  [id number]    Dr.Vanga They Recommend admit to medicine  Iv antibiotics Will see in AM    _______________________________________________ Hospitalist was called for admission for Colitis  The following Work up has been ordered so far:  Orders Placed This Encounter  Procedures   Gastrointestinal Panel by PCR , Stool   C Difficile Quick Screen w PCR reflex   Resp Panel by RT-PCR (Flu A&B, Covid) Nasopharyngeal Swab   CT ABDOMEN PELVIS W CONTRAST   Lipase, blood   Comprehensive metabolic panel   CBC   Urinalysis, Complete w Microscopic   Lactic acid, plasma   Diet clear liquid Room service appropriate?  Yes; Fluid consistency: Thin   Consult to hospitalist   Airborne and Contact precautions   EKG 12-Lead     Following Medications were ordered in ER: Medications  ciprofloxacin (CIPRO) IVPB 400 mg (has no administration in time range)  metroNIDAZOLE (FLAGYL) IVPB 500 mg (500  mg Intravenous New Bag/Given 03/06/21 2018)  iohexol (OMNIPAQUE) 350 MG/ML injection 100 mL (100 mLs Intravenous Contrast Given 03/06/21 1846)  sodium chloride 0.9 % bolus 1,000 mL (1,000 mLs Intravenous New Bag/Given 03/06/21 1923)        Consult Orders  (From admission, onward)           Start     Ordered   03/06/21 2013  Consult to hospitalist  Once       Provider:  (Not yet assigned)  Question Answer Comment  Place call to: [phone]   Reason for Consult Admit      03/06/21 2012             OTHER Significant initial  Findings:  labs showing:    Recent Labs  Lab 03/06/21 1452  NA 137  K 4.4  CO2 25  GLUCOSE 117*  BUN 12  CREATININE 1.40*  CALCIUM 9.5    Cr    Up from baseline see below Lab Results  Component Value Date   CREATININE 1.40 (H) 03/06/2021   CREATININE 1.34 (H) 05/08/2019   CREATININE 1.27 (H) 01/08/2019    Recent Labs  Lab 03/06/21 1452  AST 19  ALT 18  ALKPHOS 65  BILITOT 0.8  PROT 8.1  ALBUMIN 4.3   Lab Results  Component Value Date   CALCIUM 9.5 03/06/2021    Plt: Lab Results  Component Value Date   PLT 201 03/06/2021      Recent Labs  Lab 03/06/21 1452  WBC 11.8*  HGB 17.0  HCT 47.2  MCV 93.3  PLT 201    HG/HCT  stable,       Component Value Date/Time   HGB 17.0 03/06/2021 1452   HCT 47.2 03/06/2021 1452   MCV 93.3 03/06/2021 1452    Recent Labs  Lab 03/06/21 1452  LIPASE 29    Cultures: No results found for: SDES, SPECREQUEST, CULT, REPTSTATUS   Radiological Exams on Admission: CT ABDOMEN PELVIS W CONTRAST  Result Date: 03/06/2021 CLINICAL DATA:  Abdominal pain starting on Monday. EXAM: CT ABDOMEN AND PELVIS WITH  CONTRAST TECHNIQUE: Multidetector CT imaging of the abdomen and pelvis was performed using the standard protocol following bolus administration of intravenous contrast. CONTRAST:  OMNIPAQUE IOHEXOL 350 MG/ML SOLN COMPARISON:  Overlapping portions of CT chest 05/08/2019 FINDINGS: Lower chest: Mild dependent subsegmental atelectasis in both lower lobes. Hepatobiliary: The liver and gallbladder appear unremarkable. No biliary dilatation. 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 Otherwise unremarkable. Stomach/Bowel: Left upper quadrant jejunal jejunal intussusception with involve segment about 5.2 cm in length. There is substantial left abnormal bowel wall thickening and accentuated mucosal enhancement throughout the ileum and potentially involving portions of the distal jejunum. Fluid-filled cecum although without the degree of wall thickening in the cecum as that encountered in the distal small bowel. Some of the fluid-filled distal small bowel loops are mildly dilated measuring up to about 3.2 cm in diameter. The appendix appears normal. Small bowel loops demonstrate a targetoid appearance with enhancement of the mucosa and hypodense submucosa. Vascular/Lymphatic: There is some mild distal abdominal aortic atherosclerotic calcification extending into the iliac arteries but no significant atheromatous plaque involvement or narrowing in the celiac trunk or SMA. The IMA appears patent. The portal vein and inferior mesenteric vein appear unremarkable. Reproductive: Unremarkable Other: Mild scattered ascites most notable along the hepatic margin, pelvis, and right lower quadrant mesentery. Musculoskeletal: Unremarkable IMPRESSION:  1. Substantial abnormality of the small bowel including the ileum and potentially some of the distal jejunum, with wall thickening, accentuated mucosal enhancement, scattered air-fluid  levels, and some mildly dilated loops in addition to mild ascites. In addition, there is a segment of intussusception in the jejunum which is well proximal to this region of involvement, and not associated with proximal small bowel dilatation. Possibilities given this appearance could include ischemia, inflammatory bowel disease, angioedema, or an unusual distal enteritis. Although there is some minor atherosclerosis, the celiac trunk, SMA and its branches, and IMA appear widely patent, and I do not see any thrombus in the inferior mesenteric vein or its tributaries to further indicate a venous cause. Vasospasm or vasculitis could potentially cause this appearance but I do not see irregularity of the mesenteric vessels. Crohn's disease is possible but the long region of involvement including almost the entire ileum would be somewhat unusual. Small bowel angioedema can sometimes cause a similar appearance there is no pneumatosis or portal venous gas. I do not see any swirling of the mesentery to indicate a clip is loop obstruction and the wall thickening extends all the way to the terminal ileum and ileocecal valve, which would be highly unusual for a closed loop obstruction. There is fluid contents in the cecum with a normal appearance of the appendix but no overt colon wall thickening. Correlate with patient's symptoms in differentiating between infectious etiologies, vaso spasm/vasculitis, and inflammatory bowel disease. 2. As noted above, there is a small segment of jejuno-jejunal intussusception, but without proximal bowel dilatation to indicate that this site is obstructive. In adults, sites of of small bowel intussusception often have an underlying lesion. Electronically Signed   By: Gaylyn Rong M.D.   On: 03/06/2021 19:47   _______________________________________________________________________________________________________ Latest  Blood pressure (!) 143/90, pulse 76, temperature 98.5 F (36.9 C),  temperature source Oral, resp. rate 17, height 5\' 11"  (1.803 m), weight 88.5 kg, SpO2 97 %.   Review of Systems:    Pertinent positives include:  fatigue, abdominal pain Light stools Constitutional:  No weight loss, night sweats, Fevers, chills, , weight loss  HEENT:  No headaches, Difficulty swallowing,Tooth/dental problems,Sore throat,  No sneezing, itching, ear ache, nasal congestion, post nasal drip,  Cardio-vascular:  No chest pain, Orthopnea, PND, anasarca, dizziness, palpitations.no Bilateral lower extremity swelling  GI:  No heartburn, indigestion, nausea, vomiting, diarrhea, change in bowel habits, loss of appetite, melena, blood in stool, hematemesis Resp:  no shortness of breath at rest. No dyspnea on exertion, No excess mucus, no productive cough, No non-productive cough, No coughing up of blood.No change in color of mucus.No wheezing. Skin:  no rash or lesions. No jaundice GU:  no dysuria, change in color of urine, no urgency or frequency. No straining to urinate.  No flank pain.  Musculoskeletal:  No joint pain or no joint swelling. No decreased range of motion. No back pain.  Psych:  No change in mood or affect. No depression or anxiety. No memory loss.  Neuro: no localizing neurological complaints, no tingling, no weakness, no double vision, no gait abnormality, no slurred speech, no confusion  All systems reviewed and apart from HOPI all are negative _______________________________________________________________________________________________ Past Medical History:   Past Medical History:  Diagnosis Date   HIV (human immunodeficiency virus infection) (HCC)    Palpitations       No past surgical history on file.  Social History:  Ambulatory   independently       reports that he  has been smoking cigarettes. He has been smoking an average of .1 packs per day. He has never used smokeless tobacco. He reports that he does not drink alcohol and does not use  drugs.    Family History:  Family History  Problem Relation Age of Onset   CAD Mother    ______________________________________________________________________________________________ Allergies: No Known Allergies   Prior to Admission medications   Medication Sig Start Date End Date Taking? Authorizing Provider  bictegravir-emtricitabine-tenofovir AF (BIKTARVY) 50-200-25 MG TABS tablet Take by mouth. 11/30/18   [provider]    ___________________________________________________________________________________________________ Physical Exam: Vitals with BMI 03/06/2021 03/06/2021 03/06/2021  Height - - 5\' 11"   Weight - - 195 lbs  BMI - - 27.21  Systolic 143 135 -  Diastolic 90 99 -  Pulse 76 95 -     1. General:  in No  Acute distress   Chronically ill *well *cachectic *toxic acutely ill -appearing 2. Psychological: Alert and  Oriented 3. Head/ENT:   Dry Mucous Membranes                          Head Non traumatic, neck supple                          Normal  Dentition 4. SKIN:  decreased Skin turgor,  Skin clean Dry and intact no rash 5. Heart: Regular rate and rhythm no  Murmur, no Rub or gallop 6. Lungs:  Clear to auscultation bilaterally, no wheezes or crackles   7. Abdomen: Soft,  tender, Non distended    bowel sounds present 8. Lower extremities: no clubbing, cyanosis, no  edema 9. Neurologically Grossly intact, moving all 4 extremities equally   10. MSK: Normal range of motion    Chart has been reviewed  ______________________________________________________________________________________________  Assessment/Plan   50 y.o. male with medical history significant of HIV and remote diagnosis of "colitis", GERD, tobacco abuse   Admitted for colits  Present on Admission:  Colitis - continue cipro/flagyl ask pahrmacy to renal adjust IV fluids, clear diet appreciate GI consult As per surgery no operative intervention indicated    Tobacco use disorder -   - Spoke about importance of quitting spent 5 minutes discussing options for treatment, prior attempts at quitting, and dangers of smoking  -At this point patient is   NOT  interested in quitting  - refused nicotine patch   - nursing tobacco cessation protocol   HIV (human immunodeficiency virus infection) (HCC) - order HIV viral load and CD4  CKD stage II -  -chronic avoid nephrotoxic medications such as NSAIDs, Vanco Zosyn combo,  avoid hypotension, continue to follow renal function    Other plan as per orders.  DVT prophylaxis:  SCD      Code Status:    Code Status: Prior FULL CODE  as per patient   I had personally discussed CODE STATUS with patient     Family Communication:   Family not at  Bedside    Disposition Plan:     To home once workup is complete and patient is stable   Following barriers for discharge:  Pain controlled with PO medications                                white count improving able to transition to PO antibiotics                             Will need to be able to tolerate PO                                                        Will need consultants to evaluate patient prior to discharge                              Consults called:  GI is aware  general surgery is aware felt no surgical indication and signed off  Admission status:  ED Disposition     ED Disposition  Admit   Condition  --   Comment  Hospital Area: [facility] [[id number]]  Level of Care: Med-Surg [16]  Covid Evaluation: Asymptomatic Screening Protocol (No Symptoms)  Diagnosis: Colitis [[id number]]  Admitting Physician: Therisa Doyne [[id number]]  Attending Physician: Therisa Doyne [[id number]]  Estimated length of stay: past midnight tomorrow  Certification:: I certify this patient will need inpatient services for at least 2 midnights             inpatient     I Expect 2 midnight stay secondary  to severity of patient's current illness need for inpatient interventions justified by the following:  hemodynamic instability despite optimal treatment (tachycardia  )    Severe lab/radiological/exam abnormalities including:    Colitis and extensive comorbidities including: HIV  That are currently affecting medical management.   I expect  patient to be hospitalized for 2 midnights requiring inpatient medical care.  Patient is at high risk for adverse outcome (such as loss of life or disability) if not treated.  Indication for inpatient stay as follows:    severe pain requiring acute inpatient management,  inability to maintain oral hydration    Need for operative/procedural  intervention     Need for IV antibiotics, IV fluids, IV pain medications,      Level of care     tele  For  24H       Lab Results  Component Value Date   SARSCOV2NAA NEGATIVE 03/06/2021     Precautions: admitted as  Covid Negative     PPE: Used by the provider:   N95  eye Goggles,  Gloves     Jose George 03/06/2021, 10:51 PM    [facility]     after 2 AM please page floor coverage PA If 7AM-7PM, please contact the day team taking care of the patient using [email or web address]   Patient was evaluated in the context of the global COVID-19 pandemic, which necessitated consideration that the patient might be at risk for infection with the SARS-CoV-2 virus that causes COVID-19. Institutional protocols and algorithms that pertain to the evaluation of patients at risk for COVID-19 are in a state of rapid change based on information released by regulatory bodies including the CDC and federal and state organizations. These policies and algorithms were followed during  the patient's care.

## 2021-03-06 NOTE — Consult Note (Signed)
[facility] SURGICAL CONSULTATION NOTE (initial) - cpt: 54008   HISTORY OF PRESENT ILLNESS (HPI):  50 y.o. male presented to [facility] ED today for evaluation of 3 days of gradually progressive abdominal pain. Patient reports "blood on stools" loose, apparently light.  Denies any history of hematochezia, melena or cramping pain.  Initially his pain started the day following a large meal at [place] [employer].  Is gradually progressed, seemingly worsened by position, not crampy or recurring.  Reports a history of Crohn's disease diagnosed 20 years ago but has not been under any form of treatment since.  Denies bilious emesis, or hematemesis.  Denies fevers and chills.  Surgery is consulted by ED physician Dr. Roxan Hockey in this context for evaluation for report of possible intussusception on CT scan.  PAST MEDICAL HISTORY (PMH):  Past Medical History:  Diagnosis Date   HIV (human immunodeficiency virus infection) (HCC)    Palpitations      PAST SURGICAL HISTORY (PSH):  No past surgical history on file.   MEDICATIONS:  Prior to Admission medications   Medication Sig Start Date End Date Taking? Authorizing Provider  bictegravir-emtricitabine-tenofovir AF (BIKTARVY) 50-200-25 MG TABS tablet Take by mouth. 11/30/18  Yes [provider]     ALLERGIES:  No Known Allergies   SOCIAL HISTORY:  Social History   Socioeconomic History   Marital status: Single    Spouse name: Not on file   Number of children: Not on file   Years of education: Not on file   Highest education level: Not on file  Occupational History   Not on file  Tobacco Use   Smoking status: Every Day    Packs/day: 0.10    Types: Cigarettes   Smokeless tobacco: Never  Vaping Use   Vaping Use: Never used  Substance and Sexual Activity   Alcohol use: No   Drug use: Never   Sexual activity: Yes    Partners: Male    Birth control/protection: Condom  Other Topics Concern   Not on file  Social History  Narrative   Not on file   Social Determinants of Health   Financial Resource Strain: Not on file  Food Insecurity: Not on file  Transportation Needs: Not on file  Physical Activity: Not on file  Stress: Not on file  Social Connections: Not on file  Intimate Partner Violence: Not on file     FAMILY HISTORY:  Family History  Problem Relation Age of Onset   CAD Mother       REVIEW OF SYSTEMS:  ROS  VITAL SIGNS:  Temp:  [98.5 F (36.9 C)] 98.5 F (36.9 C) (08/31 1450) Pulse Rate:  [76-125] 76 (08/31 2000) Resp:  [17-20] 17 (08/31 1747) BP: (135-143)/(90-106) 143/90 (08/31 2000) SpO2:  [96 %-97 %] 97 % (08/31 2000) Weight:  [88.5 kg] 88.5 kg (08/31 1451)     Height: 5\' 11"  (180.3 cm) Weight: 88.5 kg BMI (Calculated): 27.21   INTAKE/OUTPUT:  No intake/output data recorded.  PHYSICAL EXAM:  Physical Exam Blood pressure (!) 143/90, pulse 76, temperature 98.5 F (36.9 C), temperature source Oral, resp. rate 17, height 5\' 11"  (1.803 m), weight 88.5 kg, SpO2 97 %. Last Weight  Most recent update: 03/06/2021  2:51 PM    Weight  88.5 kg (195 lb)             CONSTITUTIONAL: Well developed, and nourished, appropriately responsive and aware without distress.  Nontoxic. EYES: Sclera non-icteric.   EARS, NOSE, MOUTH AND THROAT:  Mask worn. The oropharynx is clear. Oral mucosa is pink and moist.    Hearing is intact to voice.  NECK: Trachea is midline, and there is no jugular venous distension.  LYMPH NODES:  Lymph nodes in the neck are not enlarged. RESPIRATORY:  Lungs are clear, and breath sounds are equal bilaterally. Normal respiratory effort without pathologic use of accessory muscles. CARDIOVASCULAR: Heart is regular in rate and rhythm. GI: The abdomen is soft, nontender, and nondistended. There were no palpable masses. I did not appreciate hepatosplenomegaly. There were normal bowel sounds. MUSCULOSKELETAL:  Symmetrical muscle tone appreciated in all four extremities.     SKIN: Skin turgor is normal. No pathologic skin lesions appreciated.  NEUROLOGIC:  Motor and sensation appear grossly normal.  Cranial nerves are grossly without defect. PSYCH:  Alert and oriented to person, place and time. Affect is appropriate for situation.  Data Reviewed I have personally reviewed what is currently available of the patient's imaging, recent labs and medical records.    Labs:  CBC Latest Ref Rng & Units 03/06/2021 05/08/2019 01/08/2019  WBC 4.0 - 10.5 K/uL 11.8(H) 11.5(H) 7.4  Hemoglobin 13.0 - 17.0 g/dL 81.4 48.1 85.6  Hematocrit 39.0 - 52.0 % 47.2 43.7 40.5  Platelets 150 - 400 K/uL 201 213 192   CMP Latest Ref Rng & Units 03/06/2021 05/08/2019 01/08/2019  Glucose 70 - 99 mg/dL 314(H) 702(O) 378(H)  BUN 6 - 20 mg/dL 12 12 12   Creatinine 0.61 - 1.24 mg/dL ) 8.85(O) 2.77(A)  Sodium 135 - 145 mmol/L 137 139 138  Potassium 3.5 - 5.1 mmol/L 4.4 3.2(L) 4.1  Chloride 98 - 111 mmol/L 102 104 105  CO2 22 - 32 mmol/L 25 25 23   Calcium 8.9 - 10.3 mg/dL 9.5 9.1 9.5  Total Protein 6.5 - 8.1 g/dL 8.1 - -  Total Bilirubin 0.3 - 1.2 mg/dL 0.8 - -  Alkaline Phos 38 - 126 U/L 65 - -  AST 15 - 41 U/L 19 - -  ALT 0 - 44 U/L 18 - -     Imaging studies:   Last 24 hrs: CT ABDOMEN PELVIS W CONTRAST  Result Date: 03/06/2021 CLINICAL DATA:  Abdominal pain starting on Monday. EXAM: CT ABDOMEN AND PELVIS WITH CONTRAST TECHNIQUE: Multidetector CT imaging of the abdomen and pelvis was performed using the standard protocol following bolus administration of intravenous contrast. CONTRAST:  03/08/2021 OMNIPAQUE IOHEXOL 350 MG/ML SOLN COMPARISON:  Overlapping portions of CT chest 05/08/2019 FINDINGS: Lower chest: Mild dependent subsegmental atelectasis in both lower lobes. Hepatobiliary: The liver and gallbladder appear unremarkable. No biliary dilatation. 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  Otherwise unremarkable. Stomach/Bowel: Left upper quadrant jejunal jejunal intussusception with involve segment about 5.2 cm in length. There is substantial left abnormal bowel wall thickening and accentuated mucosal enhancement throughout the ileum and potentially involving portions of the distal jejunum. Fluid-filled cecum although without the degree of wall thickening in the cecum as that encountered in the distal small bowel. Some of the fluid-filled distal small bowel loops are mildly dilated measuring up to about 3.2 cm in diameter. The appendix appears normal. Small bowel loops demonstrate a targetoid appearance with enhancement of the mucosa and hypodense submucosa. Vascular/Lymphatic: There is some mild distal abdominal aortic atherosclerotic calcification extending into the iliac arteries but no significant atheromatous plaque involvement or narrowing in the celiac trunk or SMA. The IMA appears patent. The portal vein and inferior mesenteric  vein appear unremarkable. Reproductive: Unremarkable Other: Mild scattered ascites most notable along the hepatic margin, pelvis, and right lower quadrant mesentery. Musculoskeletal: Unremarkable IMPRESSION: 1. Substantial abnormality of the small bowel including the ileum and potentially some of the distal jejunum, with wall thickening, accentuated mucosal enhancement, scattered air-fluid levels, and some mildly dilated loops in addition to mild ascites. In addition, there is a segment of intussusception in the jejunum which is well proximal to this region of involvement, and not associated with proximal small bowel dilatation. Possibilities given this appearance could include ischemia, inflammatory bowel disease, angioedema, or an unusual distal enteritis. Although there is some minor atherosclerosis, the celiac trunk, SMA and its branches, and IMA appear widely patent, and I do not see any thrombus in the inferior mesenteric vein or its tributaries to further  indicate a venous cause. Vasospasm or vasculitis could potentially cause this appearance but I do not see irregularity of the mesenteric vessels. Crohn's disease is possible but the long region of involvement including almost the entire ileum would be somewhat unusual. Small bowel angioedema can sometimes cause a similar appearance there is no pneumatosis or portal venous gas. I do not see any swirling of the mesentery to indicate a clip is loop obstruction and the wall thickening extends all the way to the terminal ileum and ileocecal valve, which would be highly unusual for a closed loop obstruction. There is fluid contents in the cecum with a normal appearance of the appendix but no overt colon wall thickening. Correlate with patient's symptoms in differentiating between infectious etiologies, vaso spasm/vasculitis, and inflammatory bowel disease. 2. As noted above, there is a small segment of jejuno-jejunal intussusception, but without proximal bowel dilatation to indicate that this site is obstructive. In adults, sites of of small bowel intussusception often have an underlying lesion. Electronically Signed   By: Gaylyn Rong M.D.   On: 03/06/2021 19:47     Assessment/Plan:  50 y.o. male with likely colitis and other CT GI related abnormalities, complicated by pertinent comorbidities including.  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  -As far as the extensive differential diagnosis as noted on the CT report, none of this appears consistent with his clinical presentation nor his examination.  -No imminent surgical intervention anticipated.  -Surgery will sign off,  please feel free to reconsult as needed.    Thank you for the opportunity to participate in this patient's care.   -- Campbell Lerner, M.D., FACS 03/06/2021, 10:28 PM

## 2021-03-06 NOTE — ED Notes (Signed)
Patient transported to CT 

## 2021-03-07 ENCOUNTER — Other Ambulatory Visit: Payer: Self-pay

## 2021-03-07 DIAGNOSIS — B20 Human immunodeficiency virus [HIV] disease: Secondary | ICD-10-CM

## 2021-03-07 DIAGNOSIS — K529 Noninfective gastroenteritis and colitis, unspecified: Principal | ICD-10-CM

## 2021-03-07 DIAGNOSIS — K50919 Crohn's disease, unspecified, with unspecified complications: Secondary | ICD-10-CM

## 2021-03-07 LAB — COMPREHENSIVE METABOLIC PANEL
ALT: 14 U/L (ref 0–44)
AST: 15 U/L (ref 15–41)
Albumin: 3.8 g/dL (ref 3.5–5.0)
Alkaline Phosphatase: 51 U/L (ref 38–126)
Anion gap: 7 (ref 5–15)
BUN: 10 mg/dL (ref 6–20)
CO2: 26 mmol/L (ref 22–32)
Calcium: 8.5 mg/dL — ABNORMAL LOW (ref 8.9–10.3)
Chloride: 103 mmol/L (ref 98–111)
Creatinine, Ser: 1.17 mg/dL (ref 0.61–1.24)
GFR, Estimated: >60 mL/min (ref >60)
Glucose, Bld: 99 mg/dL (ref 70–99)
Potassium: 3.5 mmol/L (ref 3.5–5.1)
Sodium: 136 mmol/L (ref 135–145)
Total Bilirubin: 0.7 mg/dL (ref 0.3–1.2)
Total Protein: 6.8 g/dL (ref 6.5–8.1)

## 2021-03-07 LAB — CBC WITH DIFFERENTIAL/PLATELET
Abs Immature Granulocytes: 0.02 10*3/uL (ref 0.00–0.07)
Basophils Absolute: 0 10*3/uL (ref 0.0–0.1)
Basophils Relative: 1 %
Eosinophils Absolute: 0.2 10*3/uL (ref 0.0–0.5)
Eosinophils Relative: 2 %
HCT: 40.9 % (ref 39.0–52.0)
Hemoglobin: 14.3 g/dL (ref 13.0–17.0)
Immature Granulocytes: 0 %
Lymphocytes Relative: 27 %
Lymphs Abs: 2 10*3/uL (ref 0.7–4.0)
MCH: 33.3 pg (ref 26.0–34.0)
MCHC: 35 g/dL (ref 30.0–36.0)
MCV: 95.1 fL (ref 80.0–100.0)
Monocytes Absolute: 0.7 10*3/uL (ref 0.1–1.0)
Monocytes Relative: 10 %
Neutro Abs: 4.5 10*3/uL (ref 1.7–7.7)
Neutrophils Relative %: 60 %
Platelets: 142 10*3/uL — ABNORMAL LOW (ref 150–400)
RBC: 4.3 MIL/uL (ref 4.22–5.81)
RDW: 12.5 % (ref 11.5–15.5)
WBC: 7.5 10*3/uL (ref 4.0–10.5)
nRBC: 0 % (ref 0.0–0.2)

## 2021-03-07 LAB — TSH: TSH: 2.432 u[IU]/mL (ref 0.350–4.500)

## 2021-03-07 LAB — PHOSPHORUS: Phosphorus: 3.2 mg/dL (ref 2.5–4.6)

## 2021-03-07 LAB — MAGNESIUM: Magnesium: 2 mg/dL (ref 1.7–2.4)

## 2021-03-07 MED ORDER — CIPROFLOXACIN HCL 500 MG PO TABS: 500.0000 mg | ORAL_TABLET | Freq: Two times a day (BID) | ORAL | 0 refills | Status: AC

## 2021-03-07 MED ORDER — METRONIDAZOLE 500 MG PO TABS: 500.0000 mg | ORAL_TABLET | Freq: Three times a day (TID) | ORAL | 0 refills | Status: AC

## 2021-03-07 MED ORDER — GOLYTELY 236 G PO SOLR: 4000.0000 mL | Freq: Once | ORAL | 0 refills | Status: AC

## 2021-03-07 NOTE — ED Notes (Signed)
Pt given cup of gingerale, pt ambulatory to bathroom at this time.

## 2021-03-07 NOTE — Discharge Summary (Signed)
Discharge Summary  Jose George GOT:[id number] DOB: [birth year]  PCP: Harvie Heck, NP  Admit date: 03/06/2021 Discharge date: 03/07/2021  Time spent:  Recommendations for Outpatient Follow-up:  F/u with PCP within a week  for hospital discharge follow up, repeat cbc/bmp at follow up F/u  with GI, colonoscopy on 9/13    Discharge Diagnoses:  Active Hospital Problems   Diagnosis Date Noted   Colitis 03/06/2021   Tobacco use disorder 12/25/2017   HIV (human immunodeficiency virus infection) (HCC) 03/23/2013    Resolved Hospital Problems  No resolved problems to display.    Discharge Condition: stable  Diet recommendation: bland diet  Filed Weights   03/06/21 1451  Weight: 88.5 kg    History of present illness: ( per admitting MD Dr Adela Glimpse) Jose George is a 50 y.o. male with medical history significant of HIV and remote diagnosis of crohn's GERD, tobacco abuse     Presented with   abdominal pain for the past 3 days.  No lower abdomen no associated nausea vomiting or diarrhea Also noted that his stools are "blonde colored"   Reports in 2002 he was told he was told he had crohns but n colonoscopy was done It was in a different state he was treated with antibiotics and it did help and have not bothered him since  No family  history of IBD  Plant to quit smoking when he tun 51   Has   been vaccinated against COVID  had a bad reaction    Initial COVID TEST  NEGATIVE   Hospital Course:  Active Problems:   HIV (human immunodeficiency virus infection) (HCC)   Tobacco use disorder   Colitis  Colitis Received IV Cipro and Flagyl and IV hydration Seen by GI Dr. Allegra Lai who cleared him to discharge home with oral Cipro and Flagyl Outpatient follow-up with her for colonoscopy Patient report feeling better, able to tolerate diet, no vomiting, he desires to go home, he is advised to close follow-up with PCP and GI.  HIV report stable Continue home  medication Report follow-up with infectious disease regularly  Tobacco use disorder Does not appear ready to quit currently    Consultations: GI  Discharge Exam: BP (!) 145/92 (BP Location: Left Arm)   Pulse 70   Temp 98.4 F (36.9 C) (Oral)   Resp 16   Ht 5\' 11"  (1.803 m)   Wt 88.5 kg   SpO2 98%   BMI 27.20 kg/m   General: NAD Cardiovascular: RRR Respiratory: Normal respiratory effort  Discharge Instructions You were cared for by a hospitalist during your hospital stay. If you have any questions about your discharge medications or the care you received while you were in the hospital after you are discharged, you can call the unit and asked to speak with the hospitalist on call if the hospitalist that took care of you is not available. Once you are discharged, your primary care physician will handle any further medical issues. Please note that NO REFILLS for any discharge medications will be authorized once you are discharged, as it is imperative that you return to your primary care physician (or establish a relationship with a primary care physician if you do not have one) for your aftercare needs so that they can reassess your need for medications and monitor your lab values.  Discharge Instructions     Diet general   Complete by: As directed    Start full liquid diet, advance as tolerated to bland diet  Increase activity slowly   Complete by: As directed       Allergies as of 03/07/2021   No Known Allergies      Medication List     TAKE these medications    bictegravir-emtricitabine-tenofovir AF 50-200-25 MG Tabs tablet Commonly known as: BIKTARVY Take by mouth.   ciprofloxacin 500 MG tablet Commonly known as: Cipro Take 1 tablet (500 mg total) by mouth 2 (two) times daily for 3 days.   Golytely 236 g solution Generic drug: polyethylene glycol Take 4,000 mLs by mouth once for 1 dose.   metroNIDAZOLE 500 MG tablet Commonly known as: Flagyl Take 1  tablet (500 mg total) by mouth 3 (three) times daily for 3 days.       No Known Allergies  Follow-up Information     Pamella Pert A, NP Follow up in 1 week(s).   Specialty: Family Medicine Why: hospital discharge follow up, Contact information: [address] [place] [address] [phone]         Toney Reil, MD Follow up.   Specialty: Gastroenterology Why: colonoscopy on 9/13 Contact information: [address] [place] [address] [phone]                  The results of significant diagnostics from this hospitalization (including imaging, microbiology, ancillary and laboratory) are listed below for reference.    Significant Diagnostic Studies: CT ABDOMEN PELVIS W CONTRAST  Result Date: 03/06/2021 CLINICAL DATA:  Abdominal pain starting on Monday. EXAM: CT ABDOMEN AND PELVIS WITH CONTRAST TECHNIQUE: Multidetector CT imaging of the abdomen and pelvis was performed using the standard protocol following bolus administration of intravenous contrast. CONTRAST:  OMNIPAQUE IOHEXOL 350 MG/ML SOLN COMPARISON:  Overlapping portions of CT chest 05/08/2019 FINDINGS: Lower chest: Mild dependent subsegmental atelectasis in both lower lobes. Hepatobiliary: The liver and gallbladder appear unremarkable. No biliary dilatation. 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 Otherwise unremarkable. Stomach/Bowel: Left upper quadrant jejunal jejunal intussusception with involve segment about 5.2 cm in length. There is substantial left abnormal bowel wall thickening and accentuated mucosal enhancement throughout the ileum and potentially involving portions of the distal jejunum. Fluid-filled cecum although without the degree of wall thickening in the cecum as that encountered in the distal small bowel. Some of the fluid-filled distal small bowel loops are mildly dilated  measuring up to about 3.2 cm in diameter. The appendix appears normal. Small bowel loops demonstrate a targetoid appearance with enhancement of the mucosa and hypodense submucosa. Vascular/Lymphatic: There is some mild distal abdominal aortic atherosclerotic calcification extending into the iliac arteries but no significant atheromatous plaque involvement or narrowing in the celiac trunk or SMA. The IMA appears patent. The portal vein and inferior mesenteric vein appear unremarkable. Reproductive: Unremarkable Other: Mild scattered ascites most notable along the hepatic margin, pelvis, and right lower quadrant mesentery. Musculoskeletal: Unremarkable IMPRESSION: 1. Substantial abnormality of the small bowel including the ileum and potentially some of the distal jejunum, with wall thickening, accentuated mucosal enhancement, scattered air-fluid levels, and some mildly dilated loops in addition to mild ascites. In addition, there is a segment of intussusception in the jejunum which is well proximal to this region of involvement, and not associated with proximal small bowel dilatation. Possibilities given this appearance could include ischemia, inflammatory bowel disease, angioedema, or an unusual distal enteritis. Although there is some minor atherosclerosis, the celiac trunk, SMA and its branches, and IMA  appear widely patent, and I do not see any thrombus in the inferior mesenteric vein or its tributaries to further indicate a venous cause. Vasospasm or vasculitis could potentially cause this appearance but I do not see irregularity of the mesenteric vessels. Crohn's disease is possible but the long region of involvement including almost the entire ileum would be somewhat unusual. Small bowel angioedema can sometimes cause a similar appearance there is no pneumatosis or portal venous gas. I do not see any swirling of the mesentery to indicate a clip is loop obstruction and the wall thickening extends all the way to  the terminal ileum and ileocecal valve, which would be highly unusual for a closed loop obstruction. There is fluid contents in the cecum with a normal appearance of the appendix but no overt colon wall thickening. Correlate with patient's symptoms in differentiating between infectious etiologies, vaso spasm/vasculitis, and inflammatory bowel disease. 2. As noted above, there is a small segment of jejuno-jejunal intussusception, but without proximal bowel dilatation to indicate that this site is obstructive. In adults, sites of of small bowel intussusception often have an underlying lesion. Electronically Signed   By: Gaylyn Rong M.D.   On: 03/06/2021 19:47    Microbiology: Recent Results (from the past 240 hour(s))  Gastrointestinal Panel by PCR , Stool     Status: None   Collection Time: 03/06/21  8:22 PM   Specimen: Stool  Result Value Ref Range Status   Campylobacter species NOT DETECTED NOT DETECTED Final   Plesimonas shigelloides NOT DETECTED NOT DETECTED Final   Salmonella species NOT DETECTED NOT DETECTED Final   Yersinia enterocolitica NOT DETECTED NOT DETECTED Final   Vibrio species NOT DETECTED NOT DETECTED Final   Vibrio cholerae NOT DETECTED NOT DETECTED Final   Enteroaggregative E coli (EAEC) NOT DETECTED NOT DETECTED Final   Enteropathogenic E coli (EPEC) NOT DETECTED NOT DETECTED Final   Enterotoxigenic E coli (ETEC) NOT DETECTED NOT DETECTED Final   Shiga like toxin producing E coli (STEC) NOT DETECTED NOT DETECTED Final   Shigella/Enteroinvasive E coli (EIEC) NOT DETECTED NOT DETECTED Final   Cryptosporidium NOT DETECTED NOT DETECTED Final   Cyclospora cayetanensis NOT DETECTED NOT DETECTED Final   Entamoeba histolytica NOT DETECTED NOT DETECTED Final   Giardia lamblia NOT DETECTED NOT DETECTED Final   Adenovirus F40/41 NOT DETECTED NOT DETECTED Final   Astrovirus NOT DETECTED NOT DETECTED Final   Norovirus GI/GII NOT DETECTED NOT DETECTED Final   Rotavirus A NOT  DETECTED NOT DETECTED Final   Sapovirus (I, II, IV, and V) NOT DETECTED NOT DETECTED Final    Comment: Performed at [facility], [address] [place], [place] [address]  C Difficile Quick Screen w PCR reflex     Status: None   Collection Time: 03/06/21  8:22 PM   Specimen: Stool  Result Value Ref Range Status   C Diff antigen NEGATIVE NEGATIVE Final   C Diff toxin NEGATIVE NEGATIVE Final   C Diff interpretation No C. difficile detected.  Final    Comment: Performed at [facility], [address] [place], [place] [address]  Resp Panel by RT-PCR (Flu A&B, Covid)     Status: None   Collection Time: 03/06/21  8:22 PM   Specimen: Nasopharyngeal(NP) swabs in vial transport medium  Result Value Ref Range Status   SARS Coronavirus 2 by RT PCR NEGATIVE NEGATIVE Final    Comment: (NOTE) SARS-CoV-2 target nucleic acids are NOT DETECTED.  The SARS-CoV-2 RNA is  generally detectable in upper respiratory specimens during the acute phase of infection. The lowest concentration of SARS-CoV-2 viral copies this assay can detect is 138 copies/mL. A negative result does not preclude SARS-Cov-2 infection and should not be used as the sole basis for treatment or other patient management decisions. A negative result may occur with  improper specimen collection/handling, submission of specimen other than nasopharyngeal swab, presence of viral mutation(s) within the areas targeted by this assay, and inadequate number of viral copies(<138 copies/mL). A negative result must be combined with clinical observations, patient history, and epidemiological information. The expected result is Negative.  Fact Sheet for Patients:  BloggerCourse.comhttps://www.fda.gov/media/152166/download  Fact Sheet for Healthcare Providers:  SeriousBroker.ithttps://www.fda.gov/media/152162/download  This test is no t yet approved or cleared by the [place] FDA and  has been authorized for detection and/or diagnosis of SARS-CoV-2  by FDA under an Emergency Use Authorization (EUA). This EUA will remain  in effect (meaning this test can be used) for the duration of the COVID-19 declaration under Section 564(b)(1) of the Act, 21 U.S.C.section 360bbb-3(b)(1), unless the authorization is terminated  or revoked sooner.       Influenza A by PCR NEGATIVE NEGATIVE Final   Influenza B by PCR NEGATIVE NEGATIVE Final    Comment: (NOTE) The Xpert Xpress SARS-CoV-2/FLU/RSV plus assay is intended as an aid in the diagnosis of influenza from Nasopharyngeal swab specimens and should not be used as a sole basis for treatment. Nasal washings and aspirates are unacceptable for Xpert Xpress SARS-CoV-2/FLU/RSV testing.  Fact Sheet for Patients: BloggerCourse.comhttps://www.fda.gov/media/152166/download  Fact Sheet for Healthcare Providers: SeriousBroker.ithttps://www.fda.gov/media/152162/download  This test is not yet approved or cleared by the [place] FDA and has been authorized for detection and/or diagnosis of SARS-CoV-2 by FDA under an Emergency Use Authorization (EUA). This EUA will remain in effect (meaning this test can be used) for the duration of the COVID-19 declaration under Section 564(b)(1) of the Act, 21 U.S.C. section 360bbb-3(b)(1), unless the authorization is terminated or revoked.  Performed at [facility], [address] [place], [place] [id number]      Labs: Basic Metabolic Panel: Recent Labs  Lab 03/06/21 1452 03/06/21 2045 03/07/21 0611  NA 137  --  136  K 4.4  --  3.5  CL 102  --  103  CO2 25  --  26  GLUCOSE 117*  --  99  BUN 12  --  10  CREATININE 1.40*  --  1.17  CALCIUM 9.5  --  8.5*  MG  --  2.3 2.0  PHOS  --   --  3.2   Liver Function Tests: Recent Labs  Lab 03/06/21 1452 03/07/21 0611  AST 19 15  ALT 18 14  ALKPHOS 65 51  BILITOT 0.8 0.7  PROT 8.1 6.8  ALBUMIN 4.3 3.8   Recent Labs  Lab 03/06/21 1452  LIPASE 29   No results for input(s): AMMONIA in the last 168  hours. CBC: Recent Labs  Lab 03/06/21 1452 03/07/21 0611  WBC 11.8* 7.5  NEUTROABS  --  4.5  HGB 17.0 14.3  HCT 47.2 40.9  MCV 93.3 95.1  PLT 201 142*   Cardiac Enzymes: No results for input(s): CKTOTAL, CKMB, CKMBINDEX, TROPONINI in the last 168 hours. BNP: BNP (last 3 results) No results for input(s): BNP in the last 8760 hours.  ProBNP (last 3 results) No results for input(s): PROBNP in the last 8760 hours.  CBG: No results for input(s): GLUCAP in the last 168  hours.     Signed:  Albertine Grates MD, PhD, FACP  [facility] 03/07/2021, 2:10 PM

## 2021-03-07 NOTE — ED Notes (Signed)
Patients IV removed, verbalized understanding of discharge instructions and medications to pick up, NAD noted at discharge. Patient ambulatory to lobby/discharge

## 2021-03-07 NOTE — Consult Note (Signed)
Jose Darby, MD [address]  [place]  [place], [place] [address]  Main: [phone]  Fax: [phone] Pager: [phone]   Consultation  Referring Provider:     No ref. provider found Primary Care Physician:  Lysbeth Galas, NP Primary Gastroenterologist:  Althia Forts       Reason for Consultation:  enteritis  Date of Admission:  03/06/2021 Date of Consultation:  03/07/2021         HPI:   Jose George is a 50 y.o. male with history of HIV on Biktarvy, possible diagnosis of Crohn's disease, tobacco use who is admitted with 3 days history of abdominal discomfort and diarrhea.  His symptoms started on Monday after he had a large meal at [place] [employer].  On admission he was hemodynamically stable, labs revealed mild leukocytosis, normal lactic acid, CMP was normal.  He underwent CT abdomen and pelvis with contrast which suggested by the differential for substantially abnormal small bowel including ileum, distal ileum with wall thickening, mucosal enhancement and air-fluid levels.  Associated with possible intussusception.  Surgery was consulted and there was no indication for acute surgical intervention at this time.  There was no evidence of ischemia as vasculature did not reveal any narrowing or thrombus.  GI consulted for further evaluation.  When I interviewed the patient, he was lying in the bed, reported mild abdominal discomfort, bloating, denied any nausea or vomiting.  His leukocytosis has resolved on empiric antibiotics.  Patient is wanting to go home as he left his pets at home and there is no one to take care of those.  NSAIDs: None  Antiplts/Anticoagulants/Anti thrombotics: None  GI Procedures: Colonoscopy several years ago  Past Medical History:  Diagnosis Date   HIV (human immunodeficiency virus infection) ([facility])    Palpitations     No past surgical history on file.  Prior to Admission medications   Medication Sig Start Date End Date Taking?  Authorizing Provider  bictegravir-emtricitabine-tenofovir AF (BIKTARVY) 50-200-25 MG TABS tablet Take by mouth. 11/30/18  Yes [provider]    Current Facility-Administered Medications:    0.9 %  sodium chloride infusion, , Intravenous, Continuous, Doutova, Anastassia, MD, Stopped at 03/07/21 1316   acetaminophen (TYLENOL) tablet 650 mg, 650 mg, Oral, Q6H PRN **OR** acetaminophen (TYLENOL) suppository 650 mg, 650 mg, Rectal, Q6H PRN, Doutova, Anastassia, MD   bictegravir-emtricitabine-tenofovir AF (BIKTARVY) 50-200-25 MG per tablet 1 tablet, 1 tablet, Oral, Daily, Doutova, Anastassia, MD, 1 tablet at 03/07/21 1011   ciprofloxacin (CIPRO) IVPB 400 mg, 400 mg, Intravenous, Q12H, Doutova, Anastassia, MD, Stopped at 03/07/21 0903   fentaNYL (SUBLIMAZE) injection 12.5-50 mcg, 12.5-50 mcg, Intravenous, Q2H PRN, Doutova, Anastassia, MD   HYDROcodone-acetaminophen (NORCO/VICODIN) 5-325 MG per tablet 1-2 tablet, 1-2 tablet, Oral, Q4H PRN, Doutova, Anastassia, MD   metroNIDAZOLE (FLAGYL) IVPB 500 mg, 500 mg, Intravenous, Q8H, Doutova, Anastassia, MD, Stopped at 03/07/21 1316  Current Outpatient Medications:    bictegravir-emtricitabine-tenofovir AF (BIKTARVY) 50-200-25 MG TABS tablet, Take by mouth., Disp: , Rfl:    ciprofloxacin (CIPRO) 500 MG tablet, Take 1 tablet (500 mg total) by mouth 2 (two) times daily for 3 days., Disp: 6 tablet, Rfl: 0   metroNIDAZOLE (FLAGYL) 500 MG tablet, Take 1 tablet (500 mg total) by mouth 3 (three) times daily for 3 days., Disp: 9 tablet, Rfl: 0   polyethylene glycol (GOLYTELY) 236 g solution, Take 4,000 mLs by mouth once for 1 dose., Disp: 4000 mL, Rfl: 0  Family History  Problem Relation  Age of Onset   CAD Mother      Social History   Tobacco Use   Smoking status: Every Day    Packs/day: 0.10    Types: Cigarettes   Smokeless tobacco: Never  Vaping Use   Vaping Use: Never used  Substance Use Topics   Alcohol use: No   Drug use: Never     Allergies as of 03/06/2021   (No Known Allergies)    Review of Systems:    All systems reviewed and negative except where noted in HPI.   Physical Exam:  Vital signs in last 24 hours: Temp:  [98.3 F (36.8 C)-98.4 F (36.9 C)] 98.4 F (36.9 C) (09/01 1324) Pulse Rate:  [69-95] 70 (09/01 1324) Resp:  [16-20] 16 (09/01 1324) BP: (131-145)/(84-99) 145/92 (09/01 1324) SpO2:  [96 %-98 %] 98 % (09/01 1324)   General:   Pleasant, cooperative in NAD Head:  Normocephalic and atraumatic. Eyes:   No icterus.   Conjunctiva pink. PERRLA. Ears:  Normal auditory acuity. Neck:  Supple; no masses or thyroidomegaly Lungs: Respirations even and unlabored. Lungs clear to auscultation bilaterally.   No wheezes, crackles, or rhonchi.  Heart:  Regular rate and rhythm;  Without murmur, clicks, rubs or gallops Abdomen:  Soft, mildly distended, nontender. Normal bowel sounds. No appreciable masses or hepatomegaly.  No rebound or guarding.  Rectal:  Not performed. Msk:  Symmetrical without gross deformities.  Strength normal Extremities:  Without edema, cyanosis or clubbing. Neurologic:  Alert and oriented x3;  grossly normal neurologically. Skin:  Intact without significant lesions or rashes. Cervical Nodes:  No significant cervical adenopathy. Psych:  Alert and cooperative. Normal affect.  LAB RESULTS: CBC Latest Ref Rng & Units 03/07/2021 03/06/2021 05/08/2019  WBC 4.0 - 10.5 K/uL 7.5 11.8(H) 11.5(H)  Hemoglobin 13.0 - 17.0 g/dL 14.3 17.0 14.4  Hematocrit 39.0 - 52.0 % 40.9 47.2 43.7  Platelets 150 - 400 K/uL 142(L) 201 213    BMET BMP Latest Ref Rng & Units 03/07/2021 03/06/2021 05/08/2019  Glucose 70 - 99 mg/dL 99 117(H) 109(H)  BUN 6 - 20 mg/dL '10 12 12  ' Creatinine 0.61 - 1.24 mg/dL 1.17 1.40(H) 1.34(H)  Sodium 135 - 145 mmol/L 136 137 139  Potassium 3.5 - 5.1 mmol/L 3.5 4.4 3.2(L)  Chloride 98 - 111 mmol/L 103 102 104  CO2 22 - 32 mmol/L '26 25 25  ' Calcium 8.9 - 10.3 mg/dL 8.5(L) 9.5 9.1     LFT Hepatic Function Latest Ref Rng & Units 03/07/2021 03/06/2021 01/26/2018  Total Protein 6.5 - 8.1 g/dL 6.8 8.1 7.9  Albumin 3.5 - 5.0 g/dL 3.8 4.3 4.7  AST 15 - 41 U/L '15 19 22  ' ALT 0 - 44 U/L '14 18 17  ' Alk Phosphatase 38 - 126 U/L 51 65 65  Total Bilirubin 0.3 - 1.2 mg/dL 0.7 0.8 0.6     STUDIES: CT ABDOMEN PELVIS W CONTRAST  Result Date: 03/06/2021 CLINICAL DATA:  Abdominal pain starting on Monday. EXAM: CT ABDOMEN AND PELVIS WITH CONTRAST TECHNIQUE: Multidetector CT imaging of the abdomen and pelvis was performed using the standard protocol following bolus administration of intravenous contrast. CONTRAST:  111m OMNIPAQUE IOHEXOL 350 MG/ML SOLN COMPARISON:  Overlapping portions of CT chest 05/08/2019 FINDINGS: Lower chest: Mild dependent subsegmental atelectasis in both lower lobes. Hepatobiliary: The liver and gallbladder appear unremarkable. No biliary dilatation. 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 Otherwise unremarkable. Stomach/Bowel: Left  upper quadrant jejunal jejunal intussusception with involve segment about 5.2 cm in length. There is substantial left abnormal bowel wall thickening and accentuated mucosal enhancement throughout the ileum and potentially involving portions of the distal jejunum. Fluid-filled cecum although without the degree of wall thickening in the cecum as that encountered in the distal small bowel. Some of the fluid-filled distal small bowel loops are mildly dilated measuring up to about 3.2 cm in diameter. The appendix appears normal. Small bowel loops demonstrate a targetoid appearance with enhancement of the mucosa and hypodense submucosa. Vascular/Lymphatic: There is some mild distal abdominal aortic atherosclerotic calcification extending into the iliac arteries but no significant atheromatous plaque involvement or narrowing in the celiac trunk or SMA.  The IMA appears patent. The portal vein and inferior mesenteric vein appear unremarkable. Reproductive: Unremarkable Other: Mild scattered ascites most notable along the hepatic margin, pelvis, and right lower quadrant mesentery. Musculoskeletal: Unremarkable IMPRESSION: 1. Substantial abnormality of the small bowel including the ileum and potentially some of the distal jejunum, with wall thickening, accentuated mucosal enhancement, scattered air-fluid levels, and some mildly dilated loops in addition to mild ascites. In addition, there is a segment of intussusception in the jejunum which is well proximal to this region of involvement, and not associated with proximal small bowel dilatation. Possibilities given this appearance could include ischemia, inflammatory bowel disease, angioedema, or an unusual distal enteritis. Although there is some minor atherosclerosis, the celiac trunk, SMA and its branches, and IMA appear widely patent, and I do not see any thrombus in the inferior mesenteric vein or its tributaries to further indicate a venous cause. Vasospasm or vasculitis could potentially cause this appearance but I do not see irregularity of the mesenteric vessels. Crohn's disease is possible but the long region of involvement including almost the entire ileum would be somewhat unusual. Small bowel angioedema can sometimes cause a similar appearance there is no pneumatosis or portal venous gas. I do not see any swirling of the mesentery to indicate a clip is loop obstruction and the wall thickening extends all the way to the terminal ileum and ileocecal valve, which would be highly unusual for a closed loop obstruction. There is fluid contents in the cecum with a normal appearance of the appendix but no overt colon wall thickening. Correlate with patient's symptoms in differentiating between infectious etiologies, vaso spasm/vasculitis, and inflammatory bowel disease. 2. As noted above, there is a small segment  of jejuno-jejunal intussusception, but without proximal bowel dilatation to indicate that this site is obstructive. In adults, sites of of small bowel intussusception often have an underlying lesion. Electronically Signed   By: Van Clines M.D.   On: 03/06/2021 19:47      Impression / Plan:   Rhonda Vangieson is a 50 y.o. pleasant Caucasian male with HIV on Biktarvy, tobacco use, possible Crohn's is admitted with abdominal discomfort and diarrhea.  Stool studies are negative for infection including C. difficile.  CT revealed thickening and mucosal enhancement of the ileum and jejunum with possible intussusception.  Patient is clinically doing well and he prefers to go home.  I discussed with him about colonoscopy with TI evaluation particularly with history of possible Crohn's.  Surgery has already seen the patient and did not recommend any surgical intervention at this time.  Patient will be going home today, his colonoscopy is scheduled with me on 9/13 as outpatient Recommend Cipro and Flagyl 500 twice daily for 3 days Discussed in detail regarding return to ER precautions if he  develops any worsening of GI symptoms.  Advised him to stay on liquid to soft diet only  Thank you for involving me in the care of this patient.      LOS: 1 day   Sherri Sear, MD  03/07/2021, 5:37 PM    Note: This dictation was prepared with Dragon dictation along with smaller phrase technology. Any transcriptional errors that result from this process are unintentional.

## 2021-03-07 NOTE — ED Notes (Signed)
Pyxis out of flagyl, pharmacy contacted

## 2021-03-07 NOTE — ED Notes (Signed)
Pt ambulated to restroom, returned to bed and placed on card monitor; no other needs voiced at this time.

## 2021-03-08 DIAGNOSIS — Z09 Encounter for follow-up examination after completed treatment for conditions other than malignant neoplasm: Principal | ICD-10-CM

## 2021-03-08 LAB — HELPER T-LYMPH-CD4 (ARMC ONLY)
% CD 4 Pos. Lymph.: 44.1 % (ref 30.8–58.5)
Absolute CD 4 Helper: 882 /uL (ref 359–1519)
Basophils Absolute: 0 10*3/uL (ref 0.0–0.2)
Basos: 1 %
EOS (ABSOLUTE): 0.2 10*3/uL (ref 0.0–0.4)
Eos: 3 %
Hematocrit: 42.5 % (ref 37.5–51.0)
Hemoglobin: 14.4 g/dL (ref 13.0–17.7)
Immature Grans (Abs): 0 10*3/uL (ref 0.0–0.1)
Immature Granulocytes: 0 %
Lymphocytes Absolute: 2 10*3/uL (ref 0.7–3.1)
Lymphs: 27 %
MCH: 31.9 pg (ref 26.6–33.0)
MCHC: 33.9 g/dL (ref 31.5–35.7)
MCV: 94 fL (ref 79–97)
Monocytes Absolute: 0.7 10*3/uL (ref 0.1–0.9)
Monocytes: 9 %
Neutrophils Absolute: 4.5 10*3/uL (ref 1.4–7.0)
Neutrophils: 60 %
Platelets: 153 10*3/uL (ref 150–450)
RBC: 4.51 x10E6/uL (ref 4.14–5.80)
RDW: 12.3 % (ref 11.6–15.4)
WBC: 7.4 10*3/uL (ref 3.4–10.8)

## 2021-03-08 LAB — HIV-1 RNA QUANT-NO REFLEX-BLD
HIV 1 RNA Quant: <20 copies/mL
LOG10 HIV-1 RNA: UNDETERMINED log10copy/mL

## 2021-03-18 ENCOUNTER — Encounter: Payer: Self-pay | Admitting: Gastroenterology

## 2021-03-19 ENCOUNTER — Ambulatory Visit: Admission: RE | Admit: 2021-03-19 | Payer: Self-pay | Source: Home / Self Care | Admitting: Gastroenterology

## 2021-03-19 ENCOUNTER — Encounter: Admission: RE | Payer: Self-pay | Source: Home / Self Care

## 2021-03-19 SURGERY — COLONOSCOPY WITH PROPOFOL
Anesthesia: General

## 2021-04-03 ENCOUNTER — Ambulatory Visit: Admit: 2021-04-03

## 2021-04-10 ENCOUNTER — Ambulatory Visit: Payer: Self-pay | Admitting: Family Medicine

## 2021-04-10 ENCOUNTER — Other Ambulatory Visit: Payer: Self-pay

## 2021-04-10 ENCOUNTER — Encounter: Payer: Self-pay | Admitting: Family Medicine

## 2021-04-10 DIAGNOSIS — Z23 Encounter for immunization: Secondary | ICD-10-CM

## 2021-04-10 DIAGNOSIS — Z202 Contact with and (suspected) exposure to infections with a predominantly sexual mode of transmission: Secondary | ICD-10-CM

## 2021-04-10 DIAGNOSIS — Z7185 Encounter for immunization safety counseling: Secondary | ICD-10-CM

## 2021-04-10 DIAGNOSIS — Z113 Encounter for screening for infections with a predominantly sexual mode of transmission: Secondary | ICD-10-CM

## 2021-04-10 MED ORDER — DOXYCYCLINE HYCLATE 100 MG PO TABS: 100.0000 mg | ORAL_TABLET | Freq: Two times a day (BID) | ORAL | 0 refills | Status: AC

## 2021-04-10 NOTE — Progress Notes (Signed)
1.Have you been close physical contact with someone diagnosed with monkeypox in the last 14 days? No  2. For men who have sex with me (MSM), or transgender individuals, did you in the last 90 days:               1. Have multiple or anonymous sex partners? Yes               2. Receive a diagnosis of a sexually transmitted infection?No               3. Receive HIV pre-exposure prophylaxis (PrEP)?No  3. Are you having any symptoms such as a new rash, lesions, or bumps?No  4. Do you have any allergies to egg protein, gentamicin, ciprofloxacin?No  5. Do you currently take Deflazacort (Calcort)?No  6. Do you currently take any of the precautionary medications listed:No   Saphnelo (Anifrolumab-fnia)             Ilaris (Canakinumab)             Dupixent (Dupilumab)             Gilenya (Fingolimed)             Taltz (Ixekizumab)             Zeposia (Ozanimod)             Ponvory (Ponesimod)             Cosentyx (Secukinumab)             Mayzent (Siponimod)              Adbry (Tralokinumab-Idrm)             Lupkynis (Voclosporin)   7. Have you had a previous dose of Smallpox vaccine in the last 3 years?No  8. Do you have a history of keloid scarring?No  9. Jynneos vaccine is a two-dose vaccine series.  Is this your first or second dose of Jynneos?1st

## 2021-04-10 NOTE — Progress Notes (Signed)
[facility] Department STI clinic/screening visit  Subjective:  Jose George is a 50 y.o. male being seen today for an STI screening visit. The patient reports they do not have symptoms.    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 No  Screening for MPX risk: Does the patient have an unexplained rash? No Is the patient MSM? Yes Does the patient endorse multiple sex partners or anonymous sex partners? No Did the patient have close or sexual contact with a person diagnosed with MPX? No Has the patient traveled outside the [place] where MPX is endemic? No Is there a high clinical suspicion for MPX-- evidenced by one of the following No  -Unlikely to be chickenpox  -Lymphadenopathy  -Rash that present in same phase of evolution on any given body part   See flowsheet for further details and programmatic requirements.    The following portions of the patient's history were reviewed and updated as appropriate: allergies, current medications, past medical history, past social history, past surgical history and problem list.  Objective:  There were no vitals filed for this visit.  Physical Exam Constitutional:      Appearance: Normal appearance.  HENT:     Head: Normocephalic.     Mouth/Throat:     Mouth: Mucous membranes are moist.     Pharynx: Oropharynx is clear. No oropharyngeal exudate.  Pulmonary:     Effort: Pulmonary effort is  normal.  Genitourinary:    Comments: Deferred- no s/sx, urine sample collected  Musculoskeletal:     Cervical back: Normal range of motion and neck supple.  Lymphadenopathy:     Cervical: No cervical adenopathy.  Skin:    General: Skin is warm and dry.     Findings: No bruising, erythema, lesion or rash.  Neurological:     Mental Status: He is alert and oriented to person, place, and time.  Psychiatric:        Mood and Affect: Mood normal.        Behavior: Behavior normal.      Assessment and Plan:  Jose George is a 50 y.o. male presenting to the [facility] Department for STI screening  1. Screening examination for venereal disease Patient does not have STI symptoms Patient accepted all screenings including  oral, urine CT/GC and declines bloodwork for HIV/RPR.  Patient meets criteria for HepB screening? Yes. Ordered? No - declined  Patient meets criteria for HepC screening? Yes. Ordered? No - declined  Recommended condom use with all sex Discussed importance of condom use for STI prevent   - Gonococcus culture - Chlamydia/Gonorrhea [facility]   2. Exposure to chlamydia -pt treated as contact.   - doxycycline (VIBRA-TABS) 100 MG tablet; Take 1 tablet (100 mg total) by mouth 2 (two) times daily for 7 days.  Dispense: 14 tablet; Refill: 0  3. Immunization counseling Discussed monkey pox vaccine with patient.   Pt desires vaccine today.  Counseling education, and  vaccine given by RN. See RN document.   Discussed time line for [facility] results and that patient will be called with positive results and encouraged patient to call if he had not heard in 2 weeks Recommended returning for continued or worsening symptoms.   No follow-ups on file.  No future appointments.  Wendi Snipes, FNP

## 2021-04-14 LAB — GONOCOCCUS CULTURE

## 2021-04-14 NOTE — Progress Notes (Signed)
Chart reviewed by Pharmacist  Suzanne Walker PharmD, Contract Pharmacist at [facility]  

## 2021-07-10 ENCOUNTER — Emergency Department
Admission: EM | Admit: 2021-07-10 | Discharge: 2021-07-10 | Disposition: A | Discharge status: Home / Self Care | Payer: BC Managed Care – PPO | Attending: Emergency Medicine | Admitting: Emergency Medicine

## 2021-07-10 ENCOUNTER — Emergency Department: Payer: BC Managed Care – PPO

## 2021-07-10 ENCOUNTER — Other Ambulatory Visit: Payer: Self-pay

## 2021-07-10 DIAGNOSIS — I10 Essential (primary) hypertension: Secondary | ICD-10-CM | POA: Diagnosis not present

## 2021-07-10 DIAGNOSIS — G609 Hereditary and idiopathic neuropathy, unspecified: Secondary | ICD-10-CM | POA: Diagnosis not present

## 2021-07-10 DIAGNOSIS — R209 Unspecified disturbances of skin sensation: Secondary | ICD-10-CM | POA: Insufficient documentation

## 2021-07-10 DIAGNOSIS — G629 Polyneuropathy, unspecified: Secondary | ICD-10-CM

## 2021-07-10 LAB — DIFFERENTIAL
Abs Immature Granulocytes: 0.05 10*3/uL (ref 0.00–0.07)
Basophils Absolute: 0.1 10*3/uL (ref 0.0–0.1)
Basophils Relative: 1 %
Eosinophils Absolute: 0.4 10*3/uL (ref 0.0–0.5)
Eosinophils Relative: 4 %
Immature Granulocytes: 1 %
Lymphocytes Relative: 35 %
Lymphs Abs: 3 10*3/uL (ref 0.7–4.0)
Monocytes Absolute: 0.6 10*3/uL (ref 0.1–1.0)
Monocytes Relative: 7 %
Neutro Abs: 4.5 10*3/uL (ref 1.7–7.7)
Neutrophils Relative %: 52 %

## 2021-07-10 LAB — CBC
HCT: 43.1 % (ref 39.0–52.0)
Hemoglobin: 14.6 g/dL (ref 13.0–17.0)
MCH: 32.1 pg (ref 26.0–34.0)
MCHC: 33.9 g/dL (ref 30.0–36.0)
MCV: 94.7 fL (ref 80.0–100.0)
Platelets: 207 10*3/uL (ref 150–400)
RBC: 4.55 MIL/uL (ref 4.22–5.81)
RDW: 12.2 % (ref 11.5–15.5)
WBC: 8.5 10*3/uL (ref 4.0–10.5)
nRBC: 0 % (ref 0.0–0.2)

## 2021-07-10 LAB — COMPREHENSIVE METABOLIC PANEL
ALT: 22 U/L (ref 0–44)
AST: 24 U/L (ref 15–41)
Albumin: 4.1 g/dL (ref 3.5–5.0)
Alkaline Phosphatase: 60 U/L (ref 38–126)
Anion gap: 8 (ref 5–15)
BUN: 10 mg/dL (ref 6–20)
CO2: 24 mmol/L (ref 22–32)
Calcium: 9.5 mg/dL (ref 8.9–10.3)
Chloride: 103 mmol/L (ref 98–111)
Creatinine, Ser: 1.2 mg/dL (ref 0.61–1.24)
GFR, Estimated: >60 mL/min (ref >60)
Glucose, Bld: 104 mg/dL — ABNORMAL HIGH (ref 70–99)
Potassium: 4 mmol/L (ref 3.5–5.1)
Sodium: 135 mmol/L (ref 135–145)
Total Bilirubin: 0.6 mg/dL (ref 0.3–1.2)
Total Protein: 7.9 g/dL (ref 6.5–8.1)

## 2021-07-10 LAB — PROTIME-INR
INR: 0.9 (ref 0.8–1.2)
Prothrombin Time: 12 seconds (ref 11.4–15.2)

## 2021-07-10 LAB — APTT: aPTT: 35 seconds (ref 24–36)

## 2021-07-10 MED ORDER — SODIUM CHLORIDE 0.9% FLUSH: 3.0000 mL | Freq: Once | INTRAVENOUS | Status: DC

## 2021-07-10 NOTE — ED Triage Notes (Signed)
Pt comes with c/o HTN and tingling all over. Pt denies any dizziness. Pt states this started last night. Pt states last month he had some blurry vision that was on and off.   Pt states some chills and numbness.pt denies any pain.

## 2021-07-10 NOTE — ED Provider Triage Note (Cosign Needed)
Emergency Medicine Provider Triage Evaluation Note  Kamdyn Colborn , a 51 y.o. male  was evaluated in triage.  Pt complains of hypertension--173/110 at home-- and intermittent tingling and "light" chest pain especially when lying down. Had been off metoprolol for about a year, but 3 days ago noticed that BP was high again and started taking it.  Review of Systems  Positive: Tingling and chest pressue Negative: Shortness of breath  Physical Exam  There were no vitals taken for this visit. Gen:   Awake, no distress   Resp:  Normal effort  MSK:   Moves extremities without difficulty  Other:    Medical Decision Making  Medically screening exam initiated at 11:51 AM.  Appropriate orders placed.  Maysin Carstens was informed that the remainder of the evaluation will be completed by another provider, this initial triage assessment does not replace that evaluation, and the importance of remaining in the ED until their evaluation is complete.    Chinita Pester, FNP 07/10/21 1155

## 2021-07-16 NOTE — ED Provider Notes (Signed)
[facility] Provider Note    Event Date/Time   First MD Initiated Contact with Patient 07/10/21 1354     (approximate)   History   Hypertension   HPI  Jose George is a 51 y.o. male with history of high blood pressure he was been off medications for some time but recently restarted them presents with complaints of intermittent tingling sensation in his body.  He checked his blood pressure and found to be elevated at home and became concerned.  He denies chest pain to me. No nausea or vomiting.  No headache.  No weakness     Physical Exam   Triage Vital Signs: ED Triage Vitals  Enc Vitals Group     BP 07/10/21 1152 (!) 143/101     Pulse Rate 07/10/21 1152 86     Resp 07/10/21 1152 18     Temp 07/10/21 1152 98.2 F (36.8 C)     Temp Source 07/10/21 1152 Oral     SpO2 07/10/21 1152 95 %     Weight --      Height --      Head Circumference --      Peak Flow --      Pain Score 07/10/21 1118 0     Pain Loc --      Pain Edu? --      Excl. in GC? --     Most recent vital signs: Vitals:   07/10/21 1152 07/10/21 1436  BP: (!) 143/101 125/86  Pulse: 86 72  Resp: 18 16  Temp: 98.2 F (36.8 C)   SpO2: 95% 93%     General: Awake, no distress.  CV:  Good peripheral perfusion.  RRR Resp:  Normal effort.  CTA B Abd:  No distention.  Other:  Cranial nerves II through XII are normal, neuro intact   ED Results / Procedures / Treatments   Labs (all labs ordered are listed, but only abnormal results are displayed) Labs Reviewed  COMPREHENSIVE METABOLIC PANEL - Abnormal; Notable for the following components:      Result Value   Glucose, Bld 104 (*)    All other components within normal limits  PROTIME-INR  APTT  CBC  DIFFERENTIAL     EKG  ED ECG REPORT I, Jene Every, the attending physician, personally viewed and interpreted this ECG.   Rhythm: normal sinus rhythm QRS Axis: normal Intervals: normal ST/T Wave  abnormalities: normal Narrative Interpretation: no evidence of acute ischemia    RADIOLOGY CT head reviewed by me, no acute abnormality, confirmed by radiology    PROCEDURES:  Critical Care performed:   Procedures   MEDICATIONS ORDERED IN ED: Medications - No data to display   IMPRESSION / MDM / ASSESSMENT AND PLAN / ED COURSE  I reviewed the triage vital signs and the nursing notes.  Patient presents with multiple complaints including high blood pressure, and tingling sensation, now resolved.  Blood pressure is improved here in the emergency department without intervention  CT head is overall unremarkable, lab work reviewed and is reassuring, CBC CMP are normal.  EKG without evidence of any ischemia or abnormalities.  Given resolution of symptoms, appropriate for outpatient follow-up with PCP,          FINAL CLINICAL IMPRESSION(S) / ED DIAGNOSES   Final diagnoses:  Primary hypertension  Neuropathy     Rx / DC Orders   ED Discharge Orders     None  Note:  This document was prepared using Dragon voice recognition software and may include unintentional dictation errors.   Jene Every, MD 07/16/21 [phone]

## 2021-08-04 ENCOUNTER — Other Ambulatory Visit: Payer: Self-pay

## 2021-08-04 DIAGNOSIS — R7989 Other specified abnormal findings of blood chemistry: Secondary | ICD-10-CM | POA: Diagnosis not present

## 2021-08-04 DIAGNOSIS — R002 Palpitations: Secondary | ICD-10-CM | POA: Insufficient documentation

## 2021-08-04 DIAGNOSIS — R0789 Other chest pain: Secondary | ICD-10-CM | POA: Insufficient documentation

## 2021-08-04 DIAGNOSIS — R000 Tachycardia, unspecified: Secondary | ICD-10-CM | POA: Diagnosis present

## 2021-08-04 DIAGNOSIS — R42 Dizziness and giddiness: Secondary | ICD-10-CM | POA: Insufficient documentation

## 2021-08-04 LAB — CBC
HCT: 43.4 % (ref 39.0–52.0)
Hemoglobin: 14.5 g/dL (ref 13.0–17.0)
MCH: 31.9 pg (ref 26.0–34.0)
MCHC: 33.4 g/dL (ref 30.0–36.0)
MCV: 95.6 fL (ref 80.0–100.0)
Platelets: 211 10*3/uL (ref 150–400)
RBC: 4.54 MIL/uL (ref 4.22–5.81)
RDW: 11.9 % (ref 11.5–15.5)
WBC: 13.5 10*3/uL — ABNORMAL HIGH (ref 4.0–10.5)
nRBC: 0 % (ref 0.0–0.2)

## 2021-08-04 LAB — BASIC METABOLIC PANEL
Anion gap: 8 (ref 5–15)
BUN: 10 mg/dL (ref 6–20)
CO2: 26 mmol/L (ref 22–32)
Calcium: 9.4 mg/dL (ref 8.9–10.3)
Chloride: 101 mmol/L (ref 98–111)
Creatinine, Ser: 1.42 mg/dL — ABNORMAL HIGH (ref 0.61–1.24)
GFR, Estimated: >60 mL/min (ref >60)
Glucose, Bld: 127 mg/dL — ABNORMAL HIGH (ref 70–99)
Potassium: 3.9 mmol/L (ref 3.5–5.1)
Sodium: 135 mmol/L (ref 135–145)

## 2021-08-04 LAB — TROPONIN I (HIGH SENSITIVITY): Troponin I (High Sensitivity): 4 ng/L (ref <18)

## 2021-08-04 NOTE — ED Triage Notes (Signed)
Pt states he took a thc gummy approx 1800. Pt states he now is having tachycardia and chest pain. Pt appears in no acute distress.

## 2021-08-05 ENCOUNTER — Emergency Department
Admission: EM | Admit: 2021-08-05 | Discharge: 2021-08-05 | Disposition: A | Discharge status: Home / Self Care | Payer: BC Managed Care – PPO | Attending: Emergency Medicine | Admitting: Emergency Medicine

## 2021-08-05 ENCOUNTER — Emergency Department: Payer: BC Managed Care – PPO

## 2021-08-05 DIAGNOSIS — R002 Palpitations: Secondary | ICD-10-CM

## 2021-08-05 LAB — TROPONIN I (HIGH SENSITIVITY): Troponin I (High Sensitivity): 4 ng/L (ref <18)

## 2021-08-05 LAB — MAGNESIUM: Magnesium: 2.3 mg/dL (ref 1.7–2.4)

## 2021-08-05 LAB — TSH: TSH: 1.34 u[IU]/mL (ref 0.350–4.500)

## 2021-08-05 MED ORDER — LORAZEPAM 2 MG/ML IJ SOLN
1.0000 mg | Freq: Once | INTRAMUSCULAR | Status: AC
Start: 2021-08-05 — End: 2021-08-05
  Administered 2021-08-05: 1 mg via INTRAVENOUS
  Filled 2021-08-05: qty 1

## 2021-08-05 MED ORDER — SODIUM CHLORIDE 0.9 % IV BOLUS (SEPSIS)
1000.0000 mL | Freq: Once | INTRAVENOUS | Status: AC
  Administered 2021-08-05: 1000 mL via INTRAVENOUS

## 2021-08-05 NOTE — ED Provider Notes (Signed)
[facility] Provider Note    Event Date/Time   First MD Initiated Contact with Patient 08/05/21 0104     (approximate)   History   Tachycardia   HPI  Jose George is a 51 y.o. 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  He states that he is starting to slowly feel better.  Denies any other coingestions.  States he took the edible to try to relax.  States this happened to him once before when he smoked a joint about a year and a half ago but states at that time he was taking medication for a sinus infection and thought that this was what caused him to have such a severe reaction with chest pain and palpitations then.  History provided by patient and family member at bedside.    Past Medical History:  Diagnosis Date   HIV (human immunodeficiency virus infection) (HCC)    Palpitations     No past surgical history on file.  MEDICATIONS:  Prior to Admission medications   Medication Sig Start Date End Date Taking? Authorizing Provider  bictegravir-emtricitabine-tenofovir AF (BIKTARVY) 50-200-25 MG TABS tablet Take by mouth. 11/30/18   [provider]    Physical Exam   Triage Vital Signs: ED Triage Vitals  Enc Vitals Group     BP 08/04/21 2250 (!) 147/110     Pulse Rate 08/04/21 2250 (!) 120     Resp 08/04/21 2250 18     Temp 08/04/21 2250 98 F (36.7 C)     Temp Source 08/04/21 2250 Oral     SpO2 08/04/21 2250 100 %     Weight 08/04/21 2251 198 lb (89.8 kg)     Height 08/04/21 2251 5\' 10"  (1.778 m)     Head Circumference --      Peak Flow --      Pain Score 08/04/21 2251 7     Pain Loc --      Pain Edu? --      Excl. in GC? --     Most recent vital signs: Vitals:   08/05/21 0042 08/05/21 0230  BP:  115/86 118/79  Pulse: 100 79  Resp: 16 19  Temp: 98 F (36.7 C) 98 F (36.7 C)  SpO2: 100% 97%    CONSTITUTIONAL: Alert and oriented and responds appropriately to questions. Well-appearing; well-nourished HEAD: Normocephalic, atraumatic EYES: Conjunctivae clear, pupils appear equal, sclera nonicteric ENT: normal nose; moist mucous membranes NECK: Supple, normal ROM CARD: RRR; S1 and S2 appreciated; no murmurs, no clicks, no rubs, no gallops RESP: Normal chest excursion without splinting or tachypnea; breath sounds clear and equal bilaterally; no wheezes, no rhonchi, no rales, no hypoxia or respiratory distress, speaking full sentences ABD/GI: Normal bowel sounds; non-distended; soft, non-tender, no rebound, no guarding, no peritoneal signs BACK: The back appears normal EXT: Normal ROM in all joints; no deformity noted, no edema; no cyanosis SKIN: Normal color for age and race; warm; no rash on exposed skin NEURO: Moves all extremities equally, normal speech PSYCH: The patient's mood and manner are appropriate.   ED Results / Procedures / Treatments   LABS: (all labs ordered are listed, but only abnormal results are displayed) Labs Reviewed  BASIC METABOLIC PANEL - Abnormal; Notable for the following components:  Result Value   Glucose, Bld 127 (*)    Creatinine, Ser 1.42 (*)    All other components within normal limits  CBC - Abnormal; Notable for the following components:   WBC 13.5 (*)    All other components within normal limits  MAGNESIUM  TSH  TROPONIN I (HIGH SENSITIVITY)  TROPONIN I (HIGH SENSITIVITY)     EKG:  EKG Interpretation  Date/Time:  Sunday August 04 2021 22:59:57 EST Ventricular Rate:  120 PR Interval:  164 QRS Duration: 78 QT Interval:  302 QTC Calculation: 426 R Axis:   47 Text Interpretation: Sinus tachycardia T wave abnormality, consider inferior ischemia Abnormal ECG When compared with ECG of 10-Jul-2021 11:59, Vent. rate has increased  BY  43 BPM T wave inversion now evident in Lateral leads but similar to August 2022 Confirmed by Rochele Raring [phone]) on 08/05/2021 1:28:12 AM         RADIOLOGY: My personal review and interpretation of imaging: Chest x-ray clear.  I have personally reviewed all radiology reports.   DG Chest Portable 1 View  Result Date: 08/05/2021 CLINICAL DATA:  Chest pain EXAM: PORTABLE CHEST 1 VIEW COMPARISON:  05/08/2019 FINDINGS: Lungs are clear.  No pleural effusion or pneumothorax. The heart is normal in size. IMPRESSION: No evidence of acute cardiopulmonary disease. Electronically Signed   By: Charline Bills M.D.   On: 08/05/2021 02:07     PROCEDURES:  Critical Care performed: No      .1-3 Lead EKG Interpretation Performed by: Carroll Ranney, Layla Maw, DO Authorized by: Knight Oelkers, Layla Maw, DO     Interpretation: normal     ECG rate:  77   ECG rate assessment: normal     Rhythm: sinus rhythm     Ectopy: none     Conduction: normal      IMPRESSION / MDM / ASSESSMENT AND PLAN / ED COURSE  I reviewed the triage vital signs and the nursing notes.    Patient here with palpitations, chest pain after consuming a marijuana edible at about 6:30 PM.  The patient is on the cardiac monitor to evaluate for evidence of arrhythmia and/or significant heart rate changes.   DIFFERENTIAL DIAGNOSIS (includes but not limited to):   Medication side effect, arrhythmia, ACS, PE, dissection, electrolyte derangement, anemia, dehydration, infection   PLAN: We will obtain CBC, BMP, magnesium, TSH, troponin.  Will give IV fluids, Ativan.  Will obtain EKG, chest x-ray and monitor on cardiac monitoring.   MEDICATIONS GIVEN IN ED: Medications  sodium chloride 0.9 % bolus 1,000 mL (0 mLs Intravenous Stopped 08/05/21 0231)  LORazepam (ATIVAN) injection 1 mg (1 mg Intravenous Given 08/05/21 0133)     ED COURSE: Patient's labs reassuring.  Normal hemoglobin, potassium and magnesium, TSH.  Troponin x2 negative.   Minimally elevated creatinine which is near his baseline.  Initially tachycardic on arrival but with IV fluids and medication he reports feeling better.  Suspect symptoms secondary to consuming a marijuana edible tonight.  Given he is feeling better with reassuring work-up, I feel he is safe to be discharged.  He has been monitored on cardiac monitoring and no arrhythmia noted.  EKG today shows no ischemia, interval abnormality, Brugada, delta wave/WPW.  I feel he is safe for discharge.   At this time, I do not feel there is any life-threatening condition present. I reviewed all nursing notes, vitals, pertinent previous records.  All lab and urine results, EKGs, imaging ordered have been independently reviewed and interpreted by myself.  I reviewed all available radiology reports from any imaging ordered this visit.  Based on my assessment, I feel the patient is safe to be discharged home without further emergent workup and can continue workup as an outpatient as needed. Discussed all findings, treatment plan as well as usual and customary return precautions with patient and family.  They verbalize understanding and are comfortable with this plan.  Outpatient follow-up has been provided as needed.  All questions have been answered.   CONSULTS: No admission to the hospital needed at this time given symptoms have resolved, heart rate is normal, cardiac work-up is negative.  Patient has no significant risk factors for ACS or PE.   OUTSIDE RECORDS REVIEWED: Reviewed [facility].  Reviewed patient's previous cardiology note in February 2021.  [facility] for PVCs and nonsustained ventricular tachycardia.  Was on flecainide and beta-blockers.  Has subsequently come off of his flecainide and is still taking beta-blockers.  Reports compliance with this medication.     I do not feel any of his medications need to be  adjusted from the emergency department.  Have recommended that he avoid marijuana and other illicit drugs in the future.         FINAL CLINICAL IMPRESSION(S) / ED DIAGNOSES   Final diagnoses:  Palpitations     Rx / DC Orders   ED Discharge Orders     None        Note:  This document was prepared using Dragon voice recognition software and may include unintentional dictation errors.   Damiya Sandefur, Layla MawKristen N, DO 08/05/21 [phone]

## 2021-08-14 ENCOUNTER — Institutional Professional Consult (permissible substitution): Admit: 2021-08-14 | Discharge: 2021-08-15 | Payer: PRIVATE HEALTH INSURANCE

## 2021-08-14 DIAGNOSIS — F17200 Nicotine dependence, unspecified, uncomplicated: Principal | ICD-10-CM

## 2021-08-14 DIAGNOSIS — J329 Chronic sinusitis, unspecified: Principal | ICD-10-CM

## 2021-08-14 DIAGNOSIS — R000 Tachycardia, unspecified: Principal | ICD-10-CM

## 2021-08-14 DIAGNOSIS — B20 Human immunodeficiency virus [HIV] disease: Principal | ICD-10-CM

## 2021-08-14 DIAGNOSIS — F410 Panic disorder [episodic paroxysmal anxiety] without agoraphobia: Principal | ICD-10-CM

## 2021-08-14 MED ORDER — METOPROLOL SUCCINATE ER 50 MG TABLET,EXTENDED RELEASE 24 HR
ORAL_TABLET | Freq: Every day | ORAL | 5 refills | 30 days | Status: CN
Start: 2021-08-14 — End: ?

## 2021-08-14 MED ORDER — PROPRANOLOL 10 MG TABLET
ORAL_TABLET | Freq: Two times a day (BID) | ORAL | 11 refills | 30.00000 days | Status: CP
Start: 2021-08-14 — End: 2022-08-14

## 2021-08-14 MED ORDER — FLUTICASONE PROPIONATE 50 MCG/ACTUATION NASAL SPRAY,SUSPENSION
Freq: Every day | NASAL | 1 refills | 123 days | Status: CP
Start: 2021-08-14 — End: ?

## 2021-09-04 ENCOUNTER — Ambulatory Visit: Admit: 2021-09-04 | Discharge: 2021-09-05 | Payer: PRIVATE HEALTH INSURANCE

## 2021-09-04 DIAGNOSIS — F17200 Nicotine dependence, unspecified, uncomplicated: Principal | ICD-10-CM

## 2021-09-04 DIAGNOSIS — J329 Chronic sinusitis, unspecified: Principal | ICD-10-CM

## 2021-09-04 DIAGNOSIS — F419 Anxiety disorder, unspecified: Principal | ICD-10-CM

## 2021-09-04 DIAGNOSIS — B20 Human immunodeficiency virus [HIV] disease: Principal | ICD-10-CM

## 2021-09-04 DIAGNOSIS — H539 Unspecified visual disturbance: Principal | ICD-10-CM

## 2021-09-06 DIAGNOSIS — E781 Pure hyperglyceridemia: Principal | ICD-10-CM

## 2021-09-26 DIAGNOSIS — Z21 Asymptomatic human immunodeficiency virus [HIV] infection status: Principal | ICD-10-CM

## 2021-09-26 MED ORDER — BIKTARVY 50 MG-200 MG-25 MG TABLET
ORAL_TABLET | Freq: Every day | ORAL | 11 refills | 30 days
Start: 2021-09-26 — End: ?

## 2021-09-27 MED ORDER — BIKTARVY 50 MG-200 MG-25 MG TABLET
ORAL_TABLET | Freq: Every day | ORAL | 11 refills | 30 days | Status: CP
Start: 2021-09-27 — End: ?

## 2021-10-02 DIAGNOSIS — Z21 Asymptomatic human immunodeficiency virus [HIV] infection status: Principal | ICD-10-CM

## 2021-10-02 MED ORDER — BIKTARVY 50 MG-200 MG-25 MG TABLET
ORAL_TABLET | Freq: Every day | ORAL | 11 refills | 30 days | Status: CP
Start: 2021-10-02 — End: ?

## 2021-10-23 ENCOUNTER — Ambulatory Visit: Admit: 2021-10-23 | Payer: PRIVATE HEALTH INSURANCE

## 2021-10-28 DIAGNOSIS — Z21 Asymptomatic human immunodeficiency virus [HIV] infection status: Principal | ICD-10-CM

## 2021-10-28 MED ORDER — BIKTARVY 50 MG-200 MG-25 MG TABLET
ORAL_TABLET | Freq: Every day | ORAL | 2 refills | 30 days | Status: CP
Start: 2021-10-28 — End: ?

## 2021-10-31 ENCOUNTER — Other Ambulatory Visit: Payer: Self-pay

## 2021-10-31 ENCOUNTER — Encounter: Payer: Self-pay | Admitting: Emergency Medicine

## 2021-10-31 ENCOUNTER — Emergency Department
Admission: EM | Admit: 2021-10-31 | Discharge: 2021-10-31 | Disposition: A | Discharge status: Home / Self Care | Payer: BC Managed Care – PPO | Attending: Emergency Medicine | Admitting: Emergency Medicine

## 2021-10-31 ENCOUNTER — Emergency Department: Payer: BC Managed Care – PPO

## 2021-10-31 DIAGNOSIS — Z87891 Personal history of nicotine dependence: Secondary | ICD-10-CM | POA: Insufficient documentation

## 2021-10-31 DIAGNOSIS — M79622 Pain in left upper arm: Secondary | ICD-10-CM | POA: Diagnosis not present

## 2021-10-31 DIAGNOSIS — R7989 Other specified abnormal findings of blood chemistry: Secondary | ICD-10-CM | POA: Insufficient documentation

## 2021-10-31 DIAGNOSIS — I10 Essential (primary) hypertension: Secondary | ICD-10-CM | POA: Insufficient documentation

## 2021-10-31 DIAGNOSIS — R079 Chest pain, unspecified: Secondary | ICD-10-CM | POA: Insufficient documentation

## 2021-10-31 LAB — COMPREHENSIVE METABOLIC PANEL
ALT: 21 U/L (ref 0–44)
AST: 21 U/L (ref 15–41)
Albumin: 4.1 g/dL (ref 3.5–5.0)
Alkaline Phosphatase: 62 U/L (ref 38–126)
Anion gap: 8 (ref 5–15)
BUN: 11 mg/dL (ref 6–20)
CO2: 27 mmol/L (ref 22–32)
Calcium: 8.9 mg/dL (ref 8.9–10.3)
Chloride: 102 mmol/L (ref 98–111)
Creatinine, Ser: 1.33 mg/dL — ABNORMAL HIGH (ref 0.61–1.24)
GFR, Estimated: >60 mL/min (ref >60)
Glucose, Bld: 144 mg/dL — ABNORMAL HIGH (ref 70–99)
Potassium: 3.5 mmol/L (ref 3.5–5.1)
Sodium: 137 mmol/L (ref 135–145)
Total Bilirubin: 0.3 mg/dL (ref 0.3–1.2)
Total Protein: 7.4 g/dL (ref 6.5–8.1)

## 2021-10-31 LAB — CBC WITH DIFFERENTIAL/PLATELET
Abs Immature Granulocytes: 0.05 10*3/uL (ref 0.00–0.07)
Basophils Absolute: 0.1 10*3/uL (ref 0.0–0.1)
Basophils Relative: 1 %
Eosinophils Absolute: 0.3 10*3/uL (ref 0.0–0.5)
Eosinophils Relative: 3 %
HCT: 41.8 % (ref 39.0–52.0)
Hemoglobin: 13.8 g/dL (ref 13.0–17.0)
Immature Granulocytes: 1 %
Lymphocytes Relative: 45 %
Lymphs Abs: 4.1 10*3/uL — ABNORMAL HIGH (ref 0.7–4.0)
MCH: 31.7 pg (ref 26.0–34.0)
MCHC: 33 g/dL (ref 30.0–36.0)
MCV: 96.1 fL (ref 80.0–100.0)
Monocytes Absolute: 0.6 10*3/uL (ref 0.1–1.0)
Monocytes Relative: 6 %
Neutro Abs: 4 10*3/uL (ref 1.7–7.7)
Neutrophils Relative %: 44 %
Platelets: 203 10*3/uL (ref 150–400)
RBC: 4.35 MIL/uL (ref 4.22–5.81)
RDW: 11.9 % (ref 11.5–15.5)
WBC: 9.2 10*3/uL (ref 4.0–10.5)
nRBC: 0 % (ref 0.0–0.2)

## 2021-10-31 LAB — TROPONIN I (HIGH SENSITIVITY)
Troponin I (High Sensitivity): 3 ng/L (ref <18)
Troponin I (High Sensitivity): 3 ng/L (ref <18)

## 2021-10-31 NOTE — ED Provider Notes (Signed)
----------------------------------------- ?  7:04 AM on 10/31/2021 ?----------------------------------------- ? ?Blood pressure 107/82, pulse 75, temperature 98.5 ?F (36.9 ?C), temperature source Oral, resp. rate 20, height 5\' 11"  (1.803 m), weight 90.7 kg, SpO2 97 %. ? ?Assuming care from Dr. .  In short, Jose George is a 51 y.o. male with a chief complaint of Chest Pain ?54  Refer to the original H&P for additional details. ? ?The current plan of care is to follow-up repeat troponin, if negative then plan for dc home. ? ?----------------------------------------- ?7:34 AM on 10/31/2021 ?----------------------------------------- ?Repeat troponin is within normal limits, patient continues to deny any pain on reassessment.  He is appropriate for discharge home with PCP follow-up, was counseled to return to the ED for new worsening symptoms.  Patient agrees with plan. ? ?  ?11/02/2021, MD ?10/31/21 [phone] ? ?

## 2021-10-31 NOTE — Discharge Instructions (Addendum)
Your blood work, EKG and chest x-ray were all reassuring.  Please follow-up with your primary care provider.  Return to the emergency department if you develop worsening symptoms or new symptoms that are concerning to you. ?

## 2021-10-31 NOTE — ED Notes (Signed)
AVS provided to and discussed with patient. Pt verbalizes understanding of discharge instructions and denies any questions or concerns at this time. Pt ambulated out of department independently with steady gait.  

## 2021-10-31 NOTE — ED Provider Notes (Signed)
? ?[facility] ?Provider Note ? ? ? Event Date/Time  ? First MD Initiated Contact with Patient 10/31/21 [phone]   ?  (approximate) ? ? ?History  ? ?Chest Pain ? ? ?HPI ? ?Jose George is a 51 y.o. male  with pmh HIV, palpitations, hypertension and hyperlipidemia presents with chest and arm pain.  Symptoms have been going on for the past week.  Pain is primarily in the left upper arm does not radiate past the elbow.  He denies any dull aching pain.  Occasionally on the right upper chest.  It is intermittent and can happen at rest, did feel somewhat worse while he was exercising on the treadmill tonight.  Had some shortness of breath earlier in the week but not consistent.  Denies nausea or diaphoresis.  No history of similar.  Pain seems to get better after he takes his propranolol but then comes back an hour later.  Denies history of coronary disease or MI. ?  ? ?Past Medical History:  ?Diagnosis Date  ? HIV (human immunodeficiency virus infection) (HCC)   ? Palpitations   ? ? ?Patient Active Problem List  ? Diagnosis Date Noted  ? Colitis 03/06/2021  ? PVC (premature ventricular contraction) 05/16/2018  ? NSVT (nonsustained ventricular tachycardia) (HCC) 05/11/2018  ? Tobacco use disorder 12/25/2017  ? 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 HIV (human immunodeficiency virus infection) (HCC) 03/23/2013  ? ? ? ?Physical Exam  ?Triage Vital Signs: ?ED Triage Vitals  ?Enc Vitals Group  ?   BP 10/31/21 0416 (!) 142/93  ?   Pulse Rate 10/31/21 0416 84  ?   Resp 10/31/21 0416 17  ?   Temp 10/31/21 0416 98.5 ?F (36.9 ?C)  ?   Temp Source 10/31/21 0416 Oral  ?   SpO2 10/31/21 0416 98 %  ?   Weight 10/31/21 0411 200 lb (90.7 kg)  ?   Height 10/31/21 0411 5\' 11"  (1.803 m)  ?   Head Circumference --   ?   Peak Flow --   ?   Pain Score 10/31/21 0411 2  ?   Pain Loc --   ?   Pain Edu? --   ?   Excl. in GC? --   ? ? ?Most recent vital signs: ?Vitals:  ? 10/31/21 0416 10/31/21 0500  ?BP: (!) 142/93 123/84  ?Pulse: 84  82  ?Resp: 17 19  ?Temp: 98.5 ?F (36.9 ?C)   ?SpO2: 98% 97%  ? ? ? ?General: Awake, no distress.  ?CV:  Good peripheral perfusion.  ?Resp:  Normal effort.  ?Abd:  No distention.  ?Neuro:             Awake, Alert, Oriented x 3  ?Other:  Mild ttp of L upper arm and trap, no deformity, compartment soft, 2+ radial pulses BL ? ? ?ED Results / Procedures / Treatments  ?Labs ?(all labs ordered are listed, but only abnormal results are displayed) ?Labs Reviewed  ?COMPREHENSIVE METABOLIC PANEL - Abnormal; Notable for the following components:  ?    Result Value  ? Glucose, Bld 144 (*)   ? Creatinine, Ser 1.33 (*)   ? All other components within normal limits  ?CBC WITH DIFFERENTIAL/PLATELET - Abnormal; Notable for the following components:  ? Lymphs Abs 4.1 (*)   ? All other components within normal limits  ?TROPONIN I (HIGH SENSITIVITY)  ? ? ? ?EKG ? ?EKG interpretation performed by myself: NSR, nml axis, nml intervals, T wave flattening in  lead III no acute ischemic changes ? ? ? ?RADIOLOGY ?I reviewed the CXR which does not show any acute cardiopulmonary process; agree with radiology report  ? ? ? ?PROCEDURES: ? ?Critical Care performed: No ? ?.1-3 Lead EKG Interpretation ?Performed by: Georga Hacking, MD ?Authorized by: Georga Hacking, MD  ? ?  Interpretation: normal   ?  ECG rate assessment: normal   ?  Ectopy: none   ?  Conduction: normal   ? ?The patient is on the cardiac monitor to evaluate for evidence of arrhythmia and/or significant heart rate changes. ? ? ?MEDICATIONS ORDERED IN ED: ?Medications - No data to display ? ? ?IMPRESSION / MDM / ASSESSMENT AND PLAN / ED COURSE  ?I reviewed the triage vital signs and the nursing notes. ?             ?               ? ?Differential diagnosis includes, but is not limited to, musculoskeletal, cervical radiculopathy, ACS ? ?Patient is a 51 year old male with no cardiac history presents with primarily arm pain with occasional chest pain over the last week.  He had  dyspnea earlier in the week which is resolved.  Arm pain was worse with exertion tonight.  The pain is in the left upper arm associated with some pain in the left trap/neck area.  Pain is improved after taking propranolol.  Patient's vital signs within normal limits.  His EKG is nonischemic.  Overall my suspicion for ACS based on his clinical symptoms is low will obtain troponin and reassess. ? ?Initial troponin is 3.  Patient was exercising around 1:32 AM this morning so will want a repeat which is due for 6 40 AM.  Patient signed out to oncoming provider pending repeat troponin and if negative can be discharged with cardiology follow-up. ? ? ?FINAL CLINICAL IMPRESSION(S) / ED DIAGNOSES  ? ?Final diagnoses:  ?Chest pain, unspecified type  ? ? ? ?Rx / DC Orders  ? ?ED Discharge Orders   ? ? None  ? ?  ? ? ? ?Note:  This document was prepared using Dragon voice recognition software and may include unintentional dictation errors. ?  ?Georga Hacking, MD ?10/31/21 [phone] ? ?

## 2021-10-31 NOTE — ED Triage Notes (Signed)
Patient ambulatory to triage with steady gait, without difficulty or distress noted; pt reports for last wk having left arm and rt sided CP intermittently ?

## 2021-10-31 NOTE — ED Notes (Signed)
MD at the bedside  

## 2021-12-04 ENCOUNTER — Ambulatory Visit: Admit: 2021-12-04 | Discharge: 2021-12-05 | Payer: PRIVATE HEALTH INSURANCE

## 2021-12-04 DIAGNOSIS — E782 Mixed hyperlipidemia: Principal | ICD-10-CM

## 2021-12-04 DIAGNOSIS — R7989 Other specified abnormal findings of blood chemistry: Principal | ICD-10-CM

## 2021-12-04 DIAGNOSIS — F419 Anxiety disorder, unspecified: Principal | ICD-10-CM

## 2021-12-04 DIAGNOSIS — Z21 Asymptomatic human immunodeficiency virus [HIV] infection status: Principal | ICD-10-CM

## 2021-12-04 DIAGNOSIS — K6282 Dysplasia of anus: Principal | ICD-10-CM

## 2021-12-04 MED ORDER — JULUCA 50 MG-25 MG TABLET
ORAL_TABLET | Freq: Every day | ORAL | 11 refills | 0 days | Status: CP
Start: 2021-12-04 — End: ?

## 2021-12-06 DIAGNOSIS — E7800 Pure hypercholesterolemia, unspecified: Principal | ICD-10-CM

## 2021-12-25 MED ORDER — JULUCA 50 MG-25 MG TABLET
ORAL_TABLET | Freq: Every day | ORAL | 11 refills | 0 days | Status: CP
Start: 2021-12-25 — End: ?

## 2022-01-01 DIAGNOSIS — Z21 Asymptomatic human immunodeficiency virus [HIV] infection status: Principal | ICD-10-CM

## 2022-01-01 MED ORDER — BICTEGRAVIR 50 MG-EMTRICITABINE 200 MG-TENOFOVIR ALAFENAM 25 MG TABLET
ORAL_TABLET | Freq: Every day | ORAL | 11 refills | 30 days | Status: CP
Start: 2022-01-01 — End: ?

## 2022-01-05 MED ORDER — ROSUVASTATIN 10 MG TABLET
ORAL_TABLET | Freq: Every evening | ORAL | 3 refills | 90.00000 days | Status: CP
Start: 2022-01-05 — End: 2023-01-05

## 2022-02-04 ENCOUNTER — Encounter: Payer: Self-pay | Admitting: Emergency Medicine

## 2022-02-04 ENCOUNTER — Emergency Department
Admission: EM | Admit: 2022-02-04 | Discharge: 2022-02-04 | Disposition: A | Discharge status: Home / Self Care | Payer: BC Managed Care – PPO | Attending: Emergency Medicine | Admitting: Emergency Medicine

## 2022-02-04 ENCOUNTER — Other Ambulatory Visit: Payer: Self-pay

## 2022-02-04 ENCOUNTER — Telehealth: Admit: 2022-02-04 | Discharge: 2022-02-05 | Payer: PRIVATE HEALTH INSURANCE

## 2022-02-04 DIAGNOSIS — R5383 Other fatigue: Principal | ICD-10-CM

## 2022-02-04 DIAGNOSIS — R531 Weakness: Secondary | ICD-10-CM | POA: Diagnosis not present

## 2022-02-04 DIAGNOSIS — Z20822 Contact with and (suspected) exposure to covid-19: Secondary | ICD-10-CM | POA: Insufficient documentation

## 2022-02-04 DIAGNOSIS — S80262A Insect bite (nonvenomous), left knee, initial encounter: Secondary | ICD-10-CM | POA: Insufficient documentation

## 2022-02-04 DIAGNOSIS — R6883 Chills (without fever): Secondary | ICD-10-CM | POA: Diagnosis not present

## 2022-02-04 DIAGNOSIS — R110 Nausea: Secondary | ICD-10-CM | POA: Insufficient documentation

## 2022-02-04 DIAGNOSIS — W57XXXA Bitten or stung by nonvenomous insect and other nonvenomous arthropods, initial encounter: Secondary | ICD-10-CM | POA: Diagnosis not present

## 2022-02-04 DIAGNOSIS — Z21 Asymptomatic human immunodeficiency virus [HIV] infection status: Secondary | ICD-10-CM | POA: Insufficient documentation

## 2022-02-04 LAB — BASIC METABOLIC PANEL
Anion gap: 8 (ref 5–15)
BUN: 7 mg/dL (ref 6–20)
CO2: 24 mmol/L (ref 22–32)
Calcium: 9.2 mg/dL (ref 8.9–10.3)
Chloride: 107 mmol/L (ref 98–111)
Creatinine, Ser: 1.33 mg/dL — ABNORMAL HIGH (ref 0.61–1.24)
GFR, Estimated: >60 mL/min (ref >60)
Glucose, Bld: 114 mg/dL — ABNORMAL HIGH (ref 70–99)
Potassium: 4 mmol/L (ref 3.5–5.1)
Sodium: 139 mmol/L (ref 135–145)

## 2022-02-04 LAB — CBC WITH DIFFERENTIAL/PLATELET
Abs Immature Granulocytes: 0.03 10*3/uL (ref 0.00–0.07)
Basophils Absolute: 0.1 10*3/uL (ref 0.0–0.1)
Basophils Relative: 1 %
Eosinophils Absolute: 0.3 10*3/uL (ref 0.0–0.5)
Eosinophils Relative: 4 %
HCT: 44.7 % (ref 39.0–52.0)
Hemoglobin: 14.8 g/dL (ref 13.0–17.0)
Immature Granulocytes: 0 %
Lymphocytes Relative: 33 %
Lymphs Abs: 2.9 10*3/uL (ref 0.7–4.0)
MCH: 31.6 pg (ref 26.0–34.0)
MCHC: 33.1 g/dL (ref 30.0–36.0)
MCV: 95.5 fL (ref 80.0–100.0)
Monocytes Absolute: 0.6 10*3/uL (ref 0.1–1.0)
Monocytes Relative: 7 %
Neutro Abs: 5 10*3/uL (ref 1.7–7.7)
Neutrophils Relative %: 55 %
Platelets: 196 10*3/uL (ref 150–400)
RBC: 4.68 MIL/uL (ref 4.22–5.81)
RDW: 11.9 % (ref 11.5–15.5)
WBC: 8.9 10*3/uL (ref 4.0–10.5)
nRBC: 0 % (ref 0.0–0.2)

## 2022-02-04 LAB — RESP PANEL BY RT-PCR (FLU A&B, COVID) ARPGX2
Influenza A by PCR: NEGATIVE
Influenza B by PCR: NEGATIVE
SARS Coronavirus 2 by RT PCR: NEGATIVE

## 2022-02-04 MED ORDER — DOXYCYCLINE HYCLATE 100 MG PO TABS: 100.0000 mg | ORAL_TABLET | Freq: Two times a day (BID) | ORAL | 0 refills | Status: DC

## 2022-02-04 NOTE — ED Provider Notes (Signed)
[facility] Provider Note    Event Date/Time   First MD Initiated Contact with Patient 02/04/22 1139     (approximate)  History   Chief Complaint: Fatigue  HPI  Jose George is a 51 y.o. male with a past medical history of HIV presents to the emergency department with concerns of recent tick bite generalized fatigue and chills.  According to the patient 1 week ago he was bit by tick behind the left knee.  He states he had 2 tablets of doxycycline from a prior illness many years ago he took those 2 tablets and noticed that the bite mark went away and the patient was feeling better however after he ran out of doxycycline he states the bite mark has since returned and now he has noticed some rash/bumps on his upper extremities.  He states for the past week he has been feeling very fatigued with intermittent nausea and chills although has not measured a temperature at any point.  No vomiting no diarrhea.  Patient also states he was exposed to flu although denies any cough or congestion.  Physical Exam   Triage Vital Signs: ED Triage Vitals  Enc Vitals Group     BP 02/04/22 1053 (!) 153/105     Pulse Rate 02/04/22 1053 78     Resp 02/04/22 1053 16     Temp 02/04/22 1053 98.6 F (37 C)     Temp Source 02/04/22 1053 Oral     SpO2 02/04/22 1053 98 %     Weight 02/04/22 1056 200 lb (90.7 kg)     Height 02/04/22 1056 5\' 11"  (1.803 m)     Head Circumference --      Peak Flow --      Pain Score 02/04/22 1128 5     Pain Loc --      Pain Edu? --      Excl. in GC? --     Most recent vital signs: Vitals:   02/04/22 1053  BP: (!) 153/105  Pulse: 78  Resp: 16  Temp: 98.6 F (37 C)  SpO2: 98%    General: Awake, no distress.  CV:  Good peripheral perfusion.  Regular rate and rhythm  Resp:  Normal effort.  Equal breath sounds bilaterally.  Abd:  No distention.  Soft, nontender.  No rebound or guarding. Other:  Small red rash behind the left knee  approximately 1.5 to 1 cm in diameter.  No target lesion.  Patient does have very small red bumps in different places on the upper extremities.  He states they itch.   ED Results / Procedures / Treatments   MEDICATIONS ORDERED IN ED: Medications - No data to display   IMPRESSION / MDM / ASSESSMENT AND PLAN / ED COURSE  I reviewed the triage vital signs and the nursing notes.  Patient's presentation is most consistent with acute presentation with potential threat to life or bodily function.  Patient presents emergency department for 1 week of weakness small red bumps over his extremities and behind the left knee where he was bit by a tick per patient.  Overall the patient appears well reassuring physical exam mild hypertension otherwise reassuring vitals.  We will check basic labs including a COVID/flu test.  Given the patient's recent tick bite now with weakness and chills I believe it is reasonable to cover with doxycycline for possible tickborne illness such as [facility] spotted fever or Lyme disease.  Patient agreeable to plan and  will follow-up with his doctor.  Lab work is reassuring with a negative COVID/flu test.  Chemistry is normal, CBC is normal.  We will discharge on a 10-day course of doxycycline and have the patient follow-up with his doctor.  Patient agreeable to plan of care.  FINAL CLINICAL IMPRESSION(S) / ED DIAGNOSES   Weakness Tick exposure  Rx / DC Orders   Doxycycline twice daily x10 days  Note:  This document was prepared using Dragon voice recognition software and may include unintentional dictation errors.   Minna Antis, MD 02/04/22 1301

## 2022-02-04 NOTE — ED Provider Triage Note (Signed)
Emergency Medicine Provider Triage Evaluation Note  Jose George, a 51 y.o. male  was evaluated in triage.  Pt complains of generalized fatigue and malaise.  Patient reports that within 1 week of generalized malaise and poor energy.  He does admit to an insect bite about a week and a half ago behind his left knee.  He denies blood with a tick from that area but no scratching to the area.  He took 2 leftover doxycycline tablets that he had.  He does also admit to a recent contact with his brother and sister-in-law who are both confirmed to be flu positive.  He denies any nausea, vomiting, chest pain, or shortness of breath.  Review of Systems  Positive: Malaise, fatigue, pruritic rash Negative: FCS  Physical Exam  BP (!) 153/105 (BP Location: Left Arm)   Pulse 78   Temp 98.6 F (37 C) (Oral)   Resp 16   Ht 5\' 11"  (1.803 m)   Wt 90.7 kg   SpO2 98%   BMI 27.89 kg/m  Gen:   Awake, no distress  NAD Resp:  Normal effort CTA MSK:   Moves extremities without difficulty  Other:    Medical Decision Making  Medically screening exam initiated at 11:22 AM.  Appropriate orders placed.  Damyon Mullane was informed that the remainder of the evaluation will be completed by another provider, this initial triage assessment does not replace that evaluation, and the importance of remaining in the ED until their evaluation is complete.  Patient to the ED for concern for possible tick bite versus flu exposure.  He reports 1 of week and a half generalized malaise and fatigue.   Lawrence Santiago, PA-C 02/04/22 1124

## 2022-02-04 NOTE — ED Notes (Signed)
See triage note  Presents with some chills  shakes and fatigue  Afebrile on arrival   Thinks he may have been bitten by an tick

## 2022-02-04 NOTE — ED Triage Notes (Signed)
Says possible tick bite and now withe shakning, cant sleep, fatigue.

## 2022-02-04 NOTE — ED Triage Notes (Signed)
Patient reports tick bite to back of left leg from a few weeks ago. Patient also states recent exposure to flu from family member. C/o fatigue, rash and trouble sleeping over the past week.

## 2022-03-20 ENCOUNTER — Institutional Professional Consult (permissible substitution): Admit: 2022-03-20 | Discharge: 2022-03-21 | Payer: PRIVATE HEALTH INSURANCE

## 2022-04-23 ENCOUNTER — Telehealth: Admit: 2022-04-23 | Discharge: 2022-04-24 | Payer: PRIVATE HEALTH INSURANCE

## 2022-04-23 DIAGNOSIS — J329 Chronic sinusitis, unspecified: Principal | ICD-10-CM

## 2022-04-23 DIAGNOSIS — B20 Human immunodeficiency virus [HIV] disease: Principal | ICD-10-CM

## 2022-04-23 DIAGNOSIS — R000 Tachycardia, unspecified: Principal | ICD-10-CM

## 2022-04-23 DIAGNOSIS — F419 Anxiety disorder, unspecified: Principal | ICD-10-CM

## 2022-04-23 DIAGNOSIS — R030 Elevated blood-pressure reading, without diagnosis of hypertension: Principal | ICD-10-CM

## 2022-04-23 DIAGNOSIS — Z1211 Encounter for screening for malignant neoplasm of colon: Principal | ICD-10-CM

## 2022-04-23 DIAGNOSIS — E7800 Pure hypercholesterolemia, unspecified: Principal | ICD-10-CM

## 2022-04-23 DIAGNOSIS — F17200 Nicotine dependence, unspecified, uncomplicated: Principal | ICD-10-CM

## 2022-04-23 MED ORDER — PROPRANOLOL 10 MG TABLET
ORAL_TABLET | Freq: Two times a day (BID) | ORAL | 1 refills | 30 days | Status: CP | PRN
Start: 2022-04-23 — End: 2023-04-23

## 2022-06-04 ENCOUNTER — Ambulatory Visit: Admit: 2022-06-04 | Discharge: 2022-06-05 | Payer: PRIVATE HEALTH INSURANCE

## 2022-06-04 DIAGNOSIS — Z1159 Encounter for screening for other viral diseases: Principal | ICD-10-CM

## 2022-06-04 DIAGNOSIS — Z5181 Encounter for therapeutic drug level monitoring: Principal | ICD-10-CM

## 2022-06-04 DIAGNOSIS — B20 Human immunodeficiency virus [HIV] disease: Principal | ICD-10-CM

## 2022-06-04 DIAGNOSIS — Z113 Encounter for screening for infections with a predominantly sexual mode of transmission: Principal | ICD-10-CM

## 2022-06-04 DIAGNOSIS — Z23 Encounter for immunization: Principal | ICD-10-CM

## 2022-06-04 DIAGNOSIS — Z79899 Other long term (current) drug therapy: Principal | ICD-10-CM

## 2022-06-04 DIAGNOSIS — Z720 Tobacco use: Principal | ICD-10-CM

## 2022-06-04 DIAGNOSIS — Z9189 Other specified personal risk factors, not elsewhere classified: Principal | ICD-10-CM

## 2022-06-05 DIAGNOSIS — Z720 Tobacco use: Principal | ICD-10-CM

## 2022-07-08 ENCOUNTER — Telehealth: Payer: Self-pay | Admitting: Licensed Clinical Social Worker

## 2022-07-08 NOTE — Telephone Encounter (Signed)
Patient returned call and lvm. LCSW called back and spoke with patient about virtual services. Patient to sign [facility] consent and call to schedule an appointment.

## 2022-07-08 NOTE — Telephone Encounter (Signed)
Patient lvm for LCSW to return call. LCSW attempted to return call and lvm.

## 2022-07-15 ENCOUNTER — Ambulatory Visit: Payer: Self-pay | Admitting: Licensed Clinical Social Worker

## 2022-07-15 DIAGNOSIS — F411 Generalized anxiety disorder: Secondary | ICD-10-CM | POA: Insufficient documentation

## 2022-07-15 NOTE — Progress Notes (Signed)
Counselor Initial Adult Exam  Name: Jose George Date: 07/15/2022 MRN: [id number] DOB: [birth year] PCP: Durward Parcel, MD  Time spent: 62 minutes   A biopsychosocial was completed on the Patient. Background information and current concerns were obtained during an intake on Zoom with the [facility] Department clinician, Milton Ferguson, LCSW. Reviewed profession disclosure, contact information and confidentiality was discussed and appropriate consents were signed.     Reason for Visit /Presenting Problem: Patient presents with a problem over the last 4 years of having anxiety and panic sensations/panic attacks that he reports that he has been seen in the emergency department several times for because he had concerns about it being a heart attack, no heart issues were found and were also ruled out. He reports that he has not been dealing with feelings, he bottles his feelings and sets them aside and he believes this is what is leading to  these experiences- which he now believes are actually anxiety and panic due to unresolved stress and not dealing with them. He reports he is now on propanol and shares that it has been helpful.Patient reports that he has experienced a lot of loss in his life and within the last year. He reports that he went through a break up after a 10 year relationship just before Thanksgiving, lost his best friend in Jan 16, 2023- found him deceased from an accidental overdose, as well as other losses. Patient reports that he had a Pretty good, great childhood and both of his parents have passed. He does report experiencing bullying in middle school. Patient denies history of mental health diagnosis but does report he may have some ADD/ADHD symptoms and struggles with focusing on things that he is not interested in. He currently endorsees anxiety symptoms GAD-7 = 14, and reports muscle tension, sleep disturbance, and some "temper" issues that he reports he manages well.   Mental  Status Exam:    Appearance:   Casual     Behavior:  Appropriate, Sharing, and Motivated  Motor:  Normal  Speech/Language:   Clear and Coherent and Normal Rate  Affect:  Appropriate, Congruent, and Full Range  Mood:  normal  Thought process:  normal  Thought content:    WNL  Sensory/Perceptual disturbances:    WNL  Orientation:  oriented to person, place, time/date, situation, and day of week  Attention:  Good  Concentration:  Good  Memory:  WNL  Fund of knowledge:   Good  Insight:    Fair  Judgment:   Fair  Impulse Control:  Fair   Reported Symptoms:  Panic attacks, Sleep disturbance, and Anxiety, worries, muscle tension  Risk Assessment: Danger to Self:  No Self-injurious Behavior: No Danger to Others: No Duty to Warn:no Physical Aggression / Violence:No  Access to Firearms a concern: No  Gang Involvement:No  Patient / guardian was educated about steps to take if suicide or homicide risk level increases between visits: yes While future psychiatric events cannot be accurately predicted, the patient does not currently require acute inpatient psychiatric care and does not currently meet [facility] involuntary commitment criteria.  Substance Abuse History: Current substance abuse: No     Past Psychiatric History:   No previous psychological problems have been observed and no family history of mental health diagnosis reported  Outpatient Providers:[facility]  History of Psych Hospitalization: No   Abuse History: Victim of No.,  Report needed: No. Victim of Neglect:No. Perpetrator of  NA   Witness / Exposure to Domestic Violence:  No   Protective Services Involvement: No  Witness to [employer] Violence:  No   Family History:  Family History  Problem Relation Age of Onset   CAD Mother    Social History:  Social History   Socioeconomic History   Marital status: Single    Spouse name: Not on file   Number of children: Not on file   Years of education: Not  on file   Highest education level: Not on file  Occupational History   Not on file  Tobacco Use   Smoking status: Every Day    Packs/day: 1.00    Years: 30.00    Total pack years: 30.00    Types: Cigarettes   Smokeless tobacco: Never  Vaping Use   Vaping Use: Never used  Substance and Sexual Activity   Alcohol use: No   Drug use: Never   Sexual activity: Yes    Partners: Male    Birth control/protection: None  Other Topics Concern   Not on file  Social History Narrative   Not on file   Social Determinants of Health   Financial Resource Strain: Not on file  Food Insecurity: Not on file  Transportation Needs: Not on file  Physical Activity: Not on file  Stress: Not on file  Social Connections: Not on file   Living situation: the patient lives alone  Sexual Orientation:  Gay  Relationship Status: single  Name of spouse / other:NA             If a parent, number of children / ages:No children   Support Systems; not much support. Talks with one sister a lot, very little friends, patient feels like he doesn't have much of a support system   Financial Stress:  No   Income/Employment/Disability: Employment loves his job, been there about a 1.5 years   Financial planner: No   Educational History: Education: high school diploma/GED  Religion/Sprituality/World View:    Spiritual   Any cultural differences that may affect / interfere with treatment:  not applicable   Recreation/Hobbies: gardening, getting outside, tthrifting, enjoys cars    Stressors:Other: Anxiety, panic, stress    Strengths:  Some relationships, honesty  Barriers:  None noted    Legal History: Pending legal issue / charges: The patient has no significant history of legal issues. History of legal issue / charges:  NA  Medical History/Surgical History:reviewed Past Medical History:  Diagnosis Date   HIV (human immunodeficiency virus infection) (HCC)    Palpitations    No past surgical  history on file.  Medications: Current Outpatient Medications  Medication Sig Dispense Refill   bictegravir-emtricitabine-tenofovir AF (BIKTARVY) 50-200-25 MG TABS tablet Take by mouth.     doxycycline (VIBRA-TABS) 100 MG tablet Take 1 tablet (100 mg total) by mouth 2 (two) times daily. 20 tablet 0   No current facility-administered medications for this visit.   No Known Allergies  Yvan Dority is a 52 y.o. year old male with no reported history of mental health diagnosis. Patient currently presents with anxiety and panic symptoms that he reports he has experienced for about 4 years. He further reports that heart issues have been ruled out by medical providers at least two times. Patient currently describes anxiety symptoms GAD-7 = 14, and also describes sleep disturbance and muscle tension as well as panic sensations, including heart palpitations, chest tightness. Patient reports that these symptoms impact his functioning in multiple life domains.   Due to the above symptoms and patient's reported  history, patient is diagnosed with Generalized Anxiety Disorder, With panic attacks. Continued mental health treatment is needed to address patient's symptoms and monitor his safety and stability. Patient is recommended for continued outpatient therapy to reduce his symptoms and improve his coping strategies.    There is no acute risk for suicide or violence at this time.  While future psychiatric events cannot be accurately predicted, the patient does not require acute inpatient psychiatric care and does not currently meet [facility] involuntary commitment criteria.  Diagnoses:    ICD-10-CM   1. Generalized anxiety disorder  F41.1    With panic attacks / sensations      Plan of Care:  Patient's goal of treatment is dealing with emotions, feelings, past problems, and going forward and processing these experiences to deal with them in a rational way.   @ next session - brief  psychoeducation and a rational for use of CBT's.  -LCSW and patient agreed to develop a treatment plan at next session.     Future Appointments  Date Time Provider Department Center  07/22/2022  2:00 PM Kathreen Cosier, LCSW [facility] None    Kathreen Cosier, [place]

## 2022-07-22 ENCOUNTER — Ambulatory Visit: Payer: Self-pay | Admitting: Licensed Clinical Social Worker

## 2022-07-22 DIAGNOSIS — F411 Generalized anxiety disorder: Secondary | ICD-10-CM

## 2022-07-22 NOTE — Progress Notes (Signed)
Counselor/Therapist Progress Note  Patient ID: Jose George, MRN: [id number],    Date: 07/22/2022  Time Spent: 54 minutes    Treatment Type: Psychotherapy  Reported Symptoms:  Anxiety, worry, anxious thoughts  Mental Status Exam:  Appearance:   Casual and Neat     Behavior:  Appropriate, Sharing, Motivated, and Assertive  Motor:  Normal  Speech/Language:   Clear and Coherent and Normal Rate  Affect:  Appropriate, Congruent, and Full Range  Mood:  normal  Thought process:  normal  Thought content:    WNL  Sensory/Perceptual disturbances:    WNL  Orientation:  oriented to person, place, time/date, situation, and day of week  Attention:  Good  Concentration:  Good  Memory:  WNL  Fund of knowledge:   Good  Insight:    Fair  Judgment:   Fair  Impulse Control:  Fair   Risk Assessment: Danger to Self:  No Self-injurious Behavior: No Danger to Others: No Duty to Warn:no Physical Aggression / Violence:No  Access to Firearms a concern: No  Gang Involvement:No   Subjective: Patient was engaged and cooperative throughout the session using time effectively to discuss thoughts, feelings, goal of treatment and treatment plan. Patient voices continued motivation for treatment and understanding of CBTs. Patient is likely to benefit from future treatment because he is motivated to decrease symptoms.   Interventions: Cognitive Behavioral Therapy Established psychological safety.  Checked in with patient and reviewed previous session, including assessment and goal of treatment. Provided psychoeducation on  CBTs. Continued to develop rapport and understanding og patient's goal of treatment and worked collaboratively to develop CBTs treatment plan. Taught patient about holding ice and ice on forehead to use when having panic sensations. Encouraged patient to be seen by PCP to diagnose panic attacks although no medical diagnoses has been established suggesting his symptoms are not panic.  Provided support through active listening, validation of feelings, and highlighted patient's strengths.   Diagnosis:   ICD-10-CM   1. Generalized anxiety disorder  F41.1      Plan: Patient's goal of treatment is dealing with emotions, feelings, past problems, and going forward and processing these experiences to deal with them in a rational way.   Treatment Target: Understand the relationship between thoughts, emotions, and behaviors  Psychoeducation on CBTs model   Oriented the client to the therapeutic approach Teach the connection between thoughts, emotions, and behaviors  Enhance emotional awareness and discrimination of emotions  Understand thoughts, emotions, and behaviors  Treatment Target: Increase realistic balanced thinking -to learn how to replace thinking with thoughts that are more accurate or helpful Explore patient's thoughts, beliefs, automatic thoughts, assumptions  Identify and replace unhelpful thinking patterns (upsetting ideas, self-talk and mental images) Process distress and allow for emotional release  Questioning and challenging thoughts Cognitive reappraisal  Restructuring, Socratic questioning  Provided psychoeducation on core beliefs, explore, and assist patient in identifying core beliefs and modify underlying beliefs Treatment Target: Creating a life worth living  Values clarification   Self-care - nutrition, sleep, exercise  Mindfulness skills Interpersonal Effectiveness  Emotion Regulation Distress Tolerance Treatment Target: Increase coping skills to manage panic and panic attacks  Intentional breathing exercises 5 senses Grounding exercises - holding ice cube, ice on forehead, washing hands in cold water  Future Appointments  Date Time Provider [place]  07/29/2022  2:00 PM Milton Ferguson, LCSW [facility] None    Milton Ferguson, LCSW

## 2022-07-29 ENCOUNTER — Ambulatory Visit: Payer: Self-pay | Admitting: Licensed Clinical Social Worker

## 2022-07-29 DIAGNOSIS — F411 Generalized anxiety disorder: Secondary | ICD-10-CM

## 2022-07-29 NOTE — Progress Notes (Signed)
Counselor/Therapist Progress Note  Patient ID: Jose George, MRN: [id number],    Date: 07/29/2022  Time Spent: 45 minutes    Treatment Type: Psychotherapy  Reported Symptoms: Panic attacks and anxiety, panic symptoms, sleep disturbance   Mental Status Exam:  Appearance:   Casual     Behavior:  Appropriate, Sharing, and Motivated  Motor:  Normal  Speech/Language:   Clear and Coherent and Normal Rate  Affect:  Appropriate and Congruent  Mood:  normal  Thought process:  normal  Thought content:    WNL  Sensory/Perceptual disturbances:    WNL  Orientation:  oriented to person, place, time/date, situation, and day of week  Attention:  Good  Concentration:  Good  Memory:  WNL  Fund of knowledge:   Good  Insight:    Fair  Judgment:   Fair  Impulse Control:  Fair   Risk Assessment: Danger to Self:  No Self-injurious Behavior: No Danger to Others: No Duty to Warn:no Physical Aggression / Violence:No  Access to Firearms a concern: No  Gang Involvement:No   Subjective: Patient was engaged and cooperative throughout the session using time effectively to discuss thoughts and feelings. Patient was receptive to feedback and intervention from LCSW. Patient voices continued motivation for treatment. Patient is likely to benefit from future treatment because they remain motivated to decrease symptoms.       Interventions: Cognitive Behavioral Therapy and Client Centered Established psychological safety.Checked in with patient regarding their week. Clinician met with patient to identify needs related to stressors and functioning, and assess and monitor for signs and symptoms of anxiety and depression, and assess safety. The clinician processed with the patient how they have been doing since the last follow-up session. LCSW conducted a chain analysis of anxiety and panic. Discussed sleep hygiene, recent loss, recent breakup of 10 years relationship. Provided supportive space for patient to  process their thoughts and emotions about what they have experienced related to recent breakup. Taught patient about emotions, holding space for emotions. Provided support through active listening, validation of feelings, and highlighted patient's strengths.   Diagnosis:   ICD-10-CM   1. Generalized anxiety disorder  F41.1      Plan: Patient's goal of treatment is dealing with emotions, feelings, past problems, and going forward and processing these experiences to deal with them in a rational way.    Treatment Target: Understand the relationship between thoughts, emotions, and behaviors  Psychoeducation on CBTs model   Oriented the client to the therapeutic approach Teach the connection between thoughts, emotions, and behaviors  Enhance emotional awareness and discrimination of emotions  Understand thoughts, emotions, and behaviors  Treatment Target: Increase realistic balanced thinking -to learn how to replace thinking with thoughts that are more accurate or helpful Explore patient's thoughts, beliefs, automatic thoughts, assumptions  Identify and replace unhelpful thinking patterns (upsetting ideas, self-talk and mental images) Process distress and allow for emotional release  Questioning and challenging thoughts Cognitive reappraisal  Restructuring, Socratic questioning  Provided psychoeducation on core beliefs, explore, and assist patient in identifying core beliefs and modify underlying beliefs Treatment Target: Creating a life worth living  Values clarification   Self-care - nutrition, sleep, exercise  Mindfulness skills Interpersonal Effectiveness  Emotion Regulation Distress Tolerance Treatment Target: Increase coping skills to manage panic and panic attacks  Intentional breathing exercises 5 senses Grounding exercises - holding ice cube, ice on forehead, washing hands in cold water  Future Appointments  Date Time Provider [place]  08/05/2022  2:00 PM  Milton Ferguson, LCSW [facility] None    Milton Ferguson, [place]

## 2022-08-04 ENCOUNTER — Ambulatory Visit: Admit: 2022-08-04 | Discharge: 2022-08-05 | Payer: PRIVATE HEALTH INSURANCE

## 2022-08-05 ENCOUNTER — Ambulatory Visit: Payer: Self-pay | Admitting: Licensed Clinical Social Worker

## 2022-08-05 DIAGNOSIS — F411 Generalized anxiety disorder: Secondary | ICD-10-CM

## 2022-08-05 NOTE — Progress Notes (Signed)
Counselor/Therapist Progress Note  Patient ID: Jose George, MRN: [id number],    Date: 08/05/2022  Time Spent: 45 minutes    Treatment Type: Psychotherapy  Reported Symptoms:  Anxiety, anxious thoughts; low mood  Mental Status Exam:  Appearance:   Casual and Neat     Behavior:  Appropriate, Sharing, and Motivated  Motor:  Normal  Speech/Language:   Clear and Coherent and Normal Rate  Affect:  Appropriate, Congruent, and Full Range  Mood:  normal  Thought process:  normal  Thought content:    WNL  Sensory/Perceptual disturbances:    WNL  Orientation:  oriented to person, place, time/date, situation, and day of week  Attention:  Good  Concentration:  Good  Memory:  WNL  Fund of knowledge:   Good  Insight:    Fair  Judgment:   Fair  Impulse Control:  Fair   Risk Assessment: Danger to Self:  No Self-injurious Behavior: No Danger to Others: No Duty to Warn:no Physical Aggression / Violence:No  Access to Firearms a concern: No  Gang Involvement:No   Subjective: Patient was receptive to feedback and intervention from LCSW and actively and effectively participated throughout the session. Patient is likely to benefit from future treatment because he remains motivated to decrease symptoms of anxiety and depressed mood. Patient remains motivated to decrease symptoms and improve functioning.     Interventions: Cognitive Behavioral Therapy and Client Centered  Established psychological safety. Checked in with patient regarding their week. Clinician met with patient to identify needs related to stressors and functioning, and assess and monitor for signs and symptoms of depressed mood and anxiety and assess safety. LCSW assisted patient in processing their thoughts and emotions related to challenges intimate partner relationships. LCSW reviewed noticing thoughts, judgements, and taught radical acceptance. Provided support through active listening, validation of feelings, and  highlighted patient's strengths.   Diagnosis:   ICD-10-CM   1. Generalized anxiety disorder  F41.1      Plan:  Patient's goal of treatment is dealing with emotions, feelings, past problems, and going forward and processing these experiences to deal with them in a rational way.    Treatment Target: Understand the relationship between thoughts, emotions, and behaviors  Psychoeducation on CBTs model   Oriented the client to the therapeutic approach Teach the connection between thoughts, emotions, and behaviors  Enhance emotional awareness and discrimination of emotions  Understand thoughts, emotions, and behaviors  Treatment Target: Increase realistic balanced thinking -to learn how to replace thinking with thoughts that are more accurate or helpful Explore patient's thoughts, beliefs, automatic thoughts, assumptions  Identify and replace unhelpful thinking patterns (upsetting ideas, self-talk and mental images) Process distress and allow for emotional release  Questioning and challenging thoughts Cognitive reappraisal  Restructuring, Socratic questioning  Provided psychoeducation on core beliefs, explore, and assist patient in identifying core beliefs and modify underlying beliefs Treatment Target: Creating a life worth living  Values clarification   Self-care - nutrition, sleep, exercise  Mindfulness skills Interpersonal Effectiveness  Emotion Regulation Distress Tolerance Treatment Target: Increase coping skills to manage panic and panic attacks  Intentional breathing exercises 5 senses Grounding exercises - holding ice cube, ice on forehead, washing hands in cold water  Future Appointments  Date Time Provider [place]  08/19/2022  3:00 PM Milton Ferguson, LCSW [facility] None    Milton Ferguson, LCSW

## 2022-08-19 ENCOUNTER — Ambulatory Visit: Payer: Self-pay | Admitting: Licensed Clinical Social Worker

## 2022-08-19 DIAGNOSIS — F411 Generalized anxiety disorder: Secondary | ICD-10-CM

## 2022-08-19 NOTE — Progress Notes (Signed)
Counselor/Therapist Progress Note  Patient ID: Jose George, MRN: [id number],    Date: 08/19/2022  Time Spent: 48 minutes    Treatment Type: Psychotherapy  Reported Symptoms:  overall good; moments of anxiety and panic symptoms a few times over the past two weeks  Mental Status Exam:  Appearance:   Casual and Neat     Behavior:  Appropriate, Sharing, and Motivated  Motor:  Normal  Speech/Language:   Clear and Coherent and Normal Rate  Affect:  Appropriate, Congruent, and Full Range  Mood:  normal  Thought process:  normal  Thought content:    WNL  Sensory/Perceptual disturbances:    WNL  Orientation:  oriented to person, place, time/date, situation, and day of week  Attention:  Good  Concentration:  Good  Memory:  WNL  Fund of knowledge:   Good  Insight:    Fair  Judgment:   Fair  Impulse Control:  Fair   Risk Assessment: Danger to Self:  No Self-injurious Behavior: No Danger to Others: No Duty to Warn:no Physical Aggression / Violence:No  Access to Firearms a concern: No  Gang Involvement:No   Subjective: Patient was engaged and cooperative throughout the session using time effectively to discuss thoughts and feelings. Patient was resistant to mindfulness meditation but was willing to receive links to guided meditation. Patient voices he has been overall good with a few panic episodes. Patient is likely to benefit from future treatment because they remain motivated to decrease anxiety and reports benefit of regular sessions.       Interventions: Cognitive Behavioral Therapy and Client Centered  Established psychological safety. Checked in with patient regarding their week. Explored patient's panic symptoms and reviewed what coping skills patient is using to mange these symptoms. LCSW shared information about mindfulness meditation. LCSW explored patient's unresolved issues that he feels are contributing to his panic attacks; shared information about addressing these  unresolved issues and explored his readiness for this; encouraged patient to make a list of unresolved issues that pop up for him most. Provided support through active listening, validation of feelings, and highlighted patient's strengths.   Diagnosis:   ICD-10-CM   1. Generalized anxiety disorder  F41.1      Plan: Patient's goal of treatment is dealing with emotions, feelings, past problems, and going forward and processing these experiences to deal with them in a rational way.    Treatment Target: Understand the relationship between thoughts, emotions, and behaviors  Psychoeducation on CBTs model   Oriented the client to the therapeutic approach Teach the connection between thoughts, emotions, and behaviors  Enhance emotional awareness and discrimination of emotions  Understand thoughts, emotions, and behaviors  Treatment Target: Increase realistic balanced thinking -to learn how to replace thinking with thoughts that are more accurate or helpful Explore patient's thoughts, beliefs, automatic thoughts, assumptions  Identify and replace unhelpful thinking patterns (upsetting ideas, self-talk and mental images) Process distress and allow for emotional release  Questioning and challenging thoughts Cognitive reappraisal  Restructuring, Socratic questioning  Provided psychoeducation on core beliefs, explore, and assist patient in identifying core beliefs and modify underlying beliefs Treatment Target: Creating a life worth living  Values clarification   Self-care - nutrition, sleep, exercise  Mindfulness skills Interpersonal Effectiveness  Emotion Regulation Distress Tolerance Treatment Target: Increase coping skills to manage panic and panic attacks  Intentional breathing exercises 5 senses Grounding exercises - holding ice cube, ice on forehead, washing hands in cold water  Future Appointments  Date Time Provider [place]  08/26/2022  3:00 PM Milton Ferguson, LCSW [facility]  None     Milton Ferguson, LCSW

## 2022-08-26 ENCOUNTER — Ambulatory Visit: Payer: Self-pay | Admitting: Licensed Clinical Social Worker

## 2022-08-26 DIAGNOSIS — F411 Generalized anxiety disorder: Secondary | ICD-10-CM

## 2022-08-26 NOTE — Progress Notes (Signed)
Counselor/Therapist Progress Note  Patient ID: Mantas Donia, MRN: [id number],    Date: 08/26/2022  Time Spent: 45 minutes    Treatment Type: Psychotherapy  Reported Symptoms: Panic attacks and anxiety, possible OCD symptoms around panic /anxiety symptoms   Mental Status Exam:  Appearance:   Casual and Neat     Behavior:  Appropriate, Sharing, Resistant, and Motivated  Motor:  Normal  Speech/Language:   Clear and Coherent and Normal Rate  Affect:  Appropriate, Congruent, and Full Range  Mood:  normal  Thought process:  normal  Thought content:    WNL  Sensory/Perceptual disturbances:    WNL  Orientation:  oriented to person, place, time/date, and situation  Attention:  Good  Concentration:  Good  Memory:  WNL  Fund of knowledge:   Good  Insight:    Fair  Judgment:   Fair  Impulse Control:  Fair   Risk Assessment: Danger to Self:  No Self-injurious Behavior: No Danger to Others: No Duty to Warn:no Physical Aggression / Violence:No  Access to Firearms a concern: No  Gang Involvement:No   Subjective: Patient was engaged throughout the session using time effectively to discuss thoughts and feelings. Patient has some resistance to feedback and intervention from LCSW. Patient voices continued motivation for treatment and reports benefit of regular sessions.      Interventions: Cognitive Behavioral Therapy and client centered  Established psychological safety. Checked in with patient regarding their experiences of this past week. Discussed pushing away emotions/or discriminating emotions, taught patient about allowing space for emotions and encouraged him to do so. LCSW assisted patient in processing their thoughts and emotions about their challenges with lack of close relationships and intimate partner relationships.Provided support through active listening, validation of feelings, and highlighted patient's strengths.   Diagnosis:   ICD-10-CM   1. Generalized anxiety  disorder  F41.1      Plan:  Patient's goal of treatment is dealing with emotions, feelings, past problems, and going forward and processing these experiences to deal with them in a rational way.    Treatment Target: Understand the relationship between thoughts, emotions, and behaviors  Psychoeducation on CBTs model   Oriented the client to the therapeutic approach Teach the connection between thoughts, emotions, and behaviors  Enhance emotional awareness and discrimination of emotions  Understand thoughts, emotions, and behaviors  Treatment Target: Increase realistic balanced thinking -to learn how to replace thinking with thoughts that are more accurate or helpful Explore patient's thoughts, beliefs, automatic thoughts, assumptions  Identify and replace unhelpful thinking patterns (upsetting ideas, self-talk and mental images) Process distress and allow for emotional release  Questioning and challenging thoughts Cognitive reappraisal  Restructuring, Socratic questioning  Provided psychoeducation on core beliefs, explore, and assist patient in identifying core beliefs and modify underlying beliefs Treatment Target: Creating a life worth living  Values clarification   Self-care - nutrition, sleep, exercise  Mindfulness skills Interpersonal Effectiveness  Emotion Regulation Distress Tolerance Treatment Target: Increase coping skills to manage panic and panic attacks  Intentional breathing exercises 5 senses Grounding exercises - holding ice cube, ice on forehead, washing hands in cold water  Future Appointments  Date Time Provider [place]  09/02/2022  3:00 PM Milton Ferguson, LCSW [facility] None    Milton Ferguson, LCSW

## 2022-09-02 ENCOUNTER — Ambulatory Visit: Payer: Self-pay | Admitting: Licensed Clinical Social Worker

## 2022-09-02 DIAGNOSIS — F411 Generalized anxiety disorder: Secondary | ICD-10-CM

## 2022-09-02 NOTE — Progress Notes (Signed)
Counselor/Therapist Progress Note  Patient ID: Jose George, MRN: [id number],    Date: 09/02/2022  Time Spent: 55 minutes    Treatment Type: Psychotherapy  Reported Symptoms: Obsessive thinking and no panic attacks in the past week, anxiety, anxious thoughts   Mental Status Exam:  Appearance:   Casual and Neat     Behavior:  Appropriate, Sharing, and Motivated  Motor:  Normal  Speech/Language:   Clear and Coherent and Normal Rate  Affect:  Appropriate, Congruent, and Full Range  Mood:  normal  Thought process:  normal  Thought content:    WNL  Sensory/Perceptual disturbances:    WNL  Orientation:  oriented to person, place, time/date, situation, and day of week  Attention:  Good  Concentration:  Good  Memory:  WNL  Fund of knowledge:   Good  Insight:    Fair  Judgment:   Fair  Impulse Control:  Fair   Risk Assessment: Danger to Self:  No Self-injurious Behavior: No Danger to Others: No Duty to Warn:no Physical Aggression / Violence:No  Access to Firearms a concern: No  Gang Involvement:No   Subjective: Patient was engaged and cooperative throughout the session using time effectively to discuss thoughts, feelings and symptoms of possible OCD.  Patient voices continued motivation for treatment and understanding that LCSW recommends he be evaluated for medication management for OCD/anxiety symptoms.   Interventions: Cognitive Behavioral Therapy and Client centered  Established psychological safety. Checked in with patient regarding their week. The clinician processed with the patient how they have been doing since the last follow-up session. LCSW implemented the [employer] Obsessive Compulsive Scale to further assess patient's possible OCD symptoms related to health. Client scores Obsessions = 11 and compulsive = 8. LCSW recommends continued evaluation and encouraged patient to be evaluated for medication management. LCSW also reviewed the importance of patient engaging  with others and in meaningful activities.  Provided support through active listening, validation of feelings, and highlighted patient's strengths.   Diagnosis:   ICD-10-CM   1. Generalized anxiety disorder  F41.1      Plan:  Patient's goal of treatment is dealing with emotions, feelings, past problems, and going forward and processing these experiences to deal with them in a rational way.    Treatment Target: Understand the relationship between thoughts, emotions, and behaviors  Psychoeducation on CBTs model   Oriented the client to the therapeutic approach Teach the connection between thoughts, emotions, and behaviors  Enhance emotional awareness and discrimination of emotions  Understand thoughts, emotions, and behaviors  Treatment Target: Increase realistic balanced thinking -to learn how to replace thinking with thoughts that are more accurate or helpful Explore patient's thoughts, beliefs, automatic thoughts, assumptions  Identify and replace unhelpful thinking patterns (upsetting ideas, self-talk and mental images) Process distress and allow for emotional release  Questioning and challenging thoughts Cognitive reappraisal  Restructuring, Socratic questioning  Provided psychoeducation on core beliefs, explore, and assist patient in identifying core beliefs and modify underlying beliefs Treatment Target: Creating a life worth living  Values clarification   Self-care - nutrition, sleep, exercise  Mindfulness skills Interpersonal Effectiveness  Emotion Regulation Distress Tolerance Treatment Target: Increase coping skills to manage panic and panic attacks  Intentional breathing exercises 5 senses Grounding exercises - holding ice cube, ice on forehead, washing hands in cold water   Future Appointments  Date Time Provider [place]  09/09/2022  3:00 PM Milton Ferguson, LCSW [facility] None     Milton Ferguson, LCSW

## 2022-09-09 ENCOUNTER — Ambulatory Visit: Payer: Self-pay | Admitting: Licensed Clinical Social Worker

## 2022-09-09 DIAGNOSIS — F411 Generalized anxiety disorder: Secondary | ICD-10-CM

## 2022-09-09 NOTE — Progress Notes (Signed)
Counselor/Therapist Progress Note  Patient ID: Jose George, MRN: [id number],    Date: 09/09/2022  Time Spent: 48 minutes    Treatment Type: Psychotherapy  Reported Symptoms:  "uneventful" no panic episodes; mood has been "good"  Mental Status Exam:  Appearance:   Casual and Well Groomed     Behavior:  Appropriate, Sharing, and Motivated  Motor:  Normal  Speech/Language:   Clear and Coherent and Normal Rate  Affect:  Appropriate, Congruent, and Full Range  Mood:  normal  Thought process:  normal  Thought content:    WNL  Sensory/Perceptual disturbances:    WNL  Orientation:  oriented to person, place, time/date, situation, and day of week  Attention:  Good  Concentration:  Good  Memory:  WNL  Fund of knowledge:   Good  Insight:    Fair  Judgment:   Fair  Impulse Control:  Fair   Risk Assessment: Danger to Self:  No Self-injurious Behavior: No Danger to Others: No Duty to Warn:no Physical Aggression / Violence:No  Access to Firearms a concern: No  Gang Involvement:No   Subjective: Patient was engaged and cooperative throughout the session using time effectively to discuss thoughts and feelings. Patient reports that he engaged in activities, including the gym and spending with with his sister which he reports has "helped out". Patient was receptive to feedback and intervention from LCSW. Patient voices continued motivation for treatment.   Interventions: Cognitive Behavioral Therapy and Client centered  Established psychological safety. Checked in with patient regarding their week. Brief assessment of symptoms of anxiety and depression. The clinician processed with the patient how they have been doing since the last follow-up session. LCSW assisted patient in processing their thoughts and emotions related to recent breakup, grief and lack of closure. LCSW explored patient's desires related to intimate partner relationships/ identifying values and barriers. Provided support  through active listening, validation of feelings, and highlighted patient's strengths.   Diagnosis:   ICD-10-CM   1. Generalized anxiety disorder  F41.1      Plan:  Patient's goal of treatment is dealing with emotions, feelings, past problems, and going forward and processing these experiences to deal with them in a rational way.    Treatment Target: Understand the relationship between thoughts, emotions, and behaviors  Psychoeducation on CBTs model   Oriented the client to the therapeutic approach Teach the connection between thoughts, emotions, and behaviors  Enhance emotional awareness and discrimination of emotions  Understand thoughts, emotions, and behaviors  Treatment Target: Increase realistic balanced thinking -to learn how to replace thinking with thoughts that are more accurate or helpful Explore patient's thoughts, beliefs, automatic thoughts, assumptions  Identify and replace unhelpful thinking patterns (upsetting ideas, self-talk and mental images) Process distress and allow for emotional release  Questioning and challenging thoughts Cognitive reappraisal  Restructuring, Socratic questioning  Provided psychoeducation on core beliefs, explore, and assist patient in identifying core beliefs and modify underlying beliefs Treatment Target: Creating a life worth living  Values clarification   Self-care - nutrition, sleep, exercise  Mindfulness skills Interpersonal Effectiveness  Emotion Regulation Distress Tolerance Treatment Target: Increase coping skills to manage panic and panic attacks  Intentional breathing exercises 5 senses Grounding exercises - holding ice cube, ice on forehead, washing hands in cold water  Future Appointments  Date Time Provider [place]  09/16/2022  2:00 PM Milton Ferguson, LCSW [facility] None    Milton Ferguson, LCSW

## 2022-09-16 ENCOUNTER — Ambulatory Visit: Payer: Self-pay | Admitting: Licensed Clinical Social Worker

## 2022-09-16 DIAGNOSIS — F411 Generalized anxiety disorder: Secondary | ICD-10-CM

## 2022-09-16 NOTE — Progress Notes (Signed)
Counselor/Therapist Progress Note  Patient ID: Jose George, MRN: [id number],    Date: 09/16/2022  Time Spent: 47 minutes    Treatment Type: Psychotherapy  Reported Symptoms:  Good mood, feelings of anticipation and excitement   Mental Status Exam:  Appearance:   Casual and Neat     Behavior:  Appropriate, Sharing, and Motivated  Motor:  Normal  Speech/Language:   Clear and Coherent and Normal Rate  Affect:  Appropriate, Congruent, and Full Range  Mood:  normal  Thought process:  normal  Thought content:    WNL  Sensory/Perceptual disturbances:    WNL  Orientation:  oriented to person, place, time/date, and situation  Attention:  Good  Concentration:  Good  Memory:  WNL  Fund of knowledge:   Good  Insight:    Fair  Judgment:   Fair  Impulse Control:  Fair   Risk Assessment: Danger to Self:  No Self-injurious Behavior: No Danger to Others: No Duty to Warn:no Physical Aggression / Violence:No  Access to Firearms a concern: No  Gang Involvement:No   Subjective: Patient was receptive to feedback and intervention from LCSW and actively and effectively participated throughout the session. He reports overall stable mood over the past week. Patient is likely to benefit from future treatment because he remains motivated to decrease symptoms of anxiety and depressive symptoms and reports benefit of regular sessions in addressing these symptoms.     Interventions: Cognitive Behavioral Therapy and Client Centered  Established psychological safety. Checked in with patient regarding his week. LCSW and patient discussed his experience with anxiety and panic over the last few years, as well as his challenges with lack of support from friends and family. LCSW validated patient's feelings, discussed rigid thoughts and judgements, and highlighted his unhelpful thoughts, reframing thoughts leading to distress and increased isolation. Encouraged patient to notice his automatic thoughts.  Provided support through active listening, validation of feelings, and highlighted patient's strengths.    Diagnosis:   ICD-10-CM   1. Generalized anxiety disorder  F41.1      Plan:  Teach patient about automatic thoughts, thinking styles and core beliefs and continuing to identifying unhelpful thoughts.   Patient's goal of treatment is dealing with emotions, feelings, past problems, and going forward and processing these experiences to deal with them in a rational way.    Treatment Target: Understand the relationship between thoughts, emotions, and behaviors  Psychoeducation on CBTs model   Oriented the client to the therapeutic approach Teach the connection between thoughts, emotions, and behaviors  Enhance emotional awareness and discrimination of emotions  Understand thoughts, emotions, and behaviors  Treatment Target: Increase realistic balanced thinking -to learn how to replace thinking with thoughts that are more accurate or helpful Explore patient's thoughts, beliefs, automatic thoughts, assumptions  Identify and replace unhelpful thinking patterns (upsetting ideas, self-talk and mental images) Process distress and allow for emotional release  Questioning and challenging thoughts Cognitive reappraisal  Restructuring, Socratic questioning  Provided psychoeducation on core beliefs, explore, and assist patient in identifying core beliefs and modify underlying beliefs Treatment Target: Creating a life worth living  Values clarification   Self-care - nutrition, sleep, exercise  Mindfulness skills Interpersonal Effectiveness  Emotion Regulation Distress Tolerance Treatment Target: Increase coping skills to manage panic and panic attacks  Intentional breathing exercises 5 senses Grounding exercises - holding ice cube, ice on forehead, washing hands in cold water  Future Appointments  Date Time Provider [place]  09/23/2022  3:00 PM Milton Ferguson, LCSW  [facility] None     Milton Ferguson, LCSW

## 2022-09-23 ENCOUNTER — Ambulatory Visit: Payer: Self-pay | Admitting: Licensed Clinical Social Worker

## 2022-09-23 DIAGNOSIS — F411 Generalized anxiety disorder: Secondary | ICD-10-CM

## 2022-09-23 NOTE — Progress Notes (Signed)
Counselor/Therapist Progress Note  Patient ID: Jose George, MRN: [id number],    Date: 09/23/2022  Time Spent: 45 minutes    Treatment Type: Psychotherapy  Reported Symptoms:  static anxiety symptoms, anxious thoughts, worries, ruminating thoughts   Mental Status Exam:  Appearance:   Casual and Neat     Behavior:  Appropriate, Sharing, and Motivated  Motor:  Normal  Speech/Language:   Clear and Coherent and Normal Rate  Affect:  Appropriate, Congruent, and Full Range  Mood:  normal  Thought process:  normal  Thought content:    WNL  Sensory/Perceptual disturbances:    WNL  Orientation:  oriented to person, place, time/date, situation, and day of week  Attention:  Good  Concentration:  Good  Memory:  WNL  Fund of knowledge:   Good  Insight:    Fair  Judgment:   Fair  Impulse Control:  Fair   Risk Assessment: Danger to Self:  No Self-injurious Behavior: No Danger to Others: No Duty to Warn:no Physical Aggression / Violence:No  Access to Firearms a concern: No  Gang Involvement:No   Subjective: Patient was receptive to feedback and intervention from LCSW and actively and effectively participated throughout the session. Patient is likely to benefit from future treatment because he remains motivated to decrease symptoms of anxiety and depression and reports benefit of regular sessions in addressing these symptoms.     Interventions: Cognitive Behavioral Therapy and client centered  Established psychological safety. Checked in with patient regarding their week and set session agenda. LCSW provided psychoeducation on Unhelpful Thoughts and explored patient's experience with these thought patterns, and taught patient STOP.  Provided support through active listening, validation of feelings, and highlighted patient's strengths.  Diagnosis:   ICD-10-CM   1. Generalized anxiety disorder  F41.1      Plan: core beliefs and continuing to identifying unhelpful thoughts.     Patient's goal of treatment is dealing with emotions, feelings, past problems, and going forward and processing these experiences to deal with them in a rational way.    Treatment Target: Understand the relationship between thoughts, emotions, and behaviors  Psychoeducation on CBTs model   Oriented the client to the therapeutic approach Teach the connection between thoughts, emotions, and behaviors  Enhance emotional awareness and discrimination of emotions  Understand thoughts, emotions, and behaviors  Treatment Target: Increase realistic balanced thinking -to learn how to replace thinking with thoughts that are more accurate or helpful Explore patient's thoughts, beliefs, automatic thoughts, assumptions  Identify and replace unhelpful thinking patterns (upsetting ideas, self-talk and mental images) Process distress and allow for emotional release  Questioning and challenging thoughts Cognitive reappraisal  Restructuring, Socratic questioning  Provided psychoeducation on core beliefs, explore, and assist patient in identifying core beliefs and modify underlying beliefs Treatment Target: Creating a life worth living  Values clarification   Self-care - nutrition, sleep, exercise  Mindfulness skills Interpersonal Effectiveness  Emotion Regulation Distress Tolerance Treatment Target: Increase coping skills to manage panic and panic attacks  Intentional breathing exercises 5 senses Grounding exercises - holding ice cube, ice on forehead, washing hands in cold water  Future Appointments  Date Time Provider [place]  09/30/2022  3:00 PM Milton Ferguson, LCSW [facility] None    Milton Ferguson, LCSW

## 2022-09-25 DIAGNOSIS — R000 Tachycardia, unspecified: Principal | ICD-10-CM

## 2022-09-25 MED ORDER — PROPRANOLOL 10 MG TABLET
ORAL_TABLET | Freq: Two times a day (BID) | ORAL | 3 refills | 0 days
Start: 2022-09-25 — End: ?

## 2022-09-26 MED ORDER — PROPRANOLOL 10 MG TABLET
ORAL_TABLET | Freq: Two times a day (BID) | ORAL | 3 refills | 90 days | Status: CP
Start: 2022-09-26 — End: ?

## 2022-09-30 ENCOUNTER — Ambulatory Visit: Payer: Self-pay | Admitting: Licensed Clinical Social Worker

## 2022-09-30 DIAGNOSIS — F411 Generalized anxiety disorder: Secondary | ICD-10-CM

## 2022-09-30 NOTE — Progress Notes (Signed)
Counselor/Therapist Progress Note  Patient ID: Jose George, MRN: [id number],    Date: 09/30/2022  Time Spent: 47 minutes    Treatment Type: Psychotherapy  Reported Symptoms:  Increased anxiety, sleep disturbance,   Mental Status Exam:  Appearance:   Casual and Neat     Behavior:  Appropriate, Sharing, and Motivated  Motor:  Normal  Speech/Language:   Clear and Coherent and Normal Rate  Affect:  Appropriate, Congruent, and Full Range  Mood:  normal  Thought process:  normal  Thought content:    WNL  Sensory/Perceptual disturbances:    WNL  Orientation:  oriented to person, place, time/date, and situation  Attention:  Good  Concentration:  Good  Memory:  WNL  Fund of knowledge:   Good  Insight:    Fair  Judgment:   Fair  Impulse Control:  Fair   Risk Assessment: Danger to Self:  No Self-injurious Behavior: No Danger to Others: No Duty to Warn:no Physical Aggression / Violence:No  Access to Firearms a concern: No  Gang Involvement:No   Subjective: Patient was receptive to feedback and intervention from LCSW and actively and effectively participated throughout the session. He reports increased anxiety over the past week. Patient is likely to benefit from future treatment because he remains motivated to decrease anxiety.   Interventions: Cognitive Behavioral Therapy Established psychological safety. Checked in with patient regarding his week. LCSW processed with the patient how they have been doing since the last follow-up session. LCSW and patient discussed patient's increased anxiety over this past week, LCSW discussed medication management with patient and encouraged patient to discuss with PCP; patient voices resistance to med management. LCSW reviewed mindfulness and encouraged patient to practice daily before bed. Provided support through active listening, validation of feelings, and highlighted patient's strengths.  Diagnosis:   ICD-10-CM   1. Generalized anxiety  disorder  F41.1      Plan: NEXT SESSION: Check on use of guided meditations. Teach core beliefs; and continue to identifying unhelpful thoughts.    Patient's goal of treatment is dealing with emotions, feelings, past problems, and going forward and processing these experiences to deal with them in a rational way.    Treatment Target: Understand the relationship between thoughts, emotions, and behaviors  Psychoeducation on CBTs model   Oriented the client to the therapeutic approach Teach the connection between thoughts, emotions, and behaviors  Enhance emotional awareness and discrimination of emotions  Understand thoughts, emotions, and behaviors  Treatment Target: Increase realistic balanced thinking -to learn how to replace thinking with thoughts that are more accurate or helpful Explore patient's thoughts, beliefs, automatic thoughts, assumptions  Identify and replace unhelpful thinking patterns (upsetting ideas, self-talk and mental images) Process distress and allow for emotional release  Questioning and challenging thoughts Cognitive reappraisal  Restructuring, Socratic questioning  Provided psychoeducation on core beliefs, explore, and assist patient in identifying core beliefs and modify underlying beliefs Treatment Target: Creating a life worth living  Values clarification   Self-care - nutrition, sleep, exercise  Mindfulness skills Interpersonal Effectiveness  Emotion Regulation Distress Tolerance Treatment Target: Increase coping skills to manage panic and panic attacks  Intentional breathing exercises 5 senses Grounding exercises - holding ice cube, ice on forehead, washing hands in cold water  Future Appointments  Date Time Provider [place]  10/07/2022  3:00 PM Milton Ferguson, LCSW [facility] None     Milton Ferguson, LCSW

## 2022-10-01 ENCOUNTER — Emergency Department
Admission: EM | Admit: 2022-10-01 | Discharge: 2022-10-01 | Disposition: A | Discharge status: Home / Self Care | Payer: BC Managed Care – PPO | Attending: Emergency Medicine | Admitting: Emergency Medicine

## 2022-10-01 ENCOUNTER — Encounter: Payer: Self-pay | Admitting: Emergency Medicine

## 2022-10-01 ENCOUNTER — Other Ambulatory Visit: Payer: Self-pay

## 2022-10-01 ENCOUNTER — Emergency Department: Payer: BC Managed Care – PPO

## 2022-10-01 DIAGNOSIS — Z720 Tobacco use: Secondary | ICD-10-CM | POA: Insufficient documentation

## 2022-10-01 DIAGNOSIS — Z21 Asymptomatic human immunodeficiency virus [HIV] infection status: Secondary | ICD-10-CM | POA: Diagnosis not present

## 2022-10-01 DIAGNOSIS — K6289 Other specified diseases of anus and rectum: Secondary | ICD-10-CM | POA: Insufficient documentation

## 2022-10-01 DIAGNOSIS — R109 Unspecified abdominal pain: Secondary | ICD-10-CM

## 2022-10-01 LAB — CBC WITH DIFFERENTIAL/PLATELET
Abs Immature Granulocytes: 0.03 10*3/uL (ref 0.00–0.07)
Basophils Absolute: 0.1 10*3/uL (ref 0.0–0.1)
Basophils Relative: 1 %
Eosinophils Absolute: 0.4 10*3/uL (ref 0.0–0.5)
Eosinophils Relative: 5 %
HCT: 44.2 % (ref 39.0–52.0)
Hemoglobin: 14.6 g/dL (ref 13.0–17.0)
Immature Granulocytes: 0 %
Lymphocytes Relative: 48 %
Lymphs Abs: 3.6 10*3/uL (ref 0.7–4.0)
MCH: 32.2 pg (ref 26.0–34.0)
MCHC: 33 g/dL (ref 30.0–36.0)
MCV: 97.4 fL (ref 80.0–100.0)
Monocytes Absolute: 0.6 10*3/uL (ref 0.1–1.0)
Monocytes Relative: 8 %
Neutro Abs: 2.9 10*3/uL (ref 1.7–7.7)
Neutrophils Relative %: 38 %
Platelets: 222 10*3/uL (ref 150–400)
RBC: 4.54 MIL/uL (ref 4.22–5.81)
RDW: 12.1 % (ref 11.5–15.5)
WBC: 7.7 10*3/uL (ref 4.0–10.5)
nRBC: 0 % (ref 0.0–0.2)

## 2022-10-01 LAB — COMPREHENSIVE METABOLIC PANEL
ALT: 25 U/L (ref 0–44)
AST: 22 U/L (ref 15–41)
Albumin: 4.2 g/dL (ref 3.5–5.0)
Alkaline Phosphatase: 67 U/L (ref 38–126)
Anion gap: 10 (ref 5–15)
BUN: 8 mg/dL (ref 6–20)
CO2: 27 mmol/L (ref 22–32)
Calcium: 9.3 mg/dL (ref 8.9–10.3)
Chloride: 100 mmol/L (ref 98–111)
Creatinine, Ser: 1.17 mg/dL (ref 0.61–1.24)
GFR, Estimated: >60 mL/min (ref >60)
Glucose, Bld: 91 mg/dL (ref 70–99)
Potassium: 4 mmol/L (ref 3.5–5.1)
Sodium: 137 mmol/L (ref 135–145)
Total Bilirubin: 0.6 mg/dL (ref 0.3–1.2)
Total Protein: 7.5 g/dL (ref 6.5–8.1)

## 2022-10-01 LAB — URINALYSIS, ROUTINE W REFLEX MICROSCOPIC
Bilirubin Urine: NEGATIVE
Glucose, UA: NEGATIVE mg/dL
Hgb urine dipstick: NEGATIVE
Ketones, ur: NEGATIVE mg/dL
Leukocytes,Ua: NEGATIVE
Nitrite: NEGATIVE
Protein, ur: NEGATIVE mg/dL
Specific Gravity, Urine: 1.008 (ref 1.005–1.030)
pH: 7 (ref 5.0–8.0)

## 2022-10-01 MED ORDER — SODIUM CHLORIDE 0.9 % IV SOLN
100.0000 mg | Freq: Once | INTRAVENOUS | Status: AC
  Administered 2022-10-01: 100 mg via INTRAVENOUS
  Filled 2022-10-01: qty 100

## 2022-10-01 MED ORDER — KETOROLAC TROMETHAMINE 30 MG/ML IJ SOLN
15.0000 mg | Freq: Once | INTRAMUSCULAR | Status: AC
  Administered 2022-10-01: 15 mg via INTRAVENOUS
  Filled 2022-10-01: qty 1

## 2022-10-01 MED ORDER — ONDANSETRON 4 MG PO TBDP: 4.0000 mg | ORAL_TABLET | Freq: Three times a day (TID) | ORAL | 0 refills | Status: AC | PRN

## 2022-10-01 MED ORDER — HYDROCODONE-ACETAMINOPHEN 5-325 MG PO TABS: 1.0000 | ORAL_TABLET | Freq: Four times a day (QID) | ORAL | 0 refills | Status: AC | PRN

## 2022-10-01 MED ORDER — ONDANSETRON HCL 4 MG/2ML IJ SOLN
4.0000 mg | Freq: Once | INTRAMUSCULAR | Status: AC
  Administered 2022-10-01: 4 mg via INTRAVENOUS
  Filled 2022-10-01: qty 2

## 2022-10-01 MED ORDER — SODIUM CHLORIDE 0.9 % IV BOLUS
1000.0000 mL | Freq: Once | INTRAVENOUS | Status: AC
  Administered 2022-10-01: 1000 mL via INTRAVENOUS

## 2022-10-01 MED ORDER — DOXYCYCLINE HYCLATE 50 MG PO CAPS: 100.0000 mg | ORAL_CAPSULE | Freq: Two times a day (BID) | ORAL | 0 refills | Status: AC

## 2022-10-01 NOTE — Discharge Instructions (Signed)
1.  Take antibiotics as prescribed (doxycycline 100 mg twice daily x 14 days). 2.  You may take Ibuprofen as needed for pain; Norco as needed for more severe pain. 3.  You may take Zofran as needed for nausea. 4.  Return to the ER for worsening symptoms, persistent vomiting, fever, difficulty breathing or other concerns.

## 2022-10-01 NOTE — ED Provider Notes (Signed)
[facility] Provider Note    Event Date/Time   First MD Initiated Contact with Patient 10/01/22 0148     (approximate)   History   Flank Pain   HPI  Jose George is a 52 y.o. male who presents to the ED from home with a chief complaint of right flank pain.  Patient reports waxing/waning pain x 4 days accompanied by urinary hesitancy and frequency.  History of kidney stone 20 years ago.  Denies associated fever/chills, chest pain, shortness of breath, abdominal pain, nausea, vomiting or hematuria.     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 No pertinent surgical history.   Home Medications   Prior to Admission medications   Medication Sig Start Date End Date Taking? Authorizing Provider  doxycycline (VIBRAMYCIN) 50 MG capsule Take 2 capsules (100 mg total) by mouth 2 (two) times daily for 14 days. 10/01/22 10/15/22 Yes Paulette Blanch, MD  HYDROcodone-acetaminophen (NORCO) 5-325 MG tablet Take 1 tablet by mouth every 6 (six) hours as needed for moderate pain. 10/01/22  Yes Paulette Blanch, MD  ondansetron (ZOFRAN-ODT) 4 MG disintegrating tablet Take 1 tablet (4 mg total) by mouth every 8 (eight) hours as needed for nausea or vomiting. 10/01/22  Yes Paulette Blanch, MD  bictegravir-emtricitabine-tenofovir AF (BIKTARVY) 50-200-25 MG TABS tablet Take by mouth. 11/30/18   [provider]     Allergies  Patient has no known  allergies.   Family History   Family History  Problem Relation Age of Onset   CAD Mother      Physical Exam  Triage Vital Signs: ED Triage Vitals [10/01/22 0133]  Enc Vitals Group     BP (!) 169/100     Pulse Rate 70     Resp 18     Temp (!) 97.4 F (36.3 C)     Temp Source Oral     SpO2 98 %     Weight 200 lb (90.7 kg)     Height 5\' 11"  (1.803 m)     Head Circumference      [facility]      Pain Score      Pain Loc      Pain Edu?      Excl. in [place]?     Updated Vital Signs: BP (!) 166/95 (BP Location: Left Arm)   Pulse 77   Temp 97.7 F (36.5 C) (Oral)   Resp 18   Ht 5\' 11"  (1.803 m)   Wt 90.7 kg   SpO2 98%   BMI 27.89 kg/m    General: Awake, no distress.  CV:  RRR.  Good peripheral perfusion.  Resp:  Normal effort.  CTAB. Abd:  Nontender to light or deep palpation.  Mild right CVAT.  No distention.  Other:  No truncal vesicles.   ED Results / Procedures / Treatments  Labs (all labs ordered are listed, but only abnormal results are displayed) Labs Reviewed  URINALYSIS,  ROUTINE W REFLEX MICROSCOPIC - Abnormal; Notable for the following components:      Result Value   Color, Urine STRAW (*)    APPearance CLEAR (*)    All other components within normal limits  CBC WITH DIFFERENTIAL/PLATELET  COMPREHENSIVE METABOLIC PANEL     EKG  None   RADIOLOGY I have independently visualized and interpreted patient's CT scan as well as noted the radiology interpretation:  CT renal stone study: No visible stone; wall thickening involving the rectum suggesting proctitis  Official radiology report(s): CT Renal Stone Study  Result Date: 10/01/2022 CLINICAL DATA:  Abdominal and flank pain with stone suspected. Right flank pain since Sunday. Previous history of kidney stones. EXAM: CT ABDOMEN AND PELVIS WITHOUT CONTRAST TECHNIQUE: Multidetector CT imaging of the abdomen and pelvis was performed following the standard protocol without IV contrast. RADIATION DOSE  REDUCTION: This exam was performed according to the departmental dose-optimization program which includes automated exposure control, adjustment of the mA and/or kV according to patient size and/or use of iterative reconstruction technique. COMPARISON:  03/06/2021 FINDINGS: Lower chest: Lung bases are clear. Hepatobiliary: No focal liver abnormality is seen. No gallstones, gallbladder wall thickening, or biliary dilatation. Pancreas: Unremarkable. No pancreatic ductal dilatation or surrounding inflammatory changes. Spleen: Normal in size without focal abnormality. Adrenals/Urinary Tract: No adrenal gland nodules. Kidneys are symmetrical. No hydronephrosis or hydroureter. No renal, ureteral, or bladder stones. Bladder is normal. Stomach/Bowel: Stomach, small bowel, and colon are not abnormally distended. Scattered stool throughout the colon. Wall thickening involving the rectum suggesting proctitis. Otherwise, no wall thickening or inflammatory changes are seen. The appendix is normal. Vascular/Lymphatic: Calcification of the aorta. No aneurysm. No significant lymphadenopathy. Reproductive: Prostate is unremarkable. Other: No abdominal wall hernia or abnormality. No abdominopelvic ascites. Musculoskeletal: No acute or significant osseous findings. IMPRESSION: 1. No renal or ureteral stone or obstruction. 2. Wall thickening involving the rectum suggesting proctitis. The remainder of the colon is unremarkable. No evidence of bowel obstruction. 3. Mild aortic atherosclerosis. Electronically Signed   By: William  Stevens M.D.   On: 10/01/2022 02:24     PROCEDURES:  Critical Care performed: No  Procedures   MEDICATIONS ORDERED IN ED: Medications  sodium chloride 0.9 % bolus 1,000 mL (0 mLs Intravenous Stopped 10/01/22 0313)  ondansetron (ZOFRAN) injection 4 mg (4 mg Intravenous Given 10/01/22 0211)  ketorolac (TORADOL) 30 MG/ML injection 15 mg (15 mg Intravenous Given 10/01/22 0211)  doxycycline (VIBRAMYCIN)  100 mg in sodium chloride 0.9 % 250 mL IVPB (0 mg Intravenous Stopped 10/01/22 0532)     IMPRESSION / MDM / ASSESSMENT AND PLAN / ED COURSE  I reviewed the triage vital signs and the nursing notes.                             51  year old male presenting with right flank pain. Differential diagnosis includes, but is not limited to, acute appendicitis, renal colic, testicular torsion, urinary tract infection/pyelonephritis, prostatitis,  epididymitis, diverticulitis, small bowel obstruction or ileus, colitis, abdominal aortic aneurysm, gastroenteritis, hernia, etc. personally reviewed patient's records and note a recent visit yesterday with behavioral medicine counselor for generalized anxiety disorder.  Patient's presentation is most consistent with acute presentation with potential threat to life or bodily function.  Will obtain basic lab work, UA and CT renal colic study.  Initiate IV fluid hydration, IV Toradol for pain, IV Zofran for nausea.  Will reassess.  Clinical Course as of 10/01/22 [phone]  Wed Oct 01, 2022  0312 Postvoid residual 20 mL.  Patient feeling better, resting in no acute distress.  Updated him of normal test and urine results and CT scan concerning for proctitis.  Will administer IV doxycycline now and discharge home on doxycycline and as needed Norco and Zofran.  He will follow-up closely with his PCP.  Strict return precautions given.  Patient verbalizes understanding and agrees with plan of care. [JS]    Clinical Course User Index [JS] Paulette Blanch, MD     FINAL CLINICAL IMPRESSION(S) / ED DIAGNOSES   Final diagnoses:  Right flank pain  Proctitis     Rx / DC Orders   ED Discharge Orders          Ordered    doxycycline (VIBRAMYCIN) 50 MG capsule  2 times daily        10/01/22 0334    HYDROcodone-acetaminophen (NORCO) 5-325 MG tablet  Every 6 hours PRN        10/01/22 0334    ondansetron (ZOFRAN-ODT) 4 MG disintegrating tablet  Every 8 hours PRN         10/01/22 0334             Note:  This document was prepared using Dragon voice recognition software and may include unintentional dictation errors.   Paulette Blanch, MD 10/01/22 [phone]

## 2022-10-01 NOTE — ED Triage Notes (Signed)
Patient ambulatory to triage with steady gait, without difficulty or distress noted; pt reports since Sunday having rt flank pain, nonradiating accomp by urinary frequency

## 2022-10-01 NOTE — ED Notes (Signed)
Bladder scan revealed 21ml post void residual

## 2022-10-07 ENCOUNTER — Ambulatory Visit: Payer: Self-pay | Admitting: Licensed Clinical Social Worker

## 2022-10-07 DIAGNOSIS — F411 Generalized anxiety disorder: Secondary | ICD-10-CM

## 2022-10-07 NOTE — Progress Notes (Signed)
Counselor/Therapist Progress Note  Patient ID: Jose George, MRN: [id number],    Date: 10/07/2022  Time Spent: 45 minutes    Treatment Type: Psychotherapy  Reported Symptoms:  "up and down," secondary to multiple stressors   Mental Status Exam:  Appearance:   Casual     Behavior:  Appropriate, Sharing, and Motivated  Motor:  Normal  Speech/Language:   Clear and Coherent and Normal Rate  Affect:  Appropriate, Congruent, and Full Range  Mood:  normal  Thought process:  normal  Thought content:    WNL  Sensory/Perceptual disturbances:    WNL  Orientation:  oriented to person, place, time/date, situation, and day of week  Attention:  Good  Concentration:  Good  Memory:  WNL  Fund of knowledge:   Good  Insight:    Fair  Judgment:   Fair  Impulse Control:  Fair   Risk Assessment: Danger to Self:  No Self-injurious Behavior: No Danger to Others: No Duty to Warn:no Physical Aggression / Violence:No  Access to Firearms a concern: No  Gang Involvement:No   Subjective: Patient was receptive to feedback and intervention from LCSW and actively and effectively participated throughout the session. He reports his mood has been up and down due to multiple stressors. Patient is likely to benefit from future treatment because he remains motivated to decrease symptoms and improve functioning.    Interventions: Cognitive Behavioral Therapy and Client Centered  Established psychological safety. Checked in with patient regarding their week. LCSW assisted patient in processing their thoughts and emotions about recent encounter and unresolved issues with ex-boyfriend. LCSW provided supportive space, through active listening, validation of feelings, and highlighted patient's strengths. LCSW discussed grief and loss with patient, encouraged patient to increase self-care activities, including going to the gym and seeking support through interactions with positive peers.   Diagnosis:   ICD-10-CM    1. Generalized anxiety disorder  F41.1      Plan: NEXT SESSION: Check on use of guided meditations. Teach core beliefs; and continue to identifying unhelpful thoughts.    Patient's goal of treatment is dealing with emotions, feelings, past problems, and going forward and processing these experiences to deal with them in a rational way.    Treatment Target: Understand the relationship between thoughts, emotions, and behaviors  Psychoeducation on CBTs model   Oriented the client to the therapeutic approach Teach the connection between thoughts, emotions, and behaviors  Enhance emotional awareness and discrimination of emotions  Understand thoughts, emotions, and behaviors  Treatment Target: Increase realistic balanced thinking -to learn how to replace thinking with thoughts that are more accurate or helpful Explore patient's thoughts, beliefs, automatic thoughts, assumptions  Identify and replace unhelpful thinking patterns (upsetting ideas, self-talk and mental images) Process distress and allow for emotional release  Questioning and challenging thoughts Cognitive reappraisal  Restructuring, Socratic questioning  Provided psychoeducation on core beliefs, explore, and assist patient in identifying core beliefs and modify underlying beliefs Treatment Target: Creating a life worth living  Values clarification   Self-care - nutrition, sleep, exercise  Mindfulness skills Interpersonal Effectiveness  Emotion Regulation Distress Tolerance Treatment Target: Increase coping skills to manage panic and panic attacks  Intentional breathing exercises 5 senses Grounding exercises - holding ice cube, ice on forehead, washing hands in cold water  Future Appointments  Date Time Provider [place]  10/14/2022  3:00 PM Milton Ferguson, LCSW [facility] None     Milton Ferguson, LCSW

## 2022-10-14 ENCOUNTER — Ambulatory Visit: Payer: Self-pay | Admitting: Licensed Clinical Social Worker

## 2022-10-14 DIAGNOSIS — F411 Generalized anxiety disorder: Secondary | ICD-10-CM

## 2022-10-14 NOTE — Progress Notes (Signed)
Counselor/Therapist Progress Note  Patient ID: Jose George, MRN: [id number],    Date: 10/14/2022  Time Spent: 40 minutes (technical issues connecting via Zoom so ended up using phone)    Treatment Type: Psychotherapy  Reported Symptoms:  Anxiety, anxious thoughts; Intermittent sadness and low mood   Mental Status Exam:  Appearance:   Casual     Behavior:  Appropriate, Sharing, and Motivated  Motor:  Normal  Speech/Language:   Clear and Coherent and Normal Rate  Affect:  Appropriate, Congruent, and Full Range  Mood:  normal  Thought process:  normal  Thought content:    WNL  Sensory/Perceptual disturbances:    WNL  Orientation:  oriented to person, place, time/date, situation, and day of week  Attention:  Good  Concentration:  Good  Memory:  WNL  Fund of knowledge:   Good  Insight:    Fair  Judgment:   Fair  Impulse Control:  Fair   Risk Assessment:No Danger to Self:  No Self-injurious Behavior: No Danger to Others: No Duty to Warn:no Physical Aggression / Violence:No  Access to Firearms a concern: No  Gang Involvement:No   Subjective: Patient was receptive to feedback and actively participated throughout the session. Patient reports increased anxiety and depressive symptoms due to multiple stressors. Patient is likely to benefit from future treatment because he remains motivated to decrease these symptoms and reports benefit of regular sessions in addressing these symptoms.    Interventions: Cognitive Behavioral Therapy and client centered  Established psychological safety. Checked in with patient regarding his week and how he has been since last follow up session. Provided supportive space for patient to verbally ventilate thoughts and feelings related to relationship challenges and challenges with work. Checked in with client about use of guided mindfulness meditation, clarified information and encouraged patient to use daily.  Provided support through active  listening, validation of feelings, and highlighted patient's strengths.   Diagnosis:   ICD-10-CM   1. Generalized anxiety disorder  F41.1      Plan: NEXT SESSION: Check on use of guided meditations. Teach core beliefs; and continue to identifying unhelpful thoughts.    Patient's goal of treatment is dealing with emotions, feelings, past problems, and going forward and processing these experiences to deal with them in a rational way.    Treatment Target: Understand the relationship between thoughts, emotions, and behaviors  Psychoeducation on CBTs model   Oriented the client to the therapeutic approach Teach the connection between thoughts, emotions, and behaviors  Enhance emotional awareness and discrimination of emotions  Understand thoughts, emotions, and behaviors  Treatment Target: Increase realistic balanced thinking -to learn how to replace thinking with thoughts that are more accurate or helpful Explore patient's thoughts, beliefs, automatic thoughts, assumptions  Identify and replace unhelpful thinking patterns (upsetting ideas, self-talk and mental images) Process distress and allow for emotional release  Questioning and challenging thoughts Cognitive reappraisal  Restructuring, Socratic questioning  Provided psychoeducation on core beliefs, explore, and assist patient in identifying core beliefs and modify underlying beliefs Treatment Target: Creating a life worth living  Values clarification   Self-care - nutrition, sleep, exercise  Mindfulness skills Interpersonal Effectiveness  Emotion Regulation Distress Tolerance Treatment Target: Increase coping skills to manage panic and panic attacks  Intentional breathing exercises 5 senses Grounding exercises - holding ice cube, ice on forehead, washing hands in cold water  Future Appointments  Date Time Provider Department Center  10/20/2022  4:20 PM Kathreen Cosier, LCSW [facility] None  10/28/2022  3:00  PM Kathreen Cosier, LCSW  [facility] None      Kathreen Cosier, [place]

## 2022-10-20 ENCOUNTER — Ambulatory Visit: Payer: Self-pay | Admitting: Licensed Clinical Social Worker

## 2022-10-20 ENCOUNTER — Telehealth: Payer: Self-pay | Admitting: Licensed Clinical Social Worker

## 2022-10-20 NOTE — Telephone Encounter (Signed)
Patient did not show on time to Zoom appt. LCSW attempted to call and patient joined after 8 minute, at that time provider was not available, provider joined Zoom by 25 minutes after scheduled appt. time and patient had disconnected. LCSW communicated via email and patient declined to reschedule and reported he was doing great this week and would keep appt. scheduled for next week.

## 2022-10-20 NOTE — Progress Notes (Incomplete)
Counselor/Therapist Progress Note  Patient ID: Jeramiha Spanel, MRN: [id number],    Date: 10/20/2022  Time Spent: ***   Treatment Type: {CHL AMB THERAPY [phone]}  Reported Symptoms: {CHL AMB Reported Symptoms:[phone]}  Mental Status Exam:  Appearance:   {PSY:22683}     Behavior:  {PSY:21022743}  Motor:  {PSY:22302}  Speech/Language:   {PSY:22685}  Affect:  {PSY:22687}  Mood:  {PSY:31886}  Thought process:  {PSY:31888}  Thought content:    {PSY:[phone]}  Sensory/Perceptual disturbances:    {PSY:[phone]}  Orientation:  {PSY:30297}  Attention:  {PSY:22877}  Concentration:  {[phone]}  Memory:  {PSY:[phone]}  Fund of knowledge:   {[phone]}  Insight:    {[phone]}  Judgment:   {[phone]}  Impulse Control:  {[phone]}   Risk Assessment: Danger to Self:  {PSY:22692} Self-injurious Behavior: {PSY:22692} Danger to Others: {PSY:22692} Duty to Warn:{PSY:311194} Physical Aggression / Violence:{PSY:21197} Access to Firearms a concern: {PSY:21197} Gang Involvement:{PSY:21197}  Subjective: ***   Interventions: {PSY:[phone]}  Diagnosis:No diagnosis found.  Plan: NEXT SESSION: Check on use of guided meditations. Teach core beliefs; and continue to identifying unhelpful thoughts.    Patient's goal of treatment is dealing with emotions, feelings, past problems, and going forward and processing these experiences to deal with them in a rational way.    Treatment Target: Understand the relationship between thoughts, emotions, and behaviors  Psychoeducation on CBTs model   Oriented the client to the therapeutic approach Teach the connection between thoughts, emotions, and behaviors  Enhance emotional awareness and discrimination of emotions  Understand thoughts, emotions, and behaviors  Treatment Target: Increase realistic balanced thinking -to learn how to replace thinking with thoughts that are more accurate or  helpful Explore patient's thoughts, beliefs, automatic thoughts, assumptions  Identify and replace unhelpful thinking patterns (upsetting ideas, self-talk and mental images) Process distress and allow for emotional release  Questioning and challenging thoughts Cognitive reappraisal  Restructuring, Socratic questioning  Provided psychoeducation on core beliefs, explore, and assist patient in identifying core beliefs and modify underlying beliefs Treatment Target: Creating a life worth living  Values clarification   Self-care - nutrition, sleep, exercise  Mindfulness skills Interpersonal Effectiveness  Emotion Regulation Distress Tolerance Treatment Target: Increase coping skills to manage panic and panic attacks  Intentional breathing exercises 5 senses Grounding exercises - holding ice cube, ice on forehead, washing hands in cold water   Kathreen Cosier, LCSW

## 2022-10-28 ENCOUNTER — Ambulatory Visit: Payer: Self-pay | Admitting: Licensed Clinical Social Worker

## 2022-10-28 DIAGNOSIS — F411 Generalized anxiety disorder: Secondary | ICD-10-CM

## 2022-10-28 NOTE — Progress Notes (Signed)
Counselor/Therapist Progress Note  Patient ID: Jose George, MRN: [id number],    Date: 10/28/2022  Time Spent: 45 minutes    Treatment Type: Psychotherapy  Reported Symptoms:  "up and down", anxiety, worries   Mental Status Exam:  Appearance:   Casual and Neat     Behavior:  Appropriate, Sharing, and Motivated  Motor:  Normal  Speech/Language:   Clear and Coherent and Normal Rate  Affect:  Appropriate, Congruent, and Full Range  Mood:  normal  Thought process:  normal  Thought content:    WNL  Sensory/Perceptual disturbances:    WNL  Orientation:  oriented to person, place, time/date, situation, and day of week  Attention:  Good  Concentration:  Good  Memory:  WNL  Fund of knowledge:   Good  Insight:    Fair  Judgment:   Fair  Impulse Control:  Fair   Risk Assessment: Danger to Self:  No Self-injurious Behavior: No Danger to Others: No Duty to Warn:no Physical Aggression / Violence:No  Access to Firearms a concern: No  Gang Involvement:No   Subjective: Patient was engaged and cooperative throughout the session using time effectively to discuss thoughts and feelings. He reports he has been up and down. Patient was receptive to feedback and intervention from LCSW and voices continued motivation for treatment. Patient is likely to benefit from future treatment because they remain motivated to decrease symptoms and reports benefit of regular sessions.       Interventions: Cognitive Behavioral Therapy and client centered  Established psychological safety. LCSW processed with the patient how they have been doing since the last follow-up session. LCSW assisted patient in processing their thoughts and emotions regarding unresolved issues with ex-boyfriend and related to other dating challenges. LCSW assisted patient in problem solving options related to boundaries in relationships. Checked in with patient about use of guided meditations and encouraged him to continue to try to  use them more often.  Provided support through active listening, validation of feelings, and highlighted patient's strengths.  Diagnosis:   ICD-10-CM   1. Generalized anxiety disorder  F41.1      Plan: NEXT SESSION: Check on use of guided meditations. Teach core beliefs; and continue to identifying unhelpful thoughts.    Patient's goal of treatment is dealing with emotions, feelings, past problems, and going forward and processing these experiences to deal with them in a rational way.    Treatment Target: Understand the relationship between thoughts, emotions, and behaviors  Psychoeducation on CBTs model   Oriented the client to the therapeutic approach Teach the connection between thoughts, emotions, and behaviors  Enhance emotional awareness and discrimination of emotions  Understand thoughts, emotions, and behaviors  Treatment Target: Increase realistic balanced thinking -to learn how to replace thinking with thoughts that are more accurate or helpful Explore patient's thoughts, beliefs, automatic thoughts, assumptions  Identify and replace unhelpful thinking patterns (upsetting ideas, self-talk and mental images) Process distress and allow for emotional release  Questioning and challenging thoughts Cognitive reappraisal  Restructuring, Socratic questioning  Provided psychoeducation on core beliefs, explore, and assist patient in identifying core beliefs and modify underlying beliefs Treatment Target: Creating a life worth living  Values clarification   Self-care - nutrition, sleep, exercise  Mindfulness skills Interpersonal Effectiveness  Emotion Regulation Distress Tolerance Treatment Target: Increase coping skills to manage panic and panic attacks  Intentional breathing exercises 5 senses Grounding exercises - holding ice cube, ice on forehead, washing hands in cold water  Future Appointments  Date Time  Provider Department Center  11/04/2022  3:00 PM Kathreen Cosier, LCSW  [facility] None    Kathreen Cosier, [place]

## 2022-11-04 ENCOUNTER — Ambulatory Visit: Payer: Self-pay | Admitting: Licensed Clinical Social Worker

## 2022-11-04 DIAGNOSIS — F411 Generalized anxiety disorder: Secondary | ICD-10-CM

## 2022-11-04 NOTE — Progress Notes (Signed)
Counselor/Therapist Progress Note  Patient ID: Jose George, MRN: [id number],    Date: 11/04/2022  Time Spent: 55 minutes    Treatment Type: Psychotherapy  Reported Symptoms:  Anxiety, anxious thoughts, worries   Mental Status Exam:  Appearance:   Casual     Behavior:  Appropriate, Sharing, and Motivated  Motor:  Normal  Speech/Language:   Clear and Coherent and Normal Rate  Affect:  Appropriate, Congruent, and Full Range  Mood:  normal  Thought process:  normal  Thought content:    WNL  Sensory/Perceptual disturbances:    WNL  Orientation:  oriented to person, place, time/date, and situation  Attention:  Good  Concentration:  Good  Memory:  WNL  Fund of knowledge:   Good  Insight:    Fair  Judgment:   Fair  Impulse Control:  Fair   Risk Assessment: Danger to Self:  No Self-injurious Behavior: No Danger to Others: No Duty to Warn:no Physical Aggression / Violence:No  Access to Firearms a concern: No  Gang Involvement:No   Subjective: Patient was engaged and cooperative throughout the session using time effectively to discuss thoughts, feelings and coping skills. Patient reports he has has a lot going on this past week with an ex-boyfriend leading to some anxiety and also some comfort. Patient voices continued motivation for treatment and agrees to practice cognitive diffusion skills as homework task. Patient remains motivated for treatment.   Interventions: Cognitive Behavioral Therapy and Client centered  Established psychological safety. Checked in with patient and processed with the patient how they have been doing since the last follow-up session. LCSW assisted patient in processing their thoughts and emotions regarding relationship challenges. LCSW provided psychoeducation and therapeutic intervention regarding coping with anxiety, including noticing thoughts, challenging thoughts, reframing unhelpful thoughts, and taught patient about cognitive fusion and hands in  face metaphor. LCSW encouraged patient to sit in feelings of uncertainty without judgement. Provided support through active listening, validation of feelings, and highlighted patient's strengths.  Diagnosis:   ICD-10-CM   1. Generalized anxiety disorder  F41.1      Plan: NEXT SESSION: Check on use of guided meditations. Teach core beliefs; and continue to identifying unhelpful thoughts.    Patient's goal of treatment is dealing with emotions, feelings, past problems, and going forward and processing these experiences to deal with them in a rational way.    Treatment Target: Understand the relationship between thoughts, emotions, and behaviors  Psychoeducation on CBTs model   Oriented the client to the therapeutic approach Teach the connection between thoughts, emotions, and behaviors  Enhance emotional awareness and discrimination of emotions  Understand thoughts, emotions, and behaviors  Treatment Target: Increase realistic balanced thinking -to learn how to replace thinking with thoughts that are more accurate or helpful Explore patient's thoughts, beliefs, automatic thoughts, assumptions  Identify and replace unhelpful thinking patterns (upsetting ideas, self-talk and mental images) Process distress and allow for emotional release  Questioning and challenging thoughts Cognitive reappraisal  Restructuring, Socratic questioning  Provided psychoeducation on core beliefs, explore, and assist patient in identifying core beliefs and modify underlying beliefs Treatment Target: Creating a life worth living  Values clarification   Self-care - nutrition, sleep, exercise  Mindfulness skills Interpersonal Effectiveness  Emotion Regulation Distress Tolerance Treatment Target: Increase coping skills to manage panic and panic attacks  Intentional breathing exercises 5 senses Grounding exercises - holding ice cube, ice on forehead, washing hands in cold water Treatment Target: Increase  psychological flexibility (Acceptance, defusion, observer self/perspective-taking, present moment  awareness, values and committed action) Teach mindfulness skills  Psychoeducation about cognitive fusion- Hand as thoughts metaphor  Increase awareness of control vs. acceptance Reduce avoidance of certain emotions or situations Discuss "unhooking" and emphasize that thoughts and feelings do not always lead to action Increasing self-knowledge  Getting in touch with the observant self  Values-guided behavioral change Future Appointments  Date Time Provider Department Center  11/11/2022  3:00 PM Kathreen Cosier, LCSW [facility] None     Kathreen Cosier, LCSW

## 2022-11-05 ENCOUNTER — Ambulatory Visit: Admit: 2022-11-05 | Payer: PRIVATE HEALTH INSURANCE

## 2022-11-11 ENCOUNTER — Ambulatory Visit: Payer: Self-pay | Admitting: Licensed Clinical Social Worker

## 2022-11-11 DIAGNOSIS — Z21 Asymptomatic human immunodeficiency virus [HIV] infection status: Principal | ICD-10-CM

## 2022-11-11 DIAGNOSIS — F411 Generalized anxiety disorder: Secondary | ICD-10-CM

## 2022-11-11 MED ORDER — BICTEGRAVIR 50 MG-EMTRICITABINE 200 MG-TENOFOVIR ALAFENAM 25 MG TABLET
ORAL_TABLET | Freq: Every day | ORAL | 11 refills | 30 days | Status: CP
Start: 2022-11-11 — End: 2022-11-11

## 2022-11-11 NOTE — Progress Notes (Signed)
Counselor/Therapist Progress Note  Patient ID: Jose George, MRN: [id number],    Date: 11/11/2022  Time Spent: 45 minutes    Treatment Type: Psychotherapy  Reported Symptoms:  Anxiety, nervousness   Mental Status Exam:  Appearance:   Casual and Neat     Behavior:  Appropriate, Sharing, and Motivated  Motor:  Normal  Speech/Language:   Clear and Coherent and Normal Rate  Affect:  Appropriate, Congruent, and Full Range  Mood:  normal  Thought process:  normal  Thought content:    WNL  Sensory/Perceptual disturbances:    WNL  Orientation:  oriented to person, place, time/date, and situation  Attention:  Good  Concentration:  Good  Memory:  WNL  Fund of knowledge:   Good  Insight:    Fair  Judgment:   Fair  Impulse Control:  Fair   Risk Assessment: Danger to Self:  No Self-injurious Behavior: No Danger to Others: No Duty to Warn:no Physical Aggression / Violence:No  Access to Firearms a concern: No  Gang Involvement:No   Subjective: Patient was engaged and cooperative throughout the session using time effectively to discuss thoughts and feelings. Patient reports up and down anxiety related to relationship challenges. Patient voices continued motivation for treatment and remains motivated to decrease anxiety.    Interventions: Cognitive Behavioral Therapy and Client Centered  Established psychological safety. Checked in with patient regarding his week. Reviewed previous session regarding challenges in intimate partner relationships, providing supportive space for patient to process thoughts and feelings. LCSW highlighted thoughts, creating increased objectivity, and reviewed mind reading and assumptions. LCSW reviewed the importance of self-care, including exercise and engaging in positive supportive relationships. Provided support through active listening, validation of feelings, and highlighted patient's strengths.   Diagnosis:   ICD-10-CM   1. Generalized anxiety  disorder  F41.1      Plan:  NEXT SESSION: Check on use of guided meditations. Teach core beliefs; and continue to identifying unhelpful thoughts.    Patient's goal of treatment is dealing with emotions, feelings, past problems, and going forward and processing these experiences to deal with them in a rational way.    Treatment Target: Understand the relationship between thoughts, emotions, and behaviors  Psychoeducation on CBTs model   Oriented the client to the therapeutic approach Teach the connection between thoughts, emotions, and behaviors  Enhance emotional awareness and discrimination of emotions  Understand thoughts, emotions, and behaviors  Treatment Target: Increase realistic balanced thinking -to learn how to replace thinking with thoughts that are more accurate or helpful Explore patient's thoughts, beliefs, automatic thoughts, assumptions  Identify and replace unhelpful thinking patterns (upsetting ideas, self-talk and mental images) Process distress and allow for emotional release  Questioning and challenging thoughts Cognitive reappraisal  Restructuring, Socratic questioning  Provided psychoeducation on core beliefs, explore, and assist patient in identifying core beliefs and modify underlying beliefs Treatment Target: Creating a life worth living  Values clarification   Self-care - nutrition, sleep, exercise  Mindfulness skills Interpersonal Effectiveness  Emotion Regulation Distress Tolerance Treatment Target: Increase coping skills to manage panic and panic attacks  Intentional breathing exercises 5 senses Grounding exercises - holding ice cube, ice on forehead, washing hands in cold water Treatment Target: Increase psychological flexibility (Acceptance, defusion, observer self/perspective-taking, present moment awareness, values and committed action) Teach mindfulness skills  Psychoeducation about cognitive fusion- Hand as thoughts metaphor  Increase awareness of  control vs. acceptance Reduce avoidance of certain emotions or situations Discuss "unhooking" and emphasize that thoughts and feelings do  not always lead to action Increasing self-knowledge  Getting in touch with the observant self  Values-guided behavioral change  Future Appointments  Date Time Provider Department Center  11/18/2022  2:10 PM Kathreen Cosier, LCSW [facility] None     Kathreen Cosier, LCSW

## 2022-11-18 ENCOUNTER — Ambulatory Visit: Payer: Self-pay | Admitting: Licensed Clinical Social Worker

## 2022-11-18 DIAGNOSIS — F411 Generalized anxiety disorder: Secondary | ICD-10-CM

## 2022-11-18 NOTE — Progress Notes (Signed)
Counselor/Therapist Progress Note  Patient ID: Jose George, MRN: [id number],    Date: 11/18/2022  Time Spent: 48 minutes    Treatment Type: Psychotherapy  Reported Symptoms:  Mood improving, anxiety, anxious thoughts   Mental Status Exam:  Appearance:   Casual and Neat     Behavior:  Appropriate, Sharing, and Motivated  Motor:  Normal  Speech/Language:   Clear and Coherent and Normal Rate  Affect:  Appropriate, Congruent, and Full Range  Mood:  normal  Thought process:  normal  Thought content:    WNL  Sensory/Perceptual disturbances:    WNL  Orientation:  oriented to person, place, time/date, and situation  Attention:  Good  Concentration:  Good  Memory:  WNL  Fund of knowledge:   Good  Insight:    Fair  Judgment:   Fair  Impulse Control:  Fair   Risk Assessment: Danger to Self:  No Self-injurious Behavior: No Danger to Others: No Duty to Warn:no Physical Aggression / Violence:No  Access to Firearms a concern: No  Gang Involvement:No   Subjective: Patient was receptive to feedback from [employer] and actively participated throughout the session. Patient reports overall stable mood, mild anxiety managed with coping skills and with medication. Patient is likely to benefit from future treatment because he remains motivated to decrease anxiety symptoms and symptoms of depressed mood.       Interventions: Cognitive Behavioral Therapy and client centered  Established psychological safety. LCSW met with patient to identify needs related to stressors and functioning, and assess and monitor for signs and symptoms of anxiety and depression, and assess safety. Checked in with patient regarding their week and processed with the patient how they have been doing since the last follow-up session. LCSW assisted patient in processing their thoughts and emotions regarding intimate partner dating relationships. LCSW provided support through active listening, validation of feelings, and  highlighted patient's strengths.  Diagnosis:   ICD-10-CM   1. Generalized anxiety disorder  F41.1      Plan: NEXT SESSION: Check on use of guided meditations. Teach core beliefs; and continue to identifying unhelpful thoughts.    Patient's goal of treatment is dealing with emotions, feelings, past problems, and going forward and processing these experiences to deal with them in a rational way.    Treatment Target: Understand the relationship between thoughts, emotions, and behaviors  Psychoeducation on CBTs model   Oriented the client to the therapeutic approach Teach the connection between thoughts, emotions, and behaviors  Enhance emotional awareness and discrimination of emotions  Understand thoughts, emotions, and behaviors  Treatment Target: Increase realistic balanced thinking -to learn how to replace thinking with thoughts that are more accurate or helpful Explore patient's thoughts, beliefs, automatic thoughts, assumptions  Identify and replace unhelpful thinking patterns (upsetting ideas, self-talk and mental images) Process distress and allow for emotional release  Questioning and challenging thoughts Cognitive reappraisal  Restructuring, Socratic questioning  Provided psychoeducation on core beliefs, explore, and assist patient in identifying core beliefs and modify underlying beliefs Treatment Target: Creating a life worth living  Values clarification   Self-care - nutrition, sleep, exercise  Mindfulness skills Interpersonal Effectiveness  Emotion Regulation Distress Tolerance Treatment Target: Increase coping skills to manage panic and panic attacks  Intentional breathing exercises 5 senses Grounding exercises - holding ice cube, ice on forehead, washing hands in cold water Treatment Target: Increase psychological flexibility (Acceptance, defusion, observer self/perspective-taking, present moment awareness, values and committed action) Teach mindfulness skills   Psychoeducation about cognitive fusion- Hand  as thoughts metaphor  Increase awareness of control vs. acceptance Reduce avoidance of certain emotions or situations Discuss "unhooking" and emphasize that thoughts and feelings do not always lead to action Increasing self-knowledge  Getting in touch with the observant self  Values-guided behavioral change  Future Appointments  Date Time Provider Department Center  12/02/2022  3:00 PM Kathreen Cosier, LCSW [facility] None     Kathreen Cosier, LCSW

## 2022-11-19 ENCOUNTER — Ambulatory Visit: Admit: 2022-11-19 | Discharge: 2022-11-20 | Payer: PRIVATE HEALTH INSURANCE

## 2022-11-19 MED ORDER — BICTEGRAVIR 50 MG-EMTRICITABINE 200 MG-TENOFOVIR ALAFENAM 25 MG TABLET
ORAL_TABLET | Freq: Every day | ORAL | 11 refills | 30 days | Status: CP
Start: 2022-11-19 — End: ?

## 2022-11-19 MED ORDER — ROSUVASTATIN 10 MG TABLET
ORAL_TABLET | Freq: Every day | ORAL | 3 refills | 90 days | Status: CP
Start: 2022-11-19 — End: 2023-11-14

## 2022-11-21 DIAGNOSIS — A539 Syphilis, unspecified: Principal | ICD-10-CM

## 2022-11-21 MED ORDER — DOXYCYCLINE MONOHYDRATE 100 MG CAPSULE
ORAL_CAPSULE | Freq: Two times a day (BID) | ORAL | 0 refills | 14 days | Status: CP
Start: 2022-11-21 — End: 2022-12-05

## 2022-11-24 DIAGNOSIS — A539 Syphilis, unspecified: Principal | ICD-10-CM

## 2022-11-24 MED ORDER — DOXYCYCLINE MONOHYDRATE 100 MG CAPSULE
ORAL_CAPSULE | Freq: Two times a day (BID) | ORAL | 0 refills | 14 days | Status: CP
Start: 2022-11-24 — End: 2022-12-08

## 2022-12-02 ENCOUNTER — Ambulatory Visit: Payer: Self-pay | Admitting: Licensed Clinical Social Worker

## 2022-12-02 DIAGNOSIS — F411 Generalized anxiety disorder: Secondary | ICD-10-CM

## 2022-12-02 NOTE — Progress Notes (Signed)
Counselor/Therapist Progress Note  Patient ID: Jose George, MRN: [id number],    Date: 12/02/2022  Time Spent: 45 minutes    Treatment Type: Psychotherapy  Reported Symptoms:  "pretty decent", low mood, mild anxiety, some improvement in sleep   Mental Status Exam:  Appearance:   Casual and Neat     Behavior:  Appropriate, Sharing, Motivated, and Assertive  Motor:  Normal  Speech/Language:   Clear and Coherent and Normal Rate  Affect:  Appropriate, Congruent, and Full Range  Mood:  normal  Thought process:  normal  Thought content:    WNL  Sensory/Perceptual disturbances:    WNL  Orientation:  oriented to person, place, time/date, situation, and day of week  Attention:  Good  Concentration:  Good  Memory:  WNL  Fund of knowledge:   Good  Insight:    Fair  Judgment:   Fair  Impulse Control:  Fair   Risk Assessment: Danger to Self:  No Self-injurious Behavior: No Danger to Others: No Duty to Warn:no Physical Aggression / Violence:No  Access to Firearms a concern: No  Gang Involvement:No   Subjective: Patient was engaged and cooperative throughout the session using time to discuss thoughts and feelings. Patient voices overall he has been okay, but notices struggles related to companionship and loneliness. Patient voices continued motivation for treatment and reports benefit of regular sessions.    Interventions: Cognitive Behavioral Therapy and Client centered  Established psychological safety. Checked in with patient regarding their week and assess symptoms and functioning. LCSW assisted patient in processing their thoughts and emotions related to challenges in romantic dating relationships.  LCSW highlighted unhelpful thoughts and reframed them to create more flexible thinking and explored options for patient to engage in other meaningful activities; encouraged patient to practice mindfulness meditation,  Provided support through active listening, validation of feelings,  and highlighted patient's strengths.   Diagnosis:   ICD-10-CM   1. Generalized anxiety disorder  F41.1      Plan:  NEXT SESSION: Check on use of guided meditations. Teach core beliefs; and continue to identifying unhelpful thoughts.    Patient's goal of treatment is dealing with emotions, feelings, past problems, and going forward and processing these experiences to deal with them in a rational way.    Treatment Target: Understand the relationship between thoughts, emotions, and behaviors  Psychoeducation on CBTs model   Oriented the client to the therapeutic approach Teach the connection between thoughts, emotions, and behaviors  Enhance emotional awareness and discrimination of emotions  Understand thoughts, emotions, and behaviors  Treatment Target: Increase realistic balanced thinking -to learn how to replace thinking with thoughts that are more accurate or helpful Explore patient's thoughts, beliefs, automatic thoughts, assumptions  Identify and replace unhelpful thinking patterns (upsetting ideas, self-talk and mental images) Process distress and allow for emotional release  Questioning and challenging thoughts Cognitive reappraisal  Restructuring, Socratic questioning  Provided psychoeducation on core beliefs, explore, and assist patient in identifying core beliefs and modify underlying beliefs Treatment Target: Creating a life worth living  Values clarification   Self-care - nutrition, sleep, exercise  Mindfulness skills Interpersonal Effectiveness  Emotion Regulation Distress Tolerance Treatment Target: Increase coping skills to manage panic and panic attacks  Intentional breathing exercises 5 senses Grounding exercises - holding ice cube, ice on forehead, washing hands in cold water Treatment Target: Increase psychological flexibility (Acceptance, defusion, observer self/perspective-taking, present moment awareness, values and committed action) Teach mindfulness skills   Psychoeducation about cognitive fusion- Hand as thoughts metaphor  Increase awareness of control vs. acceptance Reduce avoidance of certain emotions or situations Discuss "unhooking" and emphasize that thoughts and feelings do not always lead to action Increasing self-knowledge  Getting in touch with the observant self  Values-guided behavioral change  Future Appointments  Date Time Provider Department Center  12/09/2022  3:00 PM Kathreen Cosier, LCSW [facility] None  12/11/2022  8:15 AM [facility] PROVIDER [facility] None    Kathreen Cosier, LCSW

## 2022-12-09 ENCOUNTER — Ambulatory Visit: Payer: Self-pay | Admitting: Licensed Clinical Social Worker

## 2022-12-09 DIAGNOSIS — F411 Generalized anxiety disorder: Secondary | ICD-10-CM

## 2022-12-09 NOTE — Progress Notes (Signed)
Counselor/Therapist Progress Note  Patient ID: Jose George, MRN: [id number],    Date: 12/09/2022  Time Spent: 45 minutes    Treatment Type: Psychotherapy  Reported Symptoms:  Static symptoms- mild anxiety, intermittent low mood   Mental Status Exam:  Appearance:   Casual     Behavior:  Appropriate, Sharing, and Motivated  Motor:  Normal  Speech/Language:   Clear and Coherent and Normal Rate  Affect:  Appropriate, Congruent, and Full Range  Mood:  normal  Thought process:  normal  Thought content:    WNL  Sensory/Perceptual disturbances:    WNL  Orientation:  oriented to person, place, time/date, and situation  Attention:  Good  Concentration:  Good  Memory:  WNL  Fund of knowledge:   Good  Insight:    Fair  Judgment:   Fair  Impulse Control:  Fair   Risk Assessment: Danger to Self:  No Self-injurious Behavior: No Danger to Others: No Duty to Warn:no Physical Aggression / Violence:No  Access to Firearms a concern: No  Gang Involvement:No   Subjective: Patient was engaged and cooperative throughout the session using time to discuss thoughts and feelings. Patient reports he had a good weekend. He continues to report times of low with continued mild anxiety symptoms. Patient does report paying attention to his anxiety / noticing it and checking in with it. Patient reports continued motivation for treatment.   Interventions: Cognitive Behavioral Therapy and Client Centered    Established psychological safety. Checked in with patient regarding their week and processed with the patient how they have been doing since the last follow-up session. LCSW assisted patient in processing their thoughts and emotions regarding intimate partner relationship challenges and work challenges. LCSW reviewed activities patient has been engaging in that have helped his mood, discussed noticing and observing anxiety and managing it as it arises. Encouraged patient to seek medical advice for  concerns of symptoms that may be medically related - dizzy, brain fog.  Provided support through active listening, validation of feelings, and highlighted patient's strengths.  Diagnosis:   ICD-10-CM   1. Generalized anxiety disorder  F41.1      Plan: Patient's goal of treatment is dealing with emotions, feelings, past problems, and going forward and processing these experiences to deal with them in a rational way.    Treatment Target: Understand the relationship between thoughts, emotions, and behaviors  Psychoeducation on CBTs model   Oriented the client to the therapeutic approach Teach the connection between thoughts, emotions, and behaviors  Enhance emotional awareness and discrimination of emotions  Understand thoughts, emotions, and behaviors  Treatment Target: Increase realistic balanced thinking -to learn how to replace thinking with thoughts that are more accurate or helpful Explore patient's thoughts, beliefs, automatic thoughts, assumptions  Identify and replace unhelpful thinking patterns (upsetting ideas, self-talk and mental images) Process distress and allow for emotional release  Questioning and challenging thoughts Cognitive reappraisal  Restructuring, Socratic questioning  Provided psychoeducation on core beliefs, explore, and assist patient in identifying core beliefs and modify underlying beliefs Treatment Target: Creating a life worth living  Values clarification   Self-care - nutrition, sleep, exercise  Mindfulness skills Interpersonal Effectiveness  Emotion Regulation Distress Tolerance Treatment Target: Increase coping skills to manage panic and panic attacks  Intentional breathing exercises 5 senses Grounding exercises - holding ice cube, ice on forehead, washing hands in cold water Treatment Target: Increase psychological flexibility (Acceptance, defusion, observer self/perspective-taking, present moment awareness, values and committed action) Teach  mindfulness skills  Psychoeducation about cognitive fusion- Hand as thoughts metaphor  Increase awareness of control vs. acceptance Reduce avoidance of certain emotions or situations Discuss "unhooking" and emphasize that thoughts and feelings do not always lead to action Increasing self-knowledge  Getting in touch with the observant self  Values-guided behavioral change  Future Appointments  Date Time Provider Department Center  12/11/2022  8:15 AM [facility] PROVIDER [facility] None  12/16/2022  3:00 PM Kathreen Cosier, LCSW [facility] None    Kathreen Cosier, LCSW

## 2022-12-11 ENCOUNTER — Ambulatory Visit: Payer: Self-pay

## 2022-12-16 ENCOUNTER — Ambulatory Visit: Payer: Self-pay | Admitting: Licensed Clinical Social Worker

## 2022-12-16 DIAGNOSIS — F411 Generalized anxiety disorder: Secondary | ICD-10-CM

## 2022-12-16 NOTE — Progress Notes (Signed)
Counselor/Therapist Progress Note  Patient ID: Jose George, MRN: [id number],    Date: 12/16/2022  Time Spent: 46 minutes    Treatment Type: Psychotherapy  Reported Symptoms:  intermittent anxiety, panic symptoms seems to worsen in the middle of the week and taper off on the weekends  Mental Status Exam:  Appearance:   Casual     Behavior:  Appropriate and Sharing  Motor:  Normal  Speech/Language:   Clear and Coherent and Normal Rate  Affect:  Appropriate, Congruent, and Full Range  Mood:  normal  Thought process:  normal  Thought content:    WNL  Sensory/Perceptual disturbances:    WNL  Orientation:  oriented to person, place, time/date, and situation  Attention:  Good  Concentration:  Good  Memory:  WNL  Fund of knowledge:   Good  Insight:    Fair  Judgment:   Fair  Impulse Control:  Fair   Risk Assessment: Danger to Self:  No Self-injurious Behavior: No Danger to Others: No Duty to Warn:no Physical Aggression / Violence:No  Access to Firearms a concern: No  Gang Involvement:No   Subjective: Patient was engaged throughout the session using time to discuss thoughts and feelings. Patient voices intermittent panic / anxiety symptoms that he reports not knowing if it is truly panic and anxiety or medically related.  Patient was resistant to tracking these episodes but does report he will seek medical advice. Patient voices continued motivation for treatment.  Interventions: Cognitive Behavioral Therapy, Mindfulness Meditation, and client centered  Established psychological safety. LCSW met with patient to identify needs related to stressors and functioning, and assess and monitor for signs and symptoms of depression and anxiety , and assess safety. Checked in with patient regarding their week and processed with the patient how they have been doing since the last follow-up session. LCSW discussed self-care, increasing water intake, decreasing caffeine intake. LCSW assisted  patient in processing their thoughts and emotions regarding interpersonal relationships and encouraged him to journal about his anxious thoughts. LCSW encouraged patient to follow up with health care provider for any concerns related to possible medical issues. Provided support through active listening, validation of feelings, and highlighted patient's strengths.  Diagnosis:   ICD-10-CM   1. Generalized anxiety disorder  F41.1      Plan: Patient's goal of treatment is dealing with emotions, feelings, past problems, and going forward and processing these experiences to deal with them in a rational way.    Treatment Target: Understand the relationship between thoughts, emotions, and behaviors  Psychoeducation on CBTs model   Oriented the client to the therapeutic approach Teach the connection between thoughts, emotions, and behaviors  Enhance emotional awareness and discrimination of emotions  Understand thoughts, emotions, and behaviors  Treatment Target: Increase realistic balanced thinking -to learn how to replace thinking with thoughts that are more accurate or helpful Explore patient's thoughts, beliefs, automatic thoughts, assumptions  Identify and replace unhelpful thinking patterns (upsetting ideas, self-talk and mental images) Process distress and allow for emotional release  Questioning and challenging thoughts Cognitive reappraisal  Restructuring, Socratic questioning  Provided psychoeducation on core beliefs, explore, and assist patient in identifying core beliefs and modify underlying beliefs Treatment Target: Creating a life worth living  Values clarification   Self-care - nutrition, sleep, exercise  Mindfulness skills Interpersonal Effectiveness  Emotion Regulation Distress Tolerance Treatment Target: Increase coping skills to manage panic and panic attacks  Intentional breathing exercises 5 senses Grounding exercises - holding ice cube, ice on forehead, washing  hands in  cold water Treatment Target: Increase psychological flexibility (Acceptance, defusion, observer self/perspective-taking, present moment awareness, values and committed action) Teach mindfulness skills  Psychoeducation about cognitive fusion- Hand as thoughts metaphor  Increase awareness of control vs. acceptance Reduce avoidance of certain emotions or situations Discuss "unhooking" and emphasize that thoughts and feelings do not always lead to action Increasing self-knowledge  Getting in touch with the observant self  Values-guided behavioral change  Future Appointments  Date Time Provider Department Center  12/23/2022  3:00 PM Kathreen Cosier, LCSW [facility] None     Kathreen Cosier, LCSW

## 2022-12-23 ENCOUNTER — Ambulatory Visit: Payer: Self-pay | Admitting: Licensed Clinical Social Worker

## 2022-12-23 DIAGNOSIS — F411 Generalized anxiety disorder: Secondary | ICD-10-CM

## 2022-12-23 NOTE — Progress Notes (Signed)
Counselor/Therapist Progress Note  Patient ID: Jose George, MRN: [id number],    Date: 12/23/2022  Time Spent: 45 minutes    Treatment Type: Psychotherapy  Reported Symptoms:  overall "okay", down and depressed on his birthday; static anxiety symptoms  Mental Status Exam:  Appearance:   Casual     Behavior:  Appropriate, Sharing, and Resistant  Motor:  Normal  Speech/Language:   Clear and Coherent and Normal Rate  Affect:  Appropriate, Congruent, and Full Range  Mood:  normal  Thought process:  normal  Thought content:    WNL  Sensory/Perceptual disturbances:    WNL  Orientation:  oriented to person, place, time/date, and situation  Attention:  Good  Concentration:  Good  Memory:  WNL  Fund of knowledge:   Good  Insight:    Fair  Judgment:   Fair  Impulse Control:  Fair   Risk Assessment: Danger to Self:  No Self-injurious Behavior: No Danger to Others: No Duty to Warn:no Physical Aggression / Violence:No  Access to Firearms a concern: No  Gang Involvement:No   Subjective: Patient was engaged throughout the session using time to discuss thoughts and feelings. He reports no overall change in mood and anxiety symptoms, but did feel depressed on his birthday. He does report that the next day was nice though. Patient reported understanding but some resistance to therapeutic intervention thinking that it will not work for him. Patient voices continued motivation for treatment and scheduled follow up appointment.   Interventions: Cognitive Behavioral Therapy and Client centered  Established psychological safety. Checked in with patient regarding their week. LCSW assisted patient in processing their thoughts and emotions related to psychosocial stressors over the past week. Psychoeducation and brief therapeutic intervention/perspective taking / reframing thoughts / highlighting thoughts vs emotions regarding coping with relationship challenges. LCSW reviewed noticing and  observing thoughts and emotions and encouraged patient to increase self-care and engagement in behaviors that align with his values. Provided support through active listening, validation of feelings, and highlighted patient's strengths.   Diagnosis:   ICD-10-CM   1. Generalized anxiety disorder  F41.1      Plan: Patient's goal of treatment is dealing with emotions, feelings, past problems, and going forward and processing these experiences to deal with them in a rational way.    Treatment Target: Understand the relationship between thoughts, emotions, and behaviors  Psychoeducation on CBTs model   Oriented the client to the therapeutic approach Teach the connection between thoughts, emotions, and behaviors  Enhance emotional awareness and discrimination of emotions  Understand thoughts, emotions, and behaviors  Treatment Target: Increase realistic balanced thinking -to learn how to replace thinking with thoughts that are more accurate or helpful Explore patient's thoughts, beliefs, automatic thoughts, assumptions  Identify and replace unhelpful thinking patterns (upsetting ideas, self-talk and mental images) Process distress and allow for emotional release  Questioning and challenging thoughts Cognitive reappraisal  Restructuring, Socratic questioning  Provided psychoeducation on core beliefs, explore, and assist patient in identifying core beliefs and modify underlying beliefs Treatment Target: Creating a life worth living  Values clarification   Self-care - nutrition, sleep, exercise  Mindfulness skills Interpersonal Effectiveness  Emotion Regulation Distress Tolerance Treatment Target: Increase coping skills to manage panic and panic attacks  Intentional breathing exercises 5 senses Grounding exercises - holding ice cube, ice on forehead, washing hands in cold water Treatment Target: Increase psychological flexibility (Acceptance, defusion, observer self/perspective-taking,  present moment awareness, values and committed action) Teach mindfulness skills  Psychoeducation about cognitive  fusion- Hand as thoughts metaphor  Increase awareness of control vs. acceptance Reduce avoidance of certain emotions or situations Discuss "unhooking" and emphasize that thoughts and feelings do not always lead to action Increasing self-knowledge  Getting in touch with the observant self  Values-guided behavioral change  Future Appointments  Date Time Provider Department Center  12/30/2022  3:00 PM Kathreen Cosier, LCSW [facility] None     Kathreen Cosier, LCSW

## 2022-12-30 ENCOUNTER — Ambulatory Visit: Payer: Self-pay | Admitting: Licensed Clinical Social Worker

## 2022-12-30 DIAGNOSIS — F411 Generalized anxiety disorder: Secondary | ICD-10-CM

## 2022-12-30 NOTE — Progress Notes (Signed)
Counselor/Therapist Progress Note  Patient ID: Jose George, MRN: [id number],    Date: 12/30/2022  Time Spent: 45 minutes    Treatment Type: Psychotherapy  Reported Symptoms:  anxiety, anxious thoughts, intermittent sleep disturbance   Mental Status Exam:  Appearance:   Casual and Neat     Behavior:  Appropriate and Sharing  Motor:  Normal  Speech/Language:   Clear and Coherent and Normal Rate  Affect:  Appropriate and Congruent  Mood:  normal  Thought process:  normal  Thought content:    WNL  Sensory/Perceptual disturbances:    WNL  Orientation:  oriented to person, place, time/date, and situation  Attention:  Good  Concentration:  Good  Memory:  WNL  Fund of knowledge:   Good  Insight:    Fair  Judgment:   Fair  Impulse Control:  Fair   Risk Assessment: Danger to Self:  No Self-injurious Behavior: No Danger to Others: No Duty to Warn:no Physical Aggression / Violence:No  Access to Firearms a concern: No  Gang Involvement:No   Subjective: Patient was engaged and cooperative throughout the session using time to discuss thoughts and feelings related to current psychosocial stressors and unresolved issues related to family. Patient reports overall static symptoms, with some increased anxiety prior to visiting with family and also reports some troubles sleeping over the past week. Patient was receptive to feedback from LCSW. Patient voices continued motivation for treatment.  Interventions: Cognitive Behavioral Therapy and Client Centered  Established psychological safety. Checked in with patient regarding their week.The clinician processed with the patient how they have been doing since the last follow-up session. LCSW assisted patient in processing their thoughts and emotions about unresolved issues with his family of origin and extended family; challenges related to ex-boyfriend and a desire to start over and move back home. Discussed sleep hygiene and encouraged  patient to listen to guided meditations. Provided support through active listening, validation of feelings, and highlighted patient's strengths.   Diagnosis:   ICD-10-CM   1. Generalized anxiety disorder  F41.1      Plan: Patient's goal of treatment is dealing with emotions, feelings, past problems, and going forward and processing these experiences to deal with them in a rational way.    Treatment Target: Understand the relationship between thoughts, emotions, and behaviors  Psychoeducation on CBTs model   Oriented the client to the therapeutic approach Teach the connection between thoughts, emotions, and behaviors  Enhance emotional awareness and discrimination of emotions  Understand thoughts, emotions, and behaviors  Treatment Target: Increase realistic balanced thinking -to learn how to replace thinking with thoughts that are more accurate or helpful Explore patient's thoughts, beliefs, automatic thoughts, assumptions  Identify and replace unhelpful thinking patterns (upsetting ideas, self-talk and mental images) Process distress and allow for emotional release  Questioning and challenging thoughts Cognitive reappraisal  Restructuring, Socratic questioning  Provided psychoeducation on core beliefs, explore, and assist patient in identifying core beliefs and modify underlying beliefs Treatment Target: Creating a life worth living  Values clarification   Self-care - nutrition, sleep, exercise  Mindfulness skills Interpersonal Effectiveness  Emotion Regulation Distress Tolerance Treatment Target: Increase coping skills to manage panic and panic attacks  Intentional breathing exercises 5 senses Grounding exercises - holding ice cube, ice on forehead, washing hands in cold water Treatment Target: Increase psychological flexibility (Acceptance, defusion, observer self/perspective-taking, present moment awareness, values and committed action) Teach mindfulness skills   Psychoeducation about cognitive fusion- Hand as thoughts metaphor  Increase awareness of control  vs. acceptance Reduce avoidance of certain emotions or situations Discuss "unhooking" and emphasize that thoughts and feelings do not always lead to action Increasing self-knowledge  Getting in touch with the observant self  Values-guided behavioral change  Future Appointments  Date Time Provider Department Center  01/06/2023  3:00 PM Kathreen Cosier, LCSW [facility] None    Kathreen Cosier, LCSW

## 2023-01-06 ENCOUNTER — Ambulatory Visit: Payer: Self-pay | Admitting: Licensed Clinical Social Worker

## 2023-01-06 DIAGNOSIS — F411 Generalized anxiety disorder: Secondary | ICD-10-CM

## 2023-01-06 NOTE — Progress Notes (Signed)
Counselor/Therapist Progress Note  Patient ID: Jose George, MRN: [id number],    Date: 01/06/2023  Time Spent: 45 minutes    Treatment Type: Psychotherapy  Reported Symptoms:  moments of grief, sadness   Mental Status Exam:  Appearance:   Casual     Behavior:  Appropriate, Sharing, and Motivated  Motor:  Normal  Speech/Language:   Clear and Coherent and Normal Rate  Affect:  Appropriate, Congruent, and Full Range  Mood:  normal  Thought process:  normal  Thought content:    WNL  Sensory/Perceptual disturbances:    WNL  Orientation:  oriented to person, place, time/date, and situation  Attention:  Good  Concentration:  Good  Memory:  WNL  Fund of knowledge:   Good  Insight:    Fair  Judgment:   Fair  Impulse Control:  Fair   Risk Assessment: Danger to Self:  No Self-injurious Behavior: No Danger to Others: No Duty to Warn:no Physical Aggression / Violence:No  Access to Firearms a concern: No  Gang Involvement:No   Subjective: Patient was engaged and cooperative throughout the session using time to discuss thoughts and feelings. Patient was receptive to feedback from LCSW. He reports overall okay mood with some grief and sadness related to anniversary of loss of friend last year at this time. Patient voices continued motivation for treatment.   Interventions: Cognitive Behavioral Therapy and Client Centered  Established psychological safety. Checked in with patient regarding their week and processed with the patient how they have been doing since the last follow-up session. LCSW normalized patient's experience of grief and provided psychoeducation on grief and loss. LCSW assisted patient in processing their thoughts and emotions regarding ongoing challenges related to intimate partner relationships / ex-boyfriend. LCSW reviewed opposite emotion, encouraged patient to increase intentional use of self-care. Provided support through active listening, validation of feelings,  and highlighted patient's strengths.  Diagnosis:   ICD-10-CM   1. Generalized anxiety disorder  F41.1      Plan:  Patient's goal of treatment is dealing with emotions, feelings, past problems, and going forward and processing these experiences to deal with them in a rational way.    Treatment Target: Understand the relationship between thoughts, emotions, and behaviors  Psychoeducation on CBTs model   Oriented the client to the therapeutic approach Teach the connection between thoughts, emotions, and behaviors  Enhance emotional awareness and discrimination of emotions  Understand thoughts, emotions, and behaviors  Treatment Target: Increase realistic balanced thinking -to learn how to replace thinking with thoughts that are more accurate or helpful Explore patient's thoughts, beliefs, automatic thoughts, assumptions  Identify and replace unhelpful thinking patterns (upsetting ideas, self-talk and mental images) Process distress and allow for emotional release  Questioning and challenging thoughts Cognitive reappraisal  Restructuring, Socratic questioning  Provided psychoeducation on core beliefs, explore, and assist patient in identifying core beliefs and modify underlying beliefs Treatment Target: Creating a life worth living  Values clarification   Self-care - nutrition, sleep, exercise  Mindfulness skills Interpersonal Effectiveness  Emotion Regulation Distress Tolerance Treatment Target: Increase coping skills to manage panic and panic attacks  Intentional breathing exercises 5 senses Grounding exercises - holding ice cube, ice on forehead, washing hands in cold water Treatment Target: Increase psychological flexibility (Acceptance, defusion, observer self/perspective-taking, present moment awareness, values and committed action) Teach mindfulness skills  Psychoeducation about cognitive fusion- Hand as thoughts metaphor  Increase awareness of control vs. acceptance Reduce  avoidance of certain emotions or situations Discuss "unhooking" and emphasize that  thoughts and feelings do not always lead to action Increasing self-knowledge  Getting in touch with the observant self  Values-guided behavioral change  Future Appointments  Date Time Provider Department Center  01/13/2023  3:00 PM Kathreen Cosier, LCSW [facility] None     Kathreen Cosier, LCSW

## 2023-01-13 ENCOUNTER — Ambulatory Visit: Payer: Self-pay | Admitting: Licensed Clinical Social Worker

## 2023-01-13 DIAGNOSIS — F411 Generalized anxiety disorder: Secondary | ICD-10-CM

## 2023-01-13 NOTE — Progress Notes (Signed)
Counselor/Therapist Progress Note  Patient ID: Jeziah Landgren, MRN: [id number],    Date: 01/13/2023  Time Spent: 42 minutes    Treatment Type: Psychotherapy  Reported Symptoms:  Stable mood. Mild Anxiety, sleep disturbance.   Mental Status Exam:  Appearance:   Casual     Behavior:  Appropriate and Sharing  Motor:  Normal  Speech/Language:   Clear and Coherent and Normal Rate  Affect:  Appropriate, Congruent, and Full Range  Mood:  normal  Thought process:  normal  Thought content:    WNL  Sensory/Perceptual disturbances:    WNL  Orientation:  oriented to person, place, time/date, situation, and day of week  Attention:  Good  Concentration:  Good  Memory:  WNL  Fund of knowledge:   Good  Insight:    Fair  Judgment:   Fair  Impulse Control:  Fair   Risk Assessment: Danger to Self:  No Self-injurious Behavior: No Danger to Others: No Duty to Warn:no Physical Aggression / Violence:No  Access to Firearms a concern: No  Gang Involvement:No   Subjective: Patient was engaged and cooperative throughout the session using time to discuss thoughts and feelings. Patient reports that his mood has been "pretty good". Patient voices continued motivation for treatment and understanding of how relationship challenges affect his mood. Patient is likely to benefit from future treatment because he remains motivated to decrease symptoms and improve functioning.    Interventions: Cognitive Behavioral Therapy and Client centered  Established psychological safety. Checked in with patient regarding their week. LCSW assisted patient in processing their thoughts and emotions about recent interactions with ex-partner, offered reflective listening and compassionate presence. LCSW discussed anxiety and reviewed mindfulness practices and self-care. Provided support through active listening, validation of feelings, and highlighted patient's strengths.    Diagnosis:   ICD-10-CM   1. Generalized anxiety  disorder  F41.1      Plan: Patient's goal of treatment is dealing with emotions, feelings, past problems, and going forward and processing these experiences to deal with them in a rational way.    Treatment Target: Understand the relationship between thoughts, emotions, and behaviors  Psychoeducation on CBTs model   Oriented the client to the therapeutic approach Teach the connection between thoughts, emotions, and behaviors  Enhance emotional awareness and discrimination of emotions  Understand thoughts, emotions, and behaviors  Treatment Target: Increase realistic balanced thinking -to learn how to replace thinking with thoughts that are more accurate or helpful Explore patient's thoughts, beliefs, automatic thoughts, assumptions  Identify and replace unhelpful thinking patterns (upsetting ideas, self-talk and mental images) Process distress and allow for emotional release  Questioning and challenging thoughts Cognitive reappraisal  Restructuring, Socratic questioning  Provided psychoeducation on core beliefs, explore, and assist patient in identifying core beliefs and modify underlying beliefs Treatment Target: Creating a life worth living  Values clarification   Self-care - nutrition, sleep, exercise  Mindfulness skills Interpersonal Effectiveness  Emotion Regulation Distress Tolerance Treatment Target: Increase coping skills to manage panic and panic attacks  Intentional breathing exercises 5 senses Grounding exercises - holding ice cube, ice on forehead, washing hands in cold water Treatment Target: Increase psychological flexibility (Acceptance, defusion, observer self/perspective-taking, present moment awareness, values and committed action) Teach mindfulness skills  Psychoeducation about cognitive fusion- Hand as thoughts metaphor  Increase awareness of control vs. acceptance Reduce avoidance of certain emotions or situations Discuss "unhooking" and emphasize that thoughts  and feelings do not always lead to action Increasing self-knowledge  Getting in touch with the  observant self  Values-guided behavioral change  Future Appointments  Date Time Provider Department Center  01/27/2023  3:00 PM Kathreen Cosier, LCSW [facility] None      Kathreen Cosier, [place]

## 2023-01-19 ENCOUNTER — Ambulatory Visit: Admit: 2023-01-19 | Payer: PRIVATE HEALTH INSURANCE

## 2023-01-27 ENCOUNTER — Ambulatory Visit: Payer: Self-pay | Admitting: Licensed Clinical Social Worker

## 2023-01-27 DIAGNOSIS — F411 Generalized anxiety disorder: Secondary | ICD-10-CM

## 2023-01-27 NOTE — Progress Notes (Signed)
Counselor/Therapist Progress Note  Patient ID: Jose George, MRN: [id number],    Date: 01/27/2023  Time Spent: 43 minutes    Treatment Type: Psychotherapy  Reported Symptoms:  No panic attacks; mild anxiety, anxious thoughts; mood stable  Mental Status Exam:  Appearance:   Casual     Behavior:  Appropriate, Sharing, and pleasant   Motor:  Normal  Speech/Language:   Clear and Coherent and Normal Rate  Affect:  Appropriate, Congruent, and Full Range  Mood:  normal  Thought process:  normal  Thought content:    WNL  Sensory/Perceptual disturbances:    WNL  Orientation:  oriented to person, place, time/date, situation, and day of week  Attention:  Good  Concentration:  Good  Memory:  WNL  Fund of knowledge:   Good  Insight:    Fair  Judgment:   Fair  Impulse Control:  Fair   Risk Assessment: Danger to Self:  No Self-injurious Behavior: No Danger to Others: No Duty to Warn:no Physical Aggression / Violence:No  Access to Firearms a concern: No  Gang Involvement:No   Subjective: Patient was engaged and cooperative throughout the session using time to discuss discuss thoughts and feelings. Patient reports that his mood has been stable and anxiety has been mild, denies any panic attacks. He reports continued motivation for treatment and reports benefit of regular sessions.     Interventions: Cognitive Behavioral Therapy and Client Centered  Established psychological safety. Checked in with patient regarding their week. Clinician met with patient to identify needs related to stressors and functioning, and assess and monitor for signs and symptoms of anxiety and depressed mood, and assess safety. LCSW explored and assisted patient in processing and their thoughts and emotions regarding their experiences of both ruminating about the past (childhood) and increasingly finding comfort in things from childhood, and regarding decisions about relocating. LCSW normalized patient's  experiences, reviewed mindfulness, discussed writing a pros and cons list for moving back to area where he grew up. Provided support through active listening, validation of feelings, and highlighted patient's strengths.   Diagnosis:   ICD-10-CM   1. Generalized anxiety disorder  F41.1      Plan: Patient's goal of treatment is dealing with emotions, feelings, past problems, and going forward and processing these experiences to deal with them in a rational way.    Treatment Target: Understand the relationship between thoughts, emotions, and behaviors  Psychoeducation on CBTs model   Oriented the client to the therapeutic approach Teach the connection between thoughts, emotions, and behaviors  Enhance emotional awareness and discrimination of emotions  Understand thoughts, emotions, and behaviors  Treatment Target: Increase realistic balanced thinking -to learn how to replace thinking with thoughts that are more accurate or helpful Explore patient's thoughts, beliefs, automatic thoughts, assumptions  Identify and replace unhelpful thinking patterns (upsetting ideas, self-talk and mental images) Process distress and allow for emotional release  Questioning and challenging thoughts Cognitive reappraisal  Restructuring, Socratic questioning  Provided psychoeducation on core beliefs, explore, and assist patient in identifying core beliefs and modify underlying beliefs Treatment Target: Creating a life worth living  Values clarification   Self-care - nutrition, sleep, exercise  Mindfulness skills Interpersonal Effectiveness  Emotion Regulation Distress Tolerance Treatment Target: Increase coping skills to manage panic and panic attacks  Intentional breathing exercises 5 senses Grounding exercises - holding ice cube, ice on forehead, washing hands in cold water Treatment Target: Increase psychological flexibility (Acceptance, defusion, observer self/perspective-taking, present moment  awareness, values and committed action)  Teach mindfulness skills  Psychoeducation about cognitive fusion- Hand as thoughts metaphor  Increase awareness of control vs. acceptance Reduce avoidance of certain emotions or situations Discuss "unhooking" and emphasize that thoughts and feelings do not always lead to action Increasing self-knowledge  Getting in touch with the observant self  Values-guided behavioral change  Future Appointments  Date Time Provider Department Center  02/10/2023  3:00 PM Kathreen Cosier, LCSW [facility] None      Kathreen Cosier, LCSW

## 2023-02-10 ENCOUNTER — Ambulatory Visit: Payer: Self-pay | Admitting: Licensed Clinical Social Worker

## 2023-02-10 DIAGNOSIS — F411 Generalized anxiety disorder: Secondary | ICD-10-CM

## 2023-02-10 NOTE — Progress Notes (Signed)
Counselor/Therapist Progress Note  Patient ID: Jose George, MRN: [id number],    Date: 02/10/2023  Time Spent: 45 minutes    Treatment Type: Psychotherapy  Reported Symptoms:  Anxiety, nervousness, anxious, overwhelmed, sleep disturbance   Mental Status Exam:  Appearance:   Casual and Neat     Behavior:  Appropriate, Sharing, and Motivated  Motor:  Normal  Speech/Language:   Clear and Coherent and Normal Rate  Affect:  Appropriate, Congruent, and Full Range  Mood:  normal  Thought process:  normal  Thought content:    WNL  Sensory/Perceptual disturbances:    WNL  Orientation:  oriented to person, place, time/date, situation, and day of week  Attention:  Good  Concentration:  Good  Memory:  WNL  Fund of knowledge:   Good  Insight:    Fair  Judgment:   Fair  Impulse Control:  Fair   Risk Assessment: Danger to Self:  No Self-injurious Behavior: No Danger to Others: No Duty to Warn:no Physical Aggression / Violence:No  Access to Firearms a concern: No  Gang Involvement:No   Subjective: Patient was engaged and cooperative throughout the session using time effectively to discuss thoughts and feelings. He reports that he has had a lot going on and has had ups and downs with anxiety. Patient voices continued motivation for treatment.   Interventions: Cognitive Behavioral Therapy and Client Centered  Established psychological safety. LCSW processed with the patient how they have been doing since the last follow-up session. LCSW provided psychoeducation and therapeutic intervention regarding coping with anxiety, including decreasing coffee/caffeine, use of mindfulness meditation, playing games, engaging in learning or focusing on something meaningful. Provided support through active listening, validation of feelings, and highlighted patient's strengths.   Diagnosis:   ICD-10-CM   1. Generalized anxiety disorder  F41.1      Plan: Patient's goal of treatment is dealing with  emotions, feelings, past problems, and going forward and processing these experiences to deal with them in a rational way.    Treatment Target: Understand the relationship between thoughts, emotions, and behaviors  Psychoeducation on CBTs model   Oriented the client to the therapeutic approach Teach the connection between thoughts, emotions, and behaviors  Enhance emotional awareness and discrimination of emotions  Understand thoughts, emotions, and behaviors  Treatment Target: Increase realistic balanced thinking -to learn how to replace thinking with thoughts that are more accurate or helpful Explore patient's thoughts, beliefs, automatic thoughts, assumptions  Identify and replace unhelpful thinking patterns (upsetting ideas, self-talk and mental images) Process distress and allow for emotional release  Questioning and challenging thoughts Cognitive reappraisal  Restructuring, Socratic questioning  Provided psychoeducation on core beliefs, explore, and assist patient in identifying core beliefs and modify underlying beliefs Treatment Target: Creating a life worth living  Values clarification   Self-care - nutrition, sleep, exercise  Mindfulness skills Interpersonal Effectiveness  Emotion Regulation Distress Tolerance Treatment Target: Increase coping skills to manage panic and panic attacks  Intentional breathing exercises 5 senses Grounding exercises - holding ice cube, ice on forehead, washing hands in cold water Treatment Target: Increase psychological flexibility (Acceptance, defusion, observer self/perspective-taking, present moment awareness, values and committed action) Teach mindfulness skills  Psychoeducation about cognitive fusion- Hand as thoughts metaphor  Increase awareness of control vs. acceptance Reduce avoidance of certain emotions or situations Discuss "unhooking" and emphasize that thoughts and feelings do not always lead to action Increasing self-knowledge   Getting in touch with the observant self  Values-guided behavioral change  Future Appointments  Date Time Provider Department Center  02/24/2023  3:00 PM Kathreen Cosier, LCSW [facility] None    Kathreen Cosier, [place]

## 2023-02-24 ENCOUNTER — Ambulatory Visit: Payer: Self-pay | Admitting: Licensed Clinical Social Worker

## 2023-02-24 DIAGNOSIS — F411 Generalized anxiety disorder: Secondary | ICD-10-CM

## 2023-02-24 NOTE — Progress Notes (Signed)
Counselor/Therapist Progress Note  Patient ID: Jose George, MRN: [id number],    Date: 02/24/2023  Time Spent: 45 minutes    Treatment Type: Psychotherapy  Reported Symptoms:  "pretty decent' few anxiety "episodes"   Mental Status Exam:  Appearance:   Casual and Neat     Behavior:  Appropriate, Sharing, and Motivated  Motor:  Normal  Speech/Language:   Clear and Coherent and Normal Rate  Affect:  Appropriate, Congruent, and Full Range  Mood:  normal  Thought process:  normal  Thought content:    WNL  Sensory/Perceptual disturbances:    WNL  Orientation:  oriented to person, place, time/date, situation, and day of week  Attention:  Good  Concentration:  Good  Memory:  WNL  Fund of knowledge:   Good  Insight:    Fair  Judgment:   Fair  Impulse Control:  Fair   Risk Assessment: Danger to Self:  No Self-injurious Behavior: No Danger to Others: No Duty to Warn:no Physical Aggression / Violence:No  Access to Firearms a concern: No  Gang Involvement:No    Subjective: Patient was engaged and cooperative throughout the session using time effectively to discuss thoughts and feelings. Patient voices a few episodes of anxiety over the past two weeks but otherwise things have been "pretty decent".  Patient reports increased awareness of what's leading to anxiety and is better at identifying the anxiety.  Patient was receptive to feedback and intervention from LCSW.     Interventions: Cognitive Behavioral Therapy and Client Centered  Established psychological safety. LCSW met with patient to identify needs related to stressors and functioning, and assess and monitor for signs and symptoms of anxiety and depressed mood, and assess safety. Checked in with patient regarding their week and processed with the patient how they have been doing since the last follow-up session. LCSW assisted patient in processing their thoughts and emotions regarding phase of life challenges. LCSW and  patient discussed values living and increasing self-care. Provided support through active listening, validation of feelings, and highlighted patient's strengths.   Diagnosis:   ICD-10-CM   1. Generalized anxiety disorder  F41.1      Plan: Patient's goal of treatment is dealing with emotions, feelings, past problems, and going forward and processing these experiences to deal with them in a rational way.    Treatment Target: Understand the relationship between thoughts, emotions, and behaviors  Psychoeducation on CBTs model   Oriented the client to the therapeutic approach Teach the connection between thoughts, emotions, and behaviors  Enhance emotional awareness and discrimination of emotions  Understand thoughts, emotions, and behaviors  Treatment Target: Increase realistic balanced thinking -to learn how to replace thinking with thoughts that are more accurate or helpful Explore patient's thoughts, beliefs, automatic thoughts, assumptions  Identify and replace unhelpful thinking patterns (upsetting ideas, self-talk and mental images) Process distress and allow for emotional release  Questioning and challenging thoughts Cognitive reappraisal  Restructuring, Socratic questioning  Provided psychoeducation on core beliefs, explore, and assist patient in identifying core beliefs and modify underlying beliefs Treatment Target: Creating a life worth living  Values clarification   Self-care - nutrition, sleep, exercise  Mindfulness skills Interpersonal Effectiveness  Emotion Regulation Distress Tolerance Treatment Target: Increase coping skills to manage panic and panic attacks  Intentional breathing exercises 5 senses Grounding exercises - holding ice cube, ice on forehead, washing hands in cold water Treatment Target: Increase psychological flexibility (Acceptance, defusion, observer self/perspective-taking, present moment awareness, values and committed action) Teach mindfulness  skills  Psychoeducation about cognitive fusion- Hand as thoughts metaphor  Increase awareness of control vs. acceptance Reduce avoidance of certain emotions or situations Discuss "unhooking" and emphasize that thoughts and feelings do not always lead to action Increasing self-knowledge  Getting in touch with the observant self  Values-guided behavioral change  Future Appointments  Date Time Provider Department Center  03/10/2023  3:00 PM Kathreen Cosier, LCSW [facility] None     Kathreen Cosier, LCSW

## 2023-02-25 ENCOUNTER — Encounter: Payer: Self-pay | Admitting: *Deleted

## 2023-02-25 ENCOUNTER — Emergency Department
Admission: EM | Admit: 2023-02-25 | Discharge: 2023-02-25 | Disposition: A | Discharge status: Home / Self Care | Payer: BC Managed Care – PPO | Attending: Emergency Medicine | Admitting: Emergency Medicine

## 2023-02-25 ENCOUNTER — Other Ambulatory Visit: Payer: Self-pay

## 2023-02-25 DIAGNOSIS — R52 Pain, unspecified: Secondary | ICD-10-CM | POA: Diagnosis not present

## 2023-02-25 DIAGNOSIS — W57XXXA Bitten or stung by nonvenomous insect and other nonvenomous arthropods, initial encounter: Secondary | ICD-10-CM

## 2023-02-25 DIAGNOSIS — Z20822 Contact with and (suspected) exposure to covid-19: Secondary | ICD-10-CM | POA: Insufficient documentation

## 2023-02-25 DIAGNOSIS — S30861A Insect bite (nonvenomous) of abdominal wall, initial encounter: Secondary | ICD-10-CM | POA: Diagnosis not present

## 2023-02-25 LAB — SARS CORONAVIRUS 2 BY RT PCR: SARS Coronavirus 2 by RT PCR: NEGATIVE

## 2023-02-25 MED ORDER — DOXYCYCLINE HYCLATE 100 MG PO TABS
100.0000 mg | ORAL_TABLET | Freq: Once | ORAL | Status: AC
  Administered 2023-02-25: 100 mg via ORAL
  Filled 2023-02-25: qty 1

## 2023-02-25 MED ORDER — DOXYCYCLINE HYCLATE 100 MG PO TABS: 100.0000 mg | ORAL_TABLET | Freq: Two times a day (BID) | ORAL | 0 refills | Status: AC

## 2023-02-25 NOTE — ED Triage Notes (Signed)
Pt reporting he was bit by a tick on Sunday on the lower left abdomen. He then started having flu like symptoms, headache and sinus congestion. Denies fevers. Small wound to the left lower abdomen with small amount of redness around the area noted.

## 2023-02-25 NOTE — ED Provider Notes (Signed)
   [facility] Provider Note    Event Date/Time   First MD Initiated Contact with Patient 02/25/23 2026     (approximate)   History   Insect Bite   HPI  Allie Kubacki is a 52 y.o. male who presents with a tick bite to his abdominal wall.  Shortly after he developed body aches, congestion     Physical Exam   Triage Vital Signs: ED Triage Vitals  Encounter Vitals Group     BP 02/25/23 1959 (!) 145/94     Systolic BP Percentile --      Diastolic BP Percentile --      Pulse Rate 02/25/23 1959 78     Resp 02/25/23 1959 18     Temp 02/25/23 1959 98.5 F (36.9 C)     Temp Source 02/25/23 1959 Oral     SpO2 02/25/23 1959 98 %     Weight --      Height --      Head Circumference --      Peak Flow --      Pain Score 02/25/23 1959 6     Pain Loc --      Pain Education --      Exclude from Growth Chart --     Most recent vital signs: Vitals:   02/25/23 1959  BP: (!) 145/94  Pulse: 78  Resp: 18  Temp: 98.5 F (36.9 C)  SpO2: 98%     General: Awake, no distress.  CV:  Good peripheral perfusion.  Resp:  Normal effort.  Abd:  No distention.  Small area of redness to the left lower abdominal wall, no erythema migrans rash Other:     ED Results / Procedures / Treatments   Labs (all labs ordered are listed, but only abnormal results are displayed) Labs Reviewed  SARS CORONAVIRUS 2 BY RT PCR     EKG     RADIOLOGY     PROCEDURES:  Critical Care performed:   Procedures   MEDICATIONS ORDERED IN ED: Medications  doxycycline (VIBRA-TABS) tablet 100 mg (100 mg Oral Given 02/25/23 2042)     IMPRESSION / MDM / ASSESSMENT AND PLAN / ED COURSE  I reviewed the triage vital signs and the nursing notes. Patient's presentation is most consistent with acute complicated illness / injury requiring diagnostic workup.  Differential includes Lyme disease versus viral illness possibly COVID  Will start on doxycycline, if COVID test  positive patient will discontinue        FINAL CLINICAL IMPRESSION(S) / ED DIAGNOSES   Final diagnoses:  Tick bite of abdominal wall, initial encounter     Rx / DC Orders   ED Discharge Orders          Ordered    doxycycline (VIBRA-TABS) 100 MG tablet  2 times daily        02/25/23 2038             Note:  This document was prepared using Dragon voice recognition software and may include unintentional dictation errors.   Jene Every, MD 02/25/23 2128

## 2023-03-10 ENCOUNTER — Ambulatory Visit: Payer: Self-pay | Admitting: Licensed Clinical Social Worker

## 2023-03-10 DIAGNOSIS — F411 Generalized anxiety disorder: Secondary | ICD-10-CM

## 2023-03-10 NOTE — Progress Notes (Signed)
Counselor/Therapist Progress Note  Patient ID: Jose George, MRN: [id number],    Date: 03/10/2023  Time Spent: 47 minutes    Treatment Type: Individual Therapy  Reported Symptoms:  Overall stable mood  Mental Status Exam:  Appearance:   Casual     Behavior:  Appropriate, Sharing, and Motivated  Motor:  Normal  Speech/Language:   Clear and Coherent and Normal Rate  Affect:  Appropriate, Congruent, and Full Range  Mood:  normal  Thought process:  normal  Thought content:    WNL  Sensory/Perceptual disturbances:    WNL  Orientation:  oriented to person, place, time/date, situation, and day of week  Attention:  Good  Concentration:  Good  Memory:  WNL  Fund of knowledge:   Good  Insight:    Fair  Judgment:   Fair  Impulse Control:  Fair   Risk Assessment: Danger to Self:  No Self-injurious Behavior: No Danger to Others: No Duty to Warn:no Physical Aggression / Violence:No  Access to Firearms a concern: No  Gang Involvement:No   Subjective: Patient was receptive to feedback and intervention from LCSW and actively and effectively participated throughout the session. Patient reports a lot more "ups" since last session and that overall his mood has been good. Patient reports benefit from regular sessions and reports continued motivation for treatment.   Interventions: Cognitive Behavioral Therapy and Client Centered  Established psychological safety. Checked in with patient regarding their week. LCSW processed with the patient how they have been doing since the last follow-up session. LCSW provided supportive space for patient to processes thoughts and feeling about current dating interactions and unresolved issues related to past relationships. LCSW encouraged patient to journal these experiences and bring to next session. LCSW also checked - in with patient to discuss the need to review treatment plan at next session. Provided support through active listening, validation of  feelings, and highlighted patient's strengths.  Diagnosis:   ICD-10-CM   1. Generalized anxiety disorder  F41.1      Plan:  NEXT SESSION: review homework - patient to journal about unresolved issues related to previous romantic relationship. And review treatment plan at next session.   Patient's goal of treatment is dealing with emotions, feelings, past problems, and going forward and processing these experiences to deal with them in a rational way.    Treatment Target: Understand the relationship between thoughts, emotions, and behaviors  Psychoeducation on CBTs model   Oriented the client to the therapeutic approach Teach the connection between thoughts, emotions, and behaviors  Enhance emotional awareness and discrimination of emotions  Understand thoughts, emotions, and behaviors  Treatment Target: Increase realistic balanced thinking -to learn how to replace thinking with thoughts that are more accurate or helpful Explore patient's thoughts, beliefs, automatic thoughts, assumptions  Identify and replace unhelpful thinking patterns (upsetting ideas, self-talk and mental images) Process distress and allow for emotional release  Questioning and challenging thoughts Cognitive reappraisal  Restructuring, Socratic questioning  Provided psychoeducation on core beliefs, explore, and assist patient in identifying core beliefs and modify underlying beliefs Treatment Target: Creating a life worth living  Values clarification   Self-care - nutrition, sleep, exercise  Mindfulness skills Interpersonal Effectiveness  Emotion Regulation Distress Tolerance Treatment Target: Increase coping skills to manage panic and panic attacks  Intentional breathing exercises 5 senses Grounding exercises - holding ice cube, ice on forehead, washing hands in cold water Treatment Target: Increase psychological flexibility (Acceptance, defusion, observer self/perspective-taking, present moment awareness,  values and committed action)  Teach mindfulness skills  Psychoeducation about cognitive fusion- Hand as thoughts metaphor  Increase awareness of control vs. acceptance Reduce avoidance of certain emotions or situations Discuss "unhooking" and emphasize that thoughts and feelings do not always lead to action Increasing self-knowledge  Getting in touch with the observant self  Values-guided behavioral change  Future Appointments  Date Time Provider Department Center  03/24/2023  3:00 PM Kathreen Cosier, LCSW [facility] None    Kathreen Cosier, LCSW

## 2023-03-12 ENCOUNTER — Emergency Department
Admission: EM | Admit: 2023-03-12 | Discharge: 2023-03-12 | Disposition: A | Discharge status: Home / Self Care | Payer: BC Managed Care – PPO | Attending: Emergency Medicine | Admitting: Emergency Medicine

## 2023-03-12 ENCOUNTER — Encounter: Payer: Self-pay | Admitting: Emergency Medicine

## 2023-03-12 ENCOUNTER — Other Ambulatory Visit: Payer: Self-pay

## 2023-03-12 DIAGNOSIS — Z1152 Encounter for screening for COVID-19: Secondary | ICD-10-CM | POA: Diagnosis not present

## 2023-03-12 DIAGNOSIS — R5383 Other fatigue: Secondary | ICD-10-CM | POA: Diagnosis present

## 2023-03-12 LAB — COMPREHENSIVE METABOLIC PANEL
ALT: 17 U/L (ref 0–44)
AST: 21 U/L (ref 15–41)
Albumin: 4.1 g/dL (ref 3.5–5.0)
Alkaline Phosphatase: 55 U/L (ref 38–126)
Anion gap: 9 (ref 5–15)
BUN: 10 mg/dL (ref 6–20)
CO2: 25 mmol/L (ref 22–32)
Calcium: 8.8 mg/dL — ABNORMAL LOW (ref 8.9–10.3)
Chloride: 103 mmol/L (ref 98–111)
Creatinine, Ser: 1.28 mg/dL — ABNORMAL HIGH (ref 0.61–1.24)
GFR, Estimated: >60 mL/min (ref >60)
Glucose, Bld: 113 mg/dL — ABNORMAL HIGH (ref 70–99)
Potassium: 3.8 mmol/L (ref 3.5–5.1)
Sodium: 137 mmol/L (ref 135–145)
Total Bilirubin: 0.5 mg/dL (ref 0.3–1.2)
Total Protein: 7 g/dL (ref 6.5–8.1)

## 2023-03-12 LAB — CBC WITH DIFFERENTIAL/PLATELET
Abs Immature Granulocytes: 0.02 10*3/uL (ref 0.00–0.07)
Basophils Absolute: 0.1 10*3/uL (ref 0.0–0.1)
Basophils Relative: 1 %
Eosinophils Absolute: 0.3 10*3/uL (ref 0.0–0.5)
Eosinophils Relative: 5 %
HCT: 41.3 % (ref 39.0–52.0)
Hemoglobin: 13.8 g/dL (ref 13.0–17.0)
Immature Granulocytes: 0 %
Lymphocytes Relative: 39 %
Lymphs Abs: 2.9 10*3/uL (ref 0.7–4.0)
MCH: 32.4 pg (ref 26.0–34.0)
MCHC: 33.4 g/dL (ref 30.0–36.0)
MCV: 96.9 fL (ref 80.0–100.0)
Monocytes Absolute: 0.6 10*3/uL (ref 0.1–1.0)
Monocytes Relative: 8 %
Neutro Abs: 3.5 10*3/uL (ref 1.7–7.7)
Neutrophils Relative %: 47 %
Platelets: 172 10*3/uL (ref 150–400)
RBC: 4.26 MIL/uL (ref 4.22–5.81)
RDW: 12 % (ref 11.5–15.5)
WBC: 7.4 10*3/uL (ref 4.0–10.5)
nRBC: 0 % (ref 0.0–0.2)

## 2023-03-12 LAB — SARS CORONAVIRUS 2 BY RT PCR: SARS Coronavirus 2 by RT PCR: NEGATIVE

## 2023-03-12 MED ORDER — SODIUM CHLORIDE 0.9 % IV BOLUS
1000.0000 mL | Freq: Once | INTRAVENOUS | Status: AC
  Administered 2023-03-12: 1000 mL via INTRAVENOUS

## 2023-03-12 NOTE — Discharge Instructions (Signed)
 Your testing today was overall reassuring.  Follow with your primary care doctor for further evaluation.  Return to the ER for new or worsening symptoms.

## 2023-03-12 NOTE — ED Provider Notes (Signed)
[facility] Provider Note    Event Date/Time   First MD Initiated Contact with Patient 03/12/23 0700     (approximate)   History   Fatigue   HPI  Jose George is a 52 year old male presenting to the emergency department for evaluation of fatigue.  Patient works the night shift.  Yesterday, he felt tired, but had difficulty sleeping and now feels generally fatigued.  Reports some migratory tinglings sensation. No numbness, not localized to one area.  Reports some ongoing sinus issues.  Took an over-the-counter sinus spray, denies using other medications.  No headache, dizziness, vision changes, fevers, cough, chest pain, shortness of breath, abdominal pain, nausea, vomiting, diarrhea.  Was recently on doxycycline after tick exposure.      Physical Exam   Triage Vital Signs: ED Triage Vitals  Encounter Vitals Group     BP 03/12/23 0622 (!) 137/97     Systolic BP Percentile --      Diastolic BP Percentile --      Pulse Rate 03/12/23 0622 87     Resp 03/12/23 0622 18     Temp 03/12/23 0622 98.5 F (36.9 C)     Temp Source 03/12/23 0622 Oral     SpO2 03/12/23 0622 97 %     Weight 03/12/23 0618 201 lb 1 oz (91.2 kg)     Height 03/12/23 0618 5\' 11"  (1.803 m)     Head Circumference --      Peak Flow --      Pain Score 03/12/23 0618 0     Pain Loc --      Pain Education --      Exclude from Growth Chart --     Most recent vital signs: Vitals:   03/12/23 0622 03/12/23 0710  BP: (!) 137/97 (!) 144/97  Pulse: 87 71  Resp: 18 16  Temp: 98.5 F (36.9 C)   SpO2: 97% 95%     General: Awake, interactive  CV:  Regular rate, good peripheral perfusion.  Resp:  Lungs clear, unlabored respirations.  Abd:  Soft, nondistended, nontender to palpation Neuro:  Alert and oriented, normal extraocular movements, symmetric facial movement, sensation intact over bilateral upper and lower extremities with 5 out of 5 strength.  Normal finger-to-nose  testing.   ED Results / Procedures / Treatments   Labs (all labs ordered are listed, but only abnormal results are displayed) Labs Reviewed  COMPREHENSIVE METABOLIC PANEL - Abnormal; Notable for the following components:      Result Value   Glucose, Bld 113 (*)    Creatinine, Ser 1.28 (*)    Calcium 8.8 (*)    All other components within normal limits  SARS CORONAVIRUS 2 BY RT PCR  CBC WITH DIFFERENTIAL/PLATELET     EKG EKG independently reviewed interpreted by myself (ER attending) demonstrates:    RADIOLOGY Imaging independently reviewed and interpreted by myself demonstrates:    PROCEDURES:  Critical Care performed: No  Procedures   MEDICATIONS ORDERED IN ED: Medications  sodium chloride 0.9 % bolus 1,000 mL (1,000 mLs Intravenous New Bag/Given 03/12/23 0723)     IMPRESSION / MDM / ASSESSMENT AND PLAN / ED COURSE  I reviewed the triage vital signs and the nursing notes.  Differential diagnosis includes, but is not limited to, anemia, electrolyte abnormality, disordered sleep in the setting of night shift work, viral illness  Patient's presentation is most consistent with acute illness / injury with system symptoms.  52 year old male presenting to  the emergency department for evaluation of fatigue.  Vital signs and neurologic exam reassuring.  Basic labs without severe derangement.  Patient treated with IV fluids.  On reevaluation, he does report feeling improved.  He additionally reports that he has actually had multiple similar episodes in the past, unsure of the cause.  He does have a primary care doctor who can follow-up with.  Do think he is stable for discharge with outpatient follow-up.  Strict return precautions provided.  Patient discharged in stable condition.     FINAL CLINICAL IMPRESSION(S) / ED DIAGNOSES   Final diagnoses:  Fatigue, unspecified type     Rx / DC Orders   ED Discharge Orders     None        Note:  This document was  prepared using Dragon voice recognition software and may include unintentional dictation errors.   Trinna Post, MD 03/12/23 [phone]

## 2023-03-12 NOTE — ED Triage Notes (Signed)
Pt presents ambulatory to triage via POV with complaints of fatigue since yesterday but hasn't had any sleep last night. He states that his whole body feels numb due to lack of sleep. Currently using sinus spray to treat a his sinuses. A&Ox4 at this time. Denies dizziness, fevers, cough, chills, numbness, abdominal pain, CP or SOB.

## 2023-03-16 NOTE — Group Note (Deleted)

## 2023-03-24 ENCOUNTER — Ambulatory Visit: Payer: Self-pay | Admitting: Licensed Clinical Social Worker

## 2023-03-24 DIAGNOSIS — F411 Generalized anxiety disorder: Secondary | ICD-10-CM

## 2023-03-24 NOTE — Progress Notes (Signed)
Counselor/Therapist Progress Note  Patient ID: Jose George, MRN: [id number],    Date: 03/24/2023  Time Spent: 45 minutes    Treatment Type: Psychotherapy  Reported Symptoms:  Anxiety, anxious thoughts; low mood, sadness    Mental Status Exam:  Appearance:   Casual and Neat     Behavior:  Appropriate, Sharing, and Motivated  Motor:  Normal  Speech/Language:   Clear and Coherent and Normal Rate  Affect:  Appropriate, Congruent, and Full Range  Mood:  normal  Thought process:  normal  Thought content:    WNL  Sensory/Perceptual disturbances:    WNL  Orientation:  oriented to person, place, time/date, situation, and day of week  Attention:  Good  Concentration:  Good  Memory:  WNL  Fund of knowledge:   Good  Insight:    Fair  Judgment:   Fair  Impulse Control:  Fair   Risk Assessment: Danger to Self:  No Self-injurious Behavior: No Danger to Others: No Duty to Warn:no Physical Aggression / Violence:No  Access to Firearms a concern: No  Gang Involvement:No   Subjective: Patient was engaged and cooperative throughout the session using time effectively to discuss thoughts and feelings. Patient reports ups and downs related to romantic relationship challenges leading to anxiety, anxious thoughts, and low mood. Patient voices continued motivation for treatment and reports benefit from regular sessions in addressing these symptoms.    Interventions: Cognitive Behavioral Therapy and Client Centered  Established psychological safety. Checked in with patient regarding current symptoms and psychosocial stressors. LCSW assisted patient in identifying and processing their thoughts and emotions regarding challenges in romantic relationships. LCSW validated patient's feelings of disappointment. LCSW explored ways patient can increase social interactions. Provided support through active listening, validation of feelings, and highlighted patient's strengths.  Diagnosis:   ICD-10-CM    1. Generalized anxiety disorder  F41.1      Plan: Patient's goal of treatment is dealing with emotions, feelings, past problems, and going forward and processing these experiences to deal with them in a rational way.    Treatment Target: Understand the relationship between thoughts, emotions, and behaviors  Psychoeducation on CBTs model   Oriented the client to the therapeutic approach Teach the connection between thoughts, emotions, and behaviors  Enhance emotional awareness and discrimination of emotions  Understand thoughts, emotions, and behaviors  Treatment Target: Increase realistic balanced thinking -to learn how to replace thinking with thoughts that are more accurate or helpful Explore patient's thoughts, beliefs, automatic thoughts, assumptions  Identify and replace unhelpful thinking patterns (upsetting ideas, self-talk and mental images) Process distress and allow for emotional release  Questioning and challenging thoughts Cognitive reappraisal  Restructuring, Socratic questioning  Provided psychoeducation on core beliefs, explore, and assist patient in identifying core beliefs and modify underlying beliefs Treatment Target: Creating a life worth living  Values clarification   Self-care - nutrition, sleep, exercise  Mindfulness skills Interpersonal Effectiveness  Emotion Regulation Distress Tolerance Treatment Target: Increase coping skills to manage panic and panic attacks  Intentional breathing exercises 5 senses Grounding exercises - holding ice cube, ice on forehead, washing hands in cold water Treatment Target: Increase psychological flexibility (Acceptance, defusion, observer self/perspective-taking, present moment awareness, values and committed action) Teach mindfulness skills  Psychoeducation about cognitive fusion- Hand as thoughts metaphor  Increase awareness of control vs. acceptance Reduce avoidance of certain emotions or situations Discuss "unhooking"  and emphasize that thoughts and feelings do not always lead to action Increasing self-knowledge  Getting in touch with the observant  self  Values-guided behavioral change  Future Appointments  Date Time Provider Department Center  04/07/2023  3:00 PM Kathreen Cosier, LCSW [facility] None     Kathreen Cosier, [place]

## 2023-04-07 ENCOUNTER — Ambulatory Visit: Payer: Self-pay | Admitting: Licensed Clinical Social Worker

## 2023-04-07 DIAGNOSIS — F411 Generalized anxiety disorder: Secondary | ICD-10-CM

## 2023-04-07 NOTE — Progress Notes (Signed)
Counselor/Therapist Progress Note  Patient ID: Jose George, MRN: [id number],    Date: 04/07/2023  Time Spent: 46 minutes    Treatment Type: Psychotherapy  Reported Symptoms:  mild anxiety, low mood  Mental Status Exam:  Appearance:   Casual     Behavior:  Appropriate, Sharing, Resistant, and Motivated  Motor:  Normal  Speech/Language:   Clear and Coherent and Normal Rate  Affect:  Appropriate, Congruent, and Full Range  Mood:  dysthymic  Thought process:  normal  Thought content:    WNL  Sensory/Perceptual disturbances:    WNL  Orientation:  oriented to person, place, time/date, situation, and day of week  Attention:  Good  Concentration:  Good  Memory:  WNL  Fund of knowledge:   Good  Insight:    Fair  Judgment:   Fair  Impulse Control:  Fair   Risk Assessment: Danger to Self:  No Self-injurious Behavior: No Danger to Others: No Duty to Warn:no Physical Aggression / Violence:No  Access to Firearms a concern: No  Gang Involvement:No   Subjective: Patient was engaged and cooperative throughout the session using time effectively to discuss thoughts and feelings. Patient reports his mood has been low and he is discouraged about dating. Patient was receptive to some of the feedback and intervention from LCSW and voiced resistance to increasing engagement in activities at this time. Patient voices continued motivation for treatment.  Interventions: Cognitive Behavioral Therapy, Motivational Interviewing, and Client Centered  Established psychological safety. LCSW met with patient to identify needs related to stressors and functioning, and assess and monitor for signs and symptoms of anxiety and depression, and assess safety. Checked in with patient regarding how they have been doing since the last follow-up session. LCSW assisted patient in processing their thoughts and emotions regarding challenges in romantic and platonic relationships, providing supportive space and  validating patient's experience. Explored patient's values and options to increase engagement in alternative meaningful activities. Provided support through active listening, validation of feelings, and highlighted patient's strengths.  Diagnosis:   ICD-10-CM   1. Generalized anxiety disorder  F41.1      Plan: Patient's goal of treatment is dealing with emotions, feelings, past problems, and going forward and processing these experiences to deal with them in a rational way.    Treatment Target: Understand the relationship between thoughts, emotions, and behaviors  Psychoeducation on CBTs model   Oriented the client to the therapeutic approach Teach the connection between thoughts, emotions, and behaviors  Enhance emotional awareness and discrimination of emotions  Understand thoughts, emotions, and behaviors  Treatment Target: Increase realistic balanced thinking -to learn how to replace thinking with thoughts that are more accurate or helpful Explore patient's thoughts, beliefs, automatic thoughts, assumptions  Identify and replace unhelpful thinking patterns (upsetting ideas, self-talk and mental images) Process distress and allow for emotional release  Questioning and challenging thoughts Cognitive reappraisal  Restructuring, Socratic questioning  Provided psychoeducation on core beliefs, explore, and assist patient in identifying core beliefs and modify underlying beliefs Treatment Target: Creating a life worth living  Values clarification   Self-care - nutrition, sleep, exercise  Mindfulness skills Interpersonal Effectiveness  Emotion Regulation Distress Tolerance Treatment Target: Increase coping skills to manage panic and panic attacks  Intentional breathing exercises 5 senses Grounding exercises - holding ice cube, ice on forehead, washing hands in cold water Treatment Target: Increase psychological flexibility (Acceptance, defusion, observer self/perspective-taking, present  moment awareness, values and committed action) Teach mindfulness skills  Psychoeducation about cognitive fusion- Hand  as thoughts metaphor  Increase awareness of control vs. acceptance Reduce avoidance of certain emotions or situations Discuss "unhooking" and emphasize that thoughts and feelings do not always lead to action Increasing self-knowledge  Getting in touch with the observant self  Values-guided behavioral change  Future Appointments  Date Time Provider Department Center  04/21/2023  3:00 PM Kathreen Cosier, LCSW [facility] None     Kathreen Cosier, LCSW

## 2023-04-21 ENCOUNTER — Ambulatory Visit: Payer: Self-pay | Admitting: Licensed Clinical Social Worker

## 2023-04-21 DIAGNOSIS — F411 Generalized anxiety disorder: Secondary | ICD-10-CM

## 2023-04-21 NOTE — Progress Notes (Signed)
Counselor/Therapist Progress Note  Patient ID: Jose George, MRN: [id number],    Date: 04/21/2023  Time Spent: 50 minutes    Treatment Type: Psychotherapy  Reported Symptoms:  Stable mood; mild anxiety, sleep disturbance- medication does help   Mental Status Exam:  Appearance:   Casual, Neat, and Well Groomed     Behavior:  Appropriate, Sharing, and Motivated  Motor:  Normal  Speech/Language:   Clear and Coherent and Normal Rate  Affect:  Appropriate, Congruent, and Full Range  Mood:  normal  Thought process:  normal  Thought content:    WNL  Sensory/Perceptual disturbances:    WNL  Orientation:  oriented to person, place, time/date, situation, and day of week  Attention:  Good  Concentration:  Good  Memory:  WNL  Fund of knowledge:   Good  Insight:    Fair  Judgment:   Fair  Impulse Control:  Fair   Risk Assessment: Danger to Self:  No Self-injurious Behavior: No  Danger to Others: No Duty to Warn:no Physical Aggression / Violence:No  Access to Firearms a concern: No  Gang Involvement:No   Subjective: Patient was engaged and cooperative throughout the session using time to discuss thoughts and feelings. He reports that his mood has been stable and his anxiety has been "fairly calm." Currently, he feels that there isn't much happening in his life. While he has been trying breathing techniques to aid his sleep, he still struggles to quiet his mind; however, he acknowledges the benefits of his medication. The patient expressed ongoing motivation for treatment and recognizes the value he gains from our sessions.  Interventions: Cognitive Behavioral Therapy and Client Centered  Established psychological safety. Checked in with patient regarding their week. Clinician met with patient to identify needs related to stressors and functioning, and assess and monitor for signs and symptoms of anxiety and depression, and assess safety.  LCSW assisted patient in processing their  thoughts and emotions about what they have experienced with work and with relationships with friends. LCSW reviewed acceptance and discussed patient's values. Provided support through active listening, validation of feelings, and highlighted patient's strengths.   Diagnosis:   ICD-10-CM   1. Generalized anxiety disorder  F41.1      Plan: Patient's goal of treatment is dealing with emotions, feelings, past problems, and going forward and processing these experiences to deal with them in a rational way.    Treatment Target: Understand the relationship between thoughts, emotions, and behaviors  Psychoeducation on CBTs model   Oriented the client to the therapeutic approach Teach the connection between thoughts, emotions, and behaviors  Enhance emotional awareness and discrimination of emotions  Understand thoughts, emotions, and behaviors  Treatment Target: Increase realistic balanced thinking -to learn how to replace thinking with thoughts that are more accurate or helpful Explore patient's thoughts, beliefs, automatic thoughts, assumptions  Identify and replace unhelpful thinking patterns (upsetting ideas, self-talk and mental images) Process distress and allow for emotional release  Questioning and challenging thoughts Cognitive reappraisal  Restructuring, Socratic questioning  Provided psychoeducation on core beliefs, explore, and assist patient in identifying core beliefs and modify underlying beliefs Treatment Target: Creating a life worth living  Values clarification   Self-care - nutrition, sleep, exercise  Mindfulness skills Interpersonal Effectiveness  Emotion Regulation Distress Tolerance Treatment Target: Increase coping skills to manage panic and panic attacks  Intentional breathing exercises 5 senses Grounding exercises - holding ice cube, ice on forehead, washing hands in cold water Treatment Target: Increase psychological flexibility (Acceptance,  defusion, observer  self/perspective-taking, present moment awareness, values and committed action) Teach mindfulness skills  Psychoeducation about cognitive fusion- Hand as thoughts metaphor  Increase awareness of control vs. acceptance Reduce avoidance of certain emotions or situations Discuss "unhooking" and emphasize that thoughts and feelings do not always lead to action Increasing self-knowledge  Getting in touch with the observant self  Values-guided behavioral change  Future Appointments  Date Time Provider Department Center  05/05/2023  3:00 PM Kathreen Cosier, LCSW [facility] None    Kathreen Cosier, LCSW

## 2023-04-29 ENCOUNTER — Ambulatory Visit: Admit: 2023-04-29 | Payer: PRIVATE HEALTH INSURANCE

## 2023-05-05 ENCOUNTER — Ambulatory Visit: Payer: Self-pay | Admitting: Licensed Clinical Social Worker

## 2023-05-05 DIAGNOSIS — F411 Generalized anxiety disorder: Secondary | ICD-10-CM

## 2023-05-05 NOTE — Progress Notes (Signed)
Counselor/Therapist Progress Note  Patient ID: Jose George, MRN: [id number],    Date: 05/05/2023  Time Spent: 53 minutes    Treatment Type: Psychotherapy  Reported Symptoms:  "okay", low mood, anxiety, anxious thoughts. Panic symptoms    Mental Status Exam:  Appearance:   Casual and Neat     Behavior:  Appropriate, Sharing, and Motivated  Motor:  Normal  Speech/Language:   Clear and Coherent and Normal Rate  Affect:  Appropriate, Congruent, and Full Range  Mood:  normal  Thought process:  normal  Thought content:    WNL  Sensory/Perceptual disturbances:    WNL  Orientation:  oriented to person, place, time/date, situation, and day of week  Attention:  Good  Concentration:  Good  Memory:  WNL  Fund of knowledge:   Good  Insight:    Fair  Judgment:   Fair  Impulse Control:  Fair   Risk Assessment: Danger to Self:  No Self-injurious Behavior: No Danger to Others: No Duty to Warn:no Physical Aggression / Violence:No  Access to Firearms a concern: No  Gang Involvement:No   Subjective: Patient participated throughout the session, was responsive and receptive to feedback from LCSW. He reports low mood due to challenges at work and low social engagement. Patient voices continued motivation for treatment.   Interventions: Cognitive Behavioral Therapy and Client Centered  Established psychological safety. Clinician met with patient to identify needs related to stressors and functioning, and assess and monitor for signs and symptoms of depression and anxiety, and assess safety. LCSW assisted patient in processing their emotions about challenges at work since the last session; and what they have experienced in regards to lack of engagement with friends and loneliness. LCSW reviewed panic symptoms and encouraged patient to follow up on panic symptoms with medical provider. Reviewed treatment plan with patient and discussed continued struggles with unresolved issues from his past.  Provided support through active listening, validation of feelings, and highlighted patient's strengths.    Diagnosis:   ICD-10-CM   1. Generalized anxiety disorder  F41.1       Plan: NEXT SESSION: focus on unresolved issues from patient's past.   Patient's goal of treatment is dealing with emotions, feelings, past problems, and going forward and processing these experiences to deal with them in a rational way.    Treatment Target: Understand the relationship between thoughts, emotions, and behaviors  Psychoeducation on CBTs model   Oriented the client to the therapeutic approach Teach the connection between thoughts, emotions, and behaviors  Enhance emotional awareness and discrimination of emotions  Understand thoughts, emotions, and behaviors  Treatment Target: Increase realistic balanced thinking -to learn how to replace thinking with thoughts that are more accurate or helpful Explore patient's thoughts, beliefs, automatic thoughts, assumptions  Identify and replace unhelpful thinking patterns (upsetting ideas, self-talk and mental images) Process distress and allow for emotional release  Questioning and challenging thoughts Cognitive reappraisal  Restructuring, Socratic questioning  Provided psychoeducation on core beliefs, explore, and assist patient in identifying core beliefs and modify underlying beliefs Treatment Target: Creating a life worth living  Values clarification   Self-care - nutrition, sleep, exercise  Mindfulness skills Interpersonal Effectiveness  Emotion Regulation Distress Tolerance Treatment Target: Increase coping skills to manage panic and panic attacks  Intentional breathing exercises 5 senses Grounding exercises - holding ice cube, ice on forehead, washing hands in cold water Treatment Target: Increase psychological flexibility (Acceptance, defusion, observer self/perspective-taking, present moment awareness, values and committed action) Teach  mindfulness skills  Psychoeducation about cognitive fusion- Hand as thoughts metaphor  Increase awareness of control vs. acceptance Reduce avoidance of certain emotions or situations Discuss "unhooking" and emphasize that thoughts and feelings do not always lead to action Increasing self-knowledge  Getting in touch with the observant self  Values-guided behavioral change  Future Appointments  Date Time Provider Department Center  05/12/2023  3:00 PM Kathreen Cosier, LCSW [facility] None     Kathreen Cosier, LCSW

## 2023-05-12 ENCOUNTER — Ambulatory Visit: Payer: Self-pay | Admitting: Licensed Clinical Social Worker

## 2023-05-12 DIAGNOSIS — F411 Generalized anxiety disorder: Secondary | ICD-10-CM

## 2023-05-12 NOTE — Progress Notes (Signed)
Counselor/Therapist Progress Note  Patient ID: Jose George, MRN: [id number],    Date: 05/12/2023  Time Spent: 48 minutes    Treatment Type: Psychotherapy  Reported Symptoms:  Overall stable mood, moments of anxiety, anxious thoughts  Mental Status Exam:  Appearance:   Casual and Neat     Behavior:  Appropriate, Sharing, and Motivated  Motor:  Normal  Speech/Language:   Clear and Coherent and Normal Rate  Affect:  Appropriate, Congruent, and Full Range  Mood:  normal  Thought process:  normal  Thought content:    WNL  Sensory/Perceptual disturbances:    WNL  Orientation:  oriented to person, place, time/date, situation, and day of week  Attention:  Good  Concentration:  Good  Memory:  WNL  Fund of knowledge:   Good  Insight:    Fair  Judgment:   Fair  Impulse Control:  Fair   Risk Assessment: Danger to Self:  No Self-injurious Behavior: No Danger to Others: No Duty to Warn:no Physical Aggression / Violence:No  Access to Firearms a concern: No  Gang Involvement:No   Subjective: Patient was engaged and cooperative throughout the session using time effectively to discuss thoughts and feelings. Patient reports overall his mood has been okay and reports that he has had intermittent anxiety symptoms. Patient voices continued motivation for treatment and benefit from regular sessions.      Interventions: Cognitive Behavioral Therapy and Client Centered  Established psychological safety. Checked in with patient regarding their week and processed with the patient how they have been doing since the last follow-up session. Conducted brief assessment and set agenda with patient. LCSW assisted patient in processing their thoughts and emotions regarding unresolved past issues, providing supportive space, offering reflective listening and compassionate presence. LCSW taught patient about ruminating thoughts and encouraged patient to notice these thoughts. Provided support through  active listening, validation of feelings, and highlighted patient's strengths.  Diagnosis:   ICD-10-CM   1. Generalized anxiety disorder  F41.1      Plan:  Patient's goal of treatment is dealing with emotions, feelings, past problems, and going forward and processing these experiences to deal with them in a rational way.    Treatment Target: Understand the relationship between thoughts, emotions, and behaviors  Psychoeducation on CBTs model   Oriented the client to the therapeutic approach Teach the connection between thoughts, emotions, and behaviors  Enhance emotional awareness and discrimination of emotions  Understand thoughts, emotions, and behaviors  Treatment Target: Increase realistic balanced thinking -to learn how to replace thinking with thoughts that are more accurate or helpful Explore patient's thoughts, beliefs, automatic thoughts, assumptions  Identify and replace unhelpful thinking patterns (upsetting ideas, self-talk and mental images) Process distress and allow for emotional release  Questioning and challenging thoughts Cognitive reappraisal  Restructuring, Socratic questioning  Provided psychoeducation on core beliefs, explore, and assist patient in identifying core beliefs and modify underlying beliefs Treatment Target: Creating a life worth living  Values clarification   Self-care - nutrition, sleep, exercise  Mindfulness skills Interpersonal Effectiveness  Emotion Regulation Distress Tolerance Treatment Target: Increase coping skills to manage panic and panic attacks  Intentional breathing exercises 5 senses Grounding exercises - holding ice cube, ice on forehead, washing hands in cold water Treatment Target: Increase psychological flexibility (Acceptance, defusion, observer self/perspective-taking, present moment awareness, values and committed action) Teach mindfulness skills  Psychoeducation about cognitive fusion- Hand as thoughts metaphor  Increase  awareness of control vs. acceptance Reduce avoidance of certain emotions or situations Discuss "  unhooking" and emphasize that thoughts and feelings do not always lead to action Increasing self-knowledge  Getting in touch with the observant self  Values-guided behavioral change  Future Appointments  Date Time Provider Department Center  06/02/2023  2:30 PM Kathreen Cosier, LCSW [facility] None    Kathreen Cosier, LCSW

## 2023-06-02 ENCOUNTER — Ambulatory Visit: Payer: Self-pay | Admitting: Licensed Clinical Social Worker

## 2023-06-02 DIAGNOSIS — F411 Generalized anxiety disorder: Secondary | ICD-10-CM

## 2023-06-02 NOTE — Progress Notes (Signed)
Counselor/Therapist Progress Note  Patient ID: Jose George, MRN: [id number],    Date: 06/02/2023  Time Spent: 45 minutes    Treatment Type: Psychotherapy  Reported Symptoms:  Current mood state stable; Increased anxiety, anxious thoughts over the past two weeks   Mental Status Exam:  Appearance:   Casual and Neat     Behavior:  Appropriate, Sharing, and Motivated  Motor:  Normal  Speech/Language:   Clear and Coherent and Normal Rate  Affect:  Appropriate, Congruent, and Full Range  Mood:  normal  Thought process:  normal  Thought content:    WNL  Sensory/Perceptual disturbances:    WNL  Orientation:  oriented to person, place, time/date, situation, and day of week  Attention:  Good  Concentration:  Good  Memory:  WNL  Fund of knowledge:   Good  Insight:    Fair  Judgment:   Fair  Impulse Control:  Fair   Risk Assessment: Danger to Self:  No Self-injurious Behavior: No Danger to Others: No Duty to Warn:no Physical Aggression / Violence:No  Access to Firearms a concern: No  Gang Involvement:No   Subjective: Patient reports that he has been very anxious over the past few weeks due to conflict at [employer] challenges. Patient reports current anxiety level is stable. Patient was engaged and cooperative throughout the session using time to discuss thoughts and feelings. Patient voices continued motivation for treatment and benefit from regular sessions.    Interventions: Cognitive Behavioral Therapy and Client Centered  LCSW established psychological safety. LCSW met with patient to identify needs related to stressors and functioning, and assess and monitor for signs and symptoms of anxiety and depression, and assess safety. Checked in with patient regarding how they have been doing since the last follow-up session. LCSW assisted patient in processing their thoughts and emotions regarding multiple stressors, including work conflict and [employer].  LCSW reviewed  coping strategies, including limiting consumption of information, refocusing thoughts, and taught patient about radical acceptance. Provided support through active listening, validation of feelings, and highlighted patient's strengths.   Diagnosis:   ICD-10-CM   1. Generalized anxiety disorder  F41.1       Plan:  NEXT SESSION: focus on unresolved issues from patient's past.    Patient's goal of treatment is dealing with emotions, feelings, past problems, and going forward and processing these experiences to deal with them in a rational way.    Treatment Target: Understand the relationship between thoughts, emotions, and behaviors  Psychoeducation on CBTs model   Oriented the client to the therapeutic approach Teach the connection between thoughts, emotions, and behaviors  Enhance emotional awareness and discrimination of emotions  Understand thoughts, emotions, and behaviors  Treatment Target: Increase realistic balanced thinking -to learn how to replace thinking with thoughts that are more accurate or helpful Explore patient's thoughts, beliefs, automatic thoughts, assumptions  Identify and replace unhelpful thinking patterns (upsetting ideas, self-talk and mental images) Process distress and allow for emotional release  Questioning and challenging thoughts Cognitive reappraisal  Restructuring, Socratic questioning  Provided psychoeducation on core beliefs, explore, and assist patient in identifying core beliefs and modify underlying beliefs Treatment Target: Creating a life worth living  Values clarification   Self-care - nutrition, sleep, exercise  Mindfulness skills Interpersonal Effectiveness  Emotion Regulation Distress Tolerance Treatment Target: Increase coping skills to manage panic and panic attacks  Intentional breathing exercises 5 senses Grounding exercises - holding ice cube, ice on forehead, washing hands in cold water Treatment Target: Increase psychological  flexibility (Acceptance, defusion, observer self/perspective-taking, present moment awareness, values and committed action) Teach mindfulness skills  Psychoeducation about cognitive fusion- Hand as thoughts metaphor  Increase awareness of control vs. acceptance Reduce avoidance of certain emotions or situations Discuss "unhooking" and emphasize that thoughts and feelings do not always lead to action Increasing self-knowledge  Getting in touch with the observant self  Values-guided behavioral change  Future Appointments  Date Time Provider Department Center  06/16/2023  3:00 PM Kathreen Cosier, LCSW [facility] None    Kathreen Cosier, LCSW

## 2023-06-16 ENCOUNTER — Ambulatory Visit: Payer: Self-pay | Admitting: Licensed Clinical Social Worker

## 2023-06-16 DIAGNOSIS — F411 Generalized anxiety disorder: Secondary | ICD-10-CM

## 2023-06-16 NOTE — Progress Notes (Signed)
Counselor/Therapist Progress Note  Patient ID: Jose George, MRN: [id number],    Date: 06/16/2023  Time Spent: 50 minutes    Treatment Type: Psychotherapy  Reported Symptoms:  Overall mood stability with mild anxiety, anxious thoughts; ruminating thoughts leading to intermittent low mood  Mental Status Exam:  Appearance:   Casual and Neat     Behavior:  Appropriate, Sharing, and Motivated  Motor:  Normal  Speech/Language:   Clear and Coherent and Normal Rate  Affect:  Appropriate, Congruent, and Full Range  Mood:  normal  Thought process:  normal  Thought content:    WNL  Sensory/Perceptual disturbances:    WNL  Orientation:  oriented to person, place, time/date, situation, and day of week  Attention:  Good  Concentration:  Good  Memory:  WNL  Fund of knowledge:   Good  Insight:    Fair  Judgment:   Fair  Impulse Control:  Fair   Risk Assessment: Danger to Self:  No Self-injurious Behavior: No Danger to Others: No Duty to Warn:no Physical Aggression / Violence:No  Access to Firearms a concern: No  Gang Involvement:No   Subjective: Patient reports that things have been busy lately due to work. He reports overall stable mood. Patient was engaged and cooperative throughout the session using time to discuss thoughts and feelings related to experiences since last session. Patient voices continued motivation for treatment.  Interventions: Cognitive Behavioral Therapy and Client Centered  LCSW established psychological safety. LCSW met with patient to identify needs related to stressors and functioning, and assess and monitor anxiety and panic symptoms, and assess safety. Checked in with patient regarding how they have been doing since the last follow-up session. The session was devoted to work on communication skills and problem solving related to challenges in navigating friendships. Provided support through active listening, validation of feelings, and highlighted  patient's strengths.  Diagnosis:   ICD-10-CM   1. Generalized anxiety disorder  F41.1      Plan:  Patient's goal of treatment is dealing with emotions, feelings, past problems, and going forward and processing these experiences to deal with them in a rational way.    Treatment Target: Understand the relationship between thoughts, emotions, and behaviors  Psychoeducation on CBTs model   Oriented the client to the therapeutic approach Teach the connection between thoughts, emotions, and behaviors  Enhance emotional awareness and discrimination of emotions  Understand thoughts, emotions, and behaviors  Treatment Target: Increase realistic balanced thinking -to learn how to replace thinking with thoughts that are more accurate or helpful Explore patient's thoughts, beliefs, automatic thoughts, assumptions  Identify and replace unhelpful thinking patterns (upsetting ideas, self-talk and mental images) Process distress and allow for emotional release  Questioning and challenging thoughts Cognitive reappraisal  Restructuring, Socratic questioning  Provided psychoeducation on core beliefs, explore, and assist patient in identifying core beliefs and modify underlying beliefs Treatment Target: Creating a life worth living  Values clarification   Self-care - nutrition, sleep, exercise  Mindfulness skills Interpersonal Effectiveness  Emotion Regulation Distress Tolerance Treatment Target: Increase coping skills to manage panic and panic attacks  Intentional breathing exercises 5 senses Grounding exercises - holding ice cube, ice on forehead, washing hands in cold water Treatment Target: Increase psychological flexibility (Acceptance, defusion, observer self/perspective-taking, present moment awareness, values and committed action) Teach mindfulness skills  Psychoeducation about cognitive fusion- Hand as thoughts metaphor  Increase awareness of control vs. acceptance Reduce avoidance of  certain emotions or situations Discuss "unhooking" and emphasize that thoughts and  feelings do not always lead to action Increasing self-knowledge  Getting in touch with the observant self  Values-guided behavioral change  Future Appointments  Date Time Provider Department Center  06/26/2023  3:30 PM [facility] PROVIDER [facility] None  07/06/2023  3:00 PM Kathreen Cosier, LCSW [facility] None    Kathreen Cosier, LCSW

## 2023-06-26 ENCOUNTER — Encounter: Payer: Self-pay | Admitting: Nurse Practitioner

## 2023-06-26 ENCOUNTER — Ambulatory Visit: Payer: Self-pay | Admitting: Nurse Practitioner

## 2023-06-26 DIAGNOSIS — Z113 Encounter for screening for infections with a predominantly sexual mode of transmission: Secondary | ICD-10-CM

## 2023-06-26 NOTE — Progress Notes (Cosign Needed)
[facility] Department STI clinic/screening visit  Subjective:  Jose George is a 52 y.o. male being seen today for an STI screening visit. The patient reports they do have symptoms.    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 Additionally, patient reports a sore throat he attributes to a cold, frequent urination, and a history of hemorrhoid. Patient indicates he has one male partner, but his partner may have other partners. He states they do not use condoms and practice giving and receiving oral and anal sex. Patient reports a history of HIV for which he is seen by a provider and on medication.    Last HIV test per patient/review of record was unknown, but patient is known HIV positive on medication. Recent testing indicates patient is undetected.   Last HEPC test per patient/review of record was No results found for: "HMHEPCSCREEN" No components found for: "HEPC"   Last HEPB test per patient/review of record was No components found for: "HMHEPBSCREEN" No components found for: "HEPC"   Does the patient or their partner desires a pregnancy in the next year? No  Screening for MPX risk: Does the patient have an unexplained rash? No Is the patient MSM? Yes Does the patient endorse multiple sex partners or anonymous sex partners? No Did the patient have close or  sexual contact with a person diagnosed with MPX? No Has the patient traveled outside the [place] where MPX is endemic? No Is there a high clinical suspicion for MPX-- evidenced by one of the following No  -Unlikely to be chickenpox  -Lymphadenopathy  -Rash that present in same phase of evolution on any given body part   See flowsheet for further details and programmatic requirements.   Immunization History  Administered Date(s) Administered   Influenza, Seasonal, Injecte, Preservative Fre 06/08/2013   Influenza,inj,Quad PF,6+ Mos 06/21/2014, 03/27/2015, 07/16/2016, 05/20/2017, 06/23/2018   PFIZER(Purple Top)SARS-COV-2 Vaccination 09/24/2019, 01/31/2020   Pneumococcal Conjugate-13 06/27/2013   Pneumococcal Polysaccharide-23 09/14/2013   Tdap 09/14/2013   Vaccinia,smallpox Monkeypox Vaccine Live,pf 04/10/2021     The following portions of the patient's history were reviewed and updated as appropriate: allergies, current medications, past medical history, past social history, past surgical history and problem list.  Objective:  There were no vitals filed for this visit.  Physical Exam Nursing note reviewed.  Constitutional:      Appearance: Normal appearance.  HENT:     Head: Normocephalic.     Salivary Glands: Right salivary gland is not diffusely enlarged or tender. Left salivary gland is not diffusely enlarged or tender.     Mouth/Throat:     Lips: Pink. No lesions.     Mouth: Mucous membranes are moist. No oral lesions.     Tongue: No lesions. Tongue does not deviate from midline.     Pharynx: Oropharynx is clear. Uvula midline. No oropharyngeal exudate or posterior oropharyngeal erythema.  Tonsils: No tonsillar exudate.  Eyes:     General:        Right eye: No discharge.        Left eye: No discharge.     Conjunctiva/sclera:     Right eye: Right conjunctiva is not injected. No exudate.    Left eye: Left conjunctiva is not injected. No exudate. Pulmonary:     Effort:  Pulmonary effort is normal.  Genitourinary:    Pubic Area: No rash or pubic lice.      Penis: Normal. No tenderness, discharge, swelling or lesions.      Testes: Normal.     Epididymis:     Right: Normal. No mass or tenderness.     Left: Normal. No mass or tenderness.     Tanner stage (genital): 5.     Comments: Patient requested to self-swab.  Lymphadenopathy:     Head:     Right side of head: No submental, submandibular, tonsillar, preauricular or posterior auricular adenopathy.     Left side of head: No submental, submandibular, tonsillar, preauricular or posterior auricular adenopathy.     Cervical: No cervical adenopathy.     Right cervical: No superficial or posterior cervical adenopathy.    Left cervical: No superficial or posterior cervical adenopathy.     Upper Body:     Right upper body: No supraclavicular or axillary adenopathy.     Left upper body: No supraclavicular or axillary adenopathy.     Lower Body: No right inguinal adenopathy. No left inguinal adenopathy.  Skin:    General: Skin is warm and dry.     Findings: No lesion or rash.  Neurological:     Mental Status: He is alert and oriented to person, place, and time.  Psychiatric:        Attention and Perception: Attention normal.        Mood and Affect: Mood and affect normal.        Speech: Speech normal.        Behavior: Behavior normal. Behavior is cooperative.        Thought Content: Thought content normal.    Assessment and Plan:  [name] is a 52 y.o. male presenting to the [facility] Department for STI screening  1. Screening for venereal disease (Primary)  - Syphilis Serology, [facility] - Chlamydia/Gonorrhea [facility] - Chlamydia/Gonorrhea [facility] - Chlamydia/GC NAA, Confirmation   Patient does have STI symptoms Patient accepted screenings including  urine GC/Chlamydia, and blood work for Syphilis. Patient meets criteria for HepB screening? Yes. Ordered?  No-Patient declined.  Patient meets criteria for HepC screening? Yes. Ordered? No-Patient declined. Recommended condom use with all sex Discussed importance of condom use for STI prevent  Treat positive test results per standing order. Discussed time line for [facility] results and that patient will be called with positive results and encouraged patient to call if he had not heard in 2 weeks Recommended repeat testing in 3 months with positive results. Recommended returning for continued or worsening symptoms.   Return if symptoms worsen or fail to improve.  Future Appointments  Date Time Provider Department Center  07/06/2023  3:00 PM Kathreen Cosier, LCSW [facility] None   Total time with patient 30 minutes.   Edmonia James, NP

## 2023-06-29 LAB — CHLAMYDIA/GC NAA, CONFIRMATION
Chlamydia trachomatis, NAA: NEGATIVE
Neisseria gonorrhoeae, NAA: NEGATIVE

## 2023-07-04 ENCOUNTER — Emergency Department: Payer: BC Managed Care – PPO

## 2023-07-04 ENCOUNTER — Other Ambulatory Visit: Payer: Self-pay

## 2023-07-04 DIAGNOSIS — J40 Bronchitis, not specified as acute or chronic: Secondary | ICD-10-CM | POA: Diagnosis not present

## 2023-07-04 DIAGNOSIS — R059 Cough, unspecified: Secondary | ICD-10-CM | POA: Diagnosis present

## 2023-07-04 DIAGNOSIS — Z1152 Encounter for screening for COVID-19: Secondary | ICD-10-CM | POA: Insufficient documentation

## 2023-07-04 DIAGNOSIS — J069 Acute upper respiratory infection, unspecified: Secondary | ICD-10-CM | POA: Diagnosis not present

## 2023-07-04 DIAGNOSIS — Z21 Asymptomatic human immunodeficiency virus [HIV] infection status: Secondary | ICD-10-CM | POA: Insufficient documentation

## 2023-07-04 LAB — COMPREHENSIVE METABOLIC PANEL
ALT: 17 U/L (ref 0–44)
AST: 17 U/L (ref 15–41)
Albumin: 4.1 g/dL (ref 3.5–5.0)
Alkaline Phosphatase: 61 U/L (ref 38–126)
Anion gap: 10 (ref 5–15)
BUN: 10 mg/dL (ref 6–20)
CO2: 24 mmol/L (ref 22–32)
Calcium: 8.8 mg/dL — ABNORMAL LOW (ref 8.9–10.3)
Chloride: 102 mmol/L (ref 98–111)
Creatinine, Ser: 1.36 mg/dL — ABNORMAL HIGH (ref 0.61–1.24)
GFR, Estimated: >60 mL/min (ref >60)
Glucose, Bld: 110 mg/dL — ABNORMAL HIGH (ref 70–99)
Potassium: 4 mmol/L (ref 3.5–5.1)
Sodium: 136 mmol/L (ref 135–145)
Total Bilirubin: 0.5 mg/dL (ref <1.2)
Total Protein: 7.5 g/dL (ref 6.5–8.1)

## 2023-07-04 LAB — RESP PANEL BY RT-PCR (RSV, FLU A&B, COVID)  RVPGX2
Influenza A by PCR: NEGATIVE
Influenza B by PCR: NEGATIVE
Resp Syncytial Virus by PCR: NEGATIVE
SARS Coronavirus 2 by RT PCR: NEGATIVE

## 2023-07-04 LAB — CBC
HCT: 42.1 % (ref 39.0–52.0)
Hemoglobin: 14 g/dL (ref 13.0–17.0)
MCH: 31.9 pg (ref 26.0–34.0)
MCHC: 33.3 g/dL (ref 30.0–36.0)
MCV: 95.9 fL (ref 80.0–100.0)
Platelets: 241 10*3/uL (ref 150–400)
RBC: 4.39 MIL/uL (ref 4.22–5.81)
RDW: 11.6 % (ref 11.5–15.5)
WBC: 8.5 10*3/uL (ref 4.0–10.5)
nRBC: 0 % (ref 0.0–0.2)

## 2023-07-04 NOTE — ED Triage Notes (Signed)
Pt to ed from home via POV for cough, muscle aches, and congestion x 3 weeks. Pt is caox4, in no acute distress and ambulatory in triage. Pt is taking multiple OTC meds at home with no relief.

## 2023-07-05 ENCOUNTER — Emergency Department
Admission: EM | Admit: 2023-07-05 | Discharge: 2023-07-05 | Disposition: A | Discharge status: Home / Self Care | Payer: BC Managed Care – PPO | Attending: Emergency Medicine | Admitting: Emergency Medicine

## 2023-07-05 DIAGNOSIS — J40 Bronchitis, not specified as acute or chronic: Secondary | ICD-10-CM

## 2023-07-05 MED ORDER — PREDNISONE 20 MG PO TABS: 40.0000 mg | ORAL_TABLET | Freq: Every day | ORAL | 0 refills | Status: AC

## 2023-07-05 MED ORDER — AZITHROMYCIN 250 MG PO TABS: ORAL_TABLET | ORAL | 0 refills | Status: AC

## 2023-07-05 MED ORDER — AZITHROMYCIN 500 MG PO TABS
500.0000 mg | ORAL_TABLET | Freq: Once | ORAL | Status: AC
  Administered 2023-07-05: 500 mg via ORAL
  Filled 2023-07-05: qty 1

## 2023-07-05 MED ORDER — PREDNISONE 20 MG PO TABS
60.0000 mg | ORAL_TABLET | Freq: Once | ORAL | Status: AC
  Administered 2023-07-05: 60 mg via ORAL
  Filled 2023-07-05: qty 3

## 2023-07-05 NOTE — ED Provider Notes (Signed)
° °  [facility] Provider Note    Event Date/Time   First MD Initiated Contact with Patient 07/05/23 0041     (approximate)  History   Chief Complaint: Cough (X 3 weeks)  HPI  Jose George is a 52 y.o. male with a past medical history of HIV, presents to the emergency department for 3 weeks of cough and congestion.  According to the patient for the past 3 weeks he has had sinus pressure congestion mucus production and cough.  No known fever.  Patient states he is taken over-the-counter medications without relief so he came to the emergency department.  No chest pain.  Reassuring vital signs 98% on room air.  Afebrile.  Physical Exam   Triage Vital Signs: ED Triage Vitals  Encounter Vitals Group     BP 07/04/23 1857 (!) 145/93     Systolic BP Percentile --      Diastolic BP Percentile --      Pulse Rate 07/04/23 1857 95     Resp 07/04/23 1857 16     Temp 07/04/23 1857 98.4 F (36.9 C)     Temp Source 07/04/23 1857 Oral     SpO2 07/04/23 1857 98 %     Weight --      Height --      Head Circumference --      Peak Flow --      Pain Score 07/04/23 1856 0     Pain Loc --      Pain Education --      Exclude from Growth Chart --     Most recent vital signs: Vitals:   07/04/23 1857  BP: (!) 145/93  Pulse: 95  Resp: 16  Temp: 98.4 F (36.9 C)  SpO2: 98%    General: Awake, no distress.  CV:  Good peripheral perfusion.  Regular rate and rhythm  Resp:  Normal effort.  Equal breath sounds bilaterally.  No wheeze rales or rhonchi Abd:  No distention.   ED Results / Procedures / Treatments   RADIOLOGY  I have reviewed and interpreted chest x-ray images.  No consolidation on my evaluation. Radiology is read the x-ray is negative   MEDICATIONS ORDERED IN ED: Medications  predniSONE (DELTASONE) tablet 60 mg (has no administration in time range)  azithromycin (ZITHROMAX) tablet 500 mg (has no administration in time range)     IMPRESSION /  MDM / ASSESSMENT AND PLAN / ED COURSE  I reviewed the triage vital signs and the nursing notes.  Patient's presentation is most consistent with acute presentation with potential threat to life or bodily function.  Patient presents to the emergency department for 3 weeks of cough congestion sinus pressure.  Overall the patient appears well, no distress, reassuring vital signs, reassuring physical exam.  CBC is normal with a normal white blood cell count, chemistry shows no concerning findings.  Patient's COVID/flu/RSV is negative.  Chest x-ray is clear.  Given 3 weeks of symptoms highly suspect more of an acute bronchitis possible sinus infection as well.  Will cover with Zithromax as well as a 5-day course of prednisone and have the patient follow-up with his PCP.  Provided my typical URI return precautions.  Patient agreeable to plan of care.  FINAL CLINICAL IMPRESSION(S) / ED DIAGNOSES   Upper respiratory infection Bronchitis    Note:  This document was prepared using Dragon voice recognition software and may include unintentional dictation errors.   Minna Antis, MD 07/05/23 [phone]

## 2023-07-06 ENCOUNTER — Ambulatory Visit: Payer: Self-pay | Admitting: Licensed Clinical Social Worker

## 2023-07-06 DIAGNOSIS — F411 Generalized anxiety disorder: Secondary | ICD-10-CM

## 2023-07-06 NOTE — Progress Notes (Signed)
Counselor/Therapist Progress Note  Patient ID: Jose George, MRN: [id number],    Date: 07/06/2023  Time Spent: 51 minutes  I spent 46 minutes on the real-time audio and video with the patient on the date of service. I spent an additional 5  minutes on pre- and post-visit activities on the date of service including collateral, chart review, team discussion, and documentation.   Treatment Type: Psychotherapy  Reported Symptoms:  Anxiety, anxious thoughts and worries   Mental Status Exam:  Appearance:   Casual and Neat     Behavior:  Appropriate, Sharing, and Motivated  Motor:  Normal  Speech/Language:   Clear and Coherent and Normal Rate  Affect:  Appropriate, Congruent, and Full Range  Mood:  normal  Thought process:  normal  Thought content:    WNL  Sensory/Perceptual disturbances:    WNL  Orientation:  oriented to person, place, time/date, situation, and day of week  Attention:  Good  Concentration:  Good  Memory:  WNL  Fund of knowledge:   Good  Insight:    Fair  Judgment:   Fair  Impulse Control:  Fair   Risk Assessment: Danger to Self:  No Self-injurious Behavior: No Danger to Others: No Duty to Warn:no Physical Aggression / Violence:No  Access to Firearms a concern: No  Gang Involvement:No   Subjective: Patient was engaged and cooperative throughout the session using time to discuss thoughts and feelings. Patient describes having multiple stressors over the past three weeks, including being sick, the holiday, and preparing for new roommate. Patient reports that the most helpful coping tool for anxiety is distracting self through talking with someone else. Patient voices continued motivation for treatment.   Interventions: Cognitive Behavioral Therapy and Client Centered  LCSW established psychological safety. LCSW checked in with patient regarding how they have been doing since the last follow-up session. Engaged patient in processing current psychosocial  stressors, intervening with reflective listening and positive regard. LCSW encouraged patient to identify, thoughts, emotions, and skills that help him to cope with anxiety. LCSW taught patient Heart Hug skill, patient was not able to engage completely due to cough.    Diagnosis:   ICD-10-CM   1. Generalized anxiety disorder  F41.1       Plan: Patient's goal of treatment is dealing with emotions, feelings, past problems, and going forward and processing these experiences to deal with them in a rational way.    Treatment Target: Understand the relationship between thoughts, emotions, and behaviors  Psychoeducation on CBTs model   Oriented the client to the therapeutic approach Teach the connection between thoughts, emotions, and behaviors  Enhance emotional awareness and discrimination of emotions  Understand thoughts, emotions, and behaviors  Treatment Target: Increase realistic balanced thinking -to learn how to replace thinking with thoughts that are more accurate or helpful Explore patient's thoughts, beliefs, automatic thoughts, assumptions  Identify and replace unhelpful thinking patterns (upsetting ideas, self-talk and mental images) Process distress and allow for emotional release  Questioning and challenging thoughts Cognitive reappraisal  Restructuring, Socratic questioning  Provided psychoeducation on core beliefs, explore, and assist patient in identifying core beliefs and modify underlying beliefs Treatment Target: Creating a life worth living  Values clarification   Self-care - nutrition, sleep, exercise  Mindfulness skills Interpersonal Effectiveness  Emotion Regulation Distress Tolerance Treatment Target: Increase coping skills to manage panic and panic attacks  Intentional breathing exercises 5 senses Grounding exercises - holding ice cube, ice on forehead, washing hands in cold water Treatment Target: Increase  psychological flexibility (Acceptance, defusion,  observer self/perspective-taking, present moment awareness, values and committed action) Teach mindfulness skills  Psychoeducation about cognitive fusion- Hand as thoughts metaphor  Increase awareness of control vs. acceptance Reduce avoidance of certain emotions or situations Discuss "unhooking" and emphasize that thoughts and feelings do not always lead to action Increasing self-knowledge  Getting in touch with the observant self  Values-guided behavioral change  Future Appointments  Date Time Provider Department Center  07/20/2023  3:00 PM Kathreen Cosier, LCSW [facility] None     Kathreen Cosier, LCSW

## 2023-07-15 ENCOUNTER — Ambulatory Visit: Admit: 2023-07-15

## 2023-07-20 ENCOUNTER — Ambulatory Visit: Payer: Self-pay | Admitting: Licensed Clinical Social Worker

## 2023-07-20 DIAGNOSIS — F411 Generalized anxiety disorder: Secondary | ICD-10-CM

## 2023-07-20 NOTE — Progress Notes (Signed)
 Counselor/Therapist Progress Note  Patient ID: Jose George, MRN: [id number],    Date: 07/20/2023  Time Spent: 55 minutes I spent 50 minutes on the real-time audio and video with the patient on the date of service. I spent an additional 5 minutes on pre- and post-visit activities on the date of service including collateral, chart review, team discussion, and documentation.    Treatment Type: Psychotherapy  Reported Symptoms:  Mild anxiety, anxious thoughts  Mental Status Exam:  Appearance:   Casual and Neat     Behavior:  Appropriate, Sharing, and Motivated  Motor:  Normal  Speech/Language:   Clear and Coherent and Normal Rate  Affect:  Appropriate, Congruent, and Full Range  Mood:  normal  Thought process:  normal  Thought content:    WNL  Sensory/Perceptual disturbances:    WNL  Orientation:  oriented to person, place, time/date, situation, and day of week  Attention:  Good  Concentration:  Good  Memory:  WNL  Fund of knowledge:   Good  Insight:    Fair  Judgment:   Fair  Impulse Control:  Fair   Risk Assessment: Danger to Self:  No Self-injurious Behavior: No Danger to Others: No Duty to Warn:no Physical Aggression / Violence:No  Access to Firearms a concern: No  Gang Involvement:No   Subjective: Patient was engaged and cooperative throughout the session using time effectively to discuss thoughts and feelings. He describes thinking a lot due to new roommate and complicated interactions. He describes anxious and intrusive thoughts leading to some mild distress and anxiety. Patient voices continued motivation for treatment.  Interventions: Cognitive Behavioral Therapy and Client Centered  LCSW established psychological safety. LCSW checked in with patient regarding how they have been doing since the last follow-up session. Engaged him in processing his thoughts and emotions related to complicated relationship issues with roommate. LCSW validated patient's feelings and  normalized his perceptions, provided reflective listening, highlighted unhelpful thought patterns and reframed thoughts leading to increased distress, provided psychoeducation on intrusive thoughts, taught patient to refocus on what he does know vs what he does not know, explored options for open communication, and rehearsed possible ways to have open conversation.   Diagnosis:   ICD-10-CM   1. Generalized anxiety disorder  F41.1      Plan: Patient's goal of treatment is dealing with emotions, feelings, past problems, and going forward and processing these experiences to deal with them in a rational way.    Treatment Target: Understand the relationship between thoughts, emotions, and behaviors  Psychoeducation on CBTs model   Oriented the client to the therapeutic approach Teach the connection between thoughts, emotions, and behaviors  Enhance emotional awareness and discrimination of emotions  Understand thoughts, emotions, and behaviors  Treatment Target: Increase realistic balanced thinking -to learn how to replace thinking with thoughts that are more accurate or helpful Explore patient's thoughts, beliefs, automatic thoughts, assumptions  Identify and replace unhelpful thinking patterns (upsetting ideas, self-talk and mental images) Process distress and allow for emotional release  Questioning and challenging thoughts Cognitive reappraisal  Restructuring, Socratic questioning  Provided psychoeducation on core beliefs, explore, and assist patient in identifying core beliefs and modify underlying beliefs Treatment Target: Creating a life worth living  Values clarification   Self-care - nutrition, sleep, exercise  Mindfulness skills Interpersonal Effectiveness  Emotion Regulation Distress Tolerance Treatment Target: Increase coping skills to manage panic and panic attacks  Intentional breathing exercises 5 senses Grounding exercises - holding ice cube, ice on forehead, washing hands  in cold water Treatment Target: Increase psychological flexibility (Acceptance, defusion, observer self/perspective-taking, present moment awareness, values and committed action) Teach mindfulness skills  Psychoeducation about cognitive fusion- Hand as thoughts metaphor  Increase awareness of control vs. acceptance Reduce avoidance of certain emotions or situations Discuss "unhooking" and emphasize that thoughts and feelings do not always lead to action Increasing self-knowledge  Getting in touch with the observant self  Values-guided behavioral change  Future Appointments  Date Time Provider Department Center  08/04/2023  3:00 PM Ellender Palma, LCSW [facility] None    Palma Ellender, LCSW

## 2023-08-04 ENCOUNTER — Ambulatory Visit: Payer: Self-pay | Admitting: Licensed Clinical Social Worker

## 2023-08-04 DIAGNOSIS — F411 Generalized anxiety disorder: Secondary | ICD-10-CM

## 2023-08-04 NOTE — Progress Notes (Signed)
Counselor/Therapist Progress Note  Patient ID: Jose George, MRN: [id number],    Date: 08/04/2023  Time Spent: 50 total minutes. I spent 45 minutes on the real-time audio and video with the patient on the date of service. I spent an additional 5  minutes on pre- and post-visit activities on the date of service including collateral, chart review, team discussion, and documentation.   Treatment Type: Psychotherapy  Reported Symptoms:  Stable mood  Mental Status Exam:  Appearance:   Casual and Neat     Behavior:  Appropriate, Sharing, and Motivated  Motor:  Normal  Speech/Language:   Clear and Coherent and Normal Rate  Affect:  Appropriate, Congruent, and Full Range  Mood:  normal  Thought process:  normal  Thought content:    WNL  Sensory/Perceptual disturbances:    WNL  Orientation:  oriented to person, place, time/date, situation, and day of week  Attention:  Good  Concentration:  Good  Memory:  WNL  Fund of knowledge:   Good  Insight:    Fair  Judgment:   Fair  Impulse Control:  Fair   Risk Assessment: Danger to Self:  No Self-injurious Behavior: No Danger to Others: No Duty to Warn:no Physical Aggression / Violence:No  Access to Firearms a concern: No  Gang Involvement:No   Subjective: Patient was engaged and cooperative throughout the session using time to discuss thoughts and feelings. Patient reports that his mood has been pretty good, enjoying having a roommate but has some worries about the interactions. Patient was receptive to feedback and intervention from Jose George.   Interventions: Cognitive Behavioral Therapy and Client Centered  Jose George established psychological safety. Jose George met with patient and checked in with regarding how they have been doing since the last follow-up session. Jose George assisted patient in processing their thoughts and emotions regarding ambivalence related to new roommate.  Jose George explored patient's ambivalence providing reflective feedback and  highlighting patient's values driven behaviors, and discussed options for increasing open communication with the roommate. Encouraged patient to increase engagement in activities of self-care.   Diagnosis:   ICD-10-CM   1. Generalized anxiety disorder  F41.1      Plan:  Patient's goal of treatment is dealing with emotions, feelings, past problems, and going forward and processing these experiences to deal with them in a rational way.    Treatment Target: Understand the relationship between thoughts, emotions, and behaviors  Psychoeducation on CBTs model   Oriented the client to the therapeutic approach Teach the connection between thoughts, emotions, and behaviors  Enhance emotional awareness and discrimination of emotions  Understand thoughts, emotions, and behaviors  Treatment Target: Increase realistic balanced thinking -to learn how to replace thinking with thoughts that are more accurate or helpful Explore patient's thoughts, beliefs, automatic thoughts, assumptions  Identify and replace unhelpful thinking patterns (upsetting ideas, self-talk and mental images) Process distress and allow for emotional release  Questioning and challenging thoughts Cognitive reappraisal  Restructuring, Socratic questioning  Provided psychoeducation on core beliefs, explore, and assist patient in identifying core beliefs and modify underlying beliefs Treatment Target: Creating a life worth living  Values clarification   Self-care - nutrition, sleep, exercise  Mindfulness skills Interpersonal Effectiveness  Emotion Regulation Distress Tolerance Treatment Target: Increase coping skills to manage panic and panic attacks  Intentional breathing exercises 5 senses Grounding exercises - holding ice cube, ice on forehead, washing hands in cold water Treatment Target: Increase psychological flexibility (Acceptance, defusion, observer self/perspective-taking, present moment awareness, values and committed  action) Teach  mindfulness skills  Psychoeducation about cognitive fusion- Hand as thoughts metaphor  Increase awareness of control vs. acceptance Reduce avoidance of certain emotions or situations Discuss "unhooking" and emphasize that thoughts and feelings do not always lead to action Increasing self-knowledge  Getting in touch with the observant self  Values-guided behavioral change  Future Appointments  Date Time Provider Department Center  08/18/2023  3:00 PM Jose Cosier, Jose George [facility] None     Jose Cosier, Jose George

## 2023-08-18 ENCOUNTER — Ambulatory Visit: Payer: Self-pay | Admitting: Licensed Clinical Social Worker

## 2023-08-18 DIAGNOSIS — F411 Generalized anxiety disorder: Secondary | ICD-10-CM

## 2023-08-18 NOTE — Progress Notes (Signed)
Counselor/Therapist Progress Note  Patient ID: Jose George, MRN: [id number],    Date: 08/18/2023  Time Spent: 50 total minutes. I spent on real-time audio and video with the patient on the date of service. I spent an additional 5 minutes on pre- and post-visit activities on the date of service including collateral, chart review, team discussion, and documentation.   Treatment Type: Psychotherapy  Reported Symptoms:  overall stable mood; mild anxiety, anxious thoughts, worries   Mental Status Exam:  Appearance:   Casual     Behavior:  Appropriate, Sharing, and Motivated  Motor:  Normal  Speech/Language:   Clear and Coherent and Normal Rate  Affect:  Appropriate, Congruent, and Full Range  Mood:  normal  Thought process:  normal  Thought content:    WNL  Sensory/Perceptual disturbances:    WNL  Orientation:  oriented to person, place, time/date, situation, and day of week  Attention:  Good  Concentration:  Good  Memory:  WNL  Fund of knowledge:   Good  Insight:    Fair  Judgment:   Fair  Impulse Control:  Fair   Risk Assessment: Danger to Self:  No Self-injurious Behavior: No Danger to Others: No Duty to Warn:no Physical Aggression / Violence:No  Access to Firearms a concern: No  Gang Involvement:No   Subjective: Patient was engaged and cooperative throughout the session using time to discuss thoughts and feelings. Patient reports that overall his mood has been stable. Patient used the majority of the session to process unresolved childhood issues and how they continue to impact him. Patient voices continued motivation for treatment.    Interventions: Cognitive Behavioral Therapy and Client Centered  LCSW established psychological safety. LCSW met with patient to identify needs related to stressors and functioning, and assess and monitor for signs and symptoms of anxiety and depressed mood, and assess safety. Checked in with patient regarding how they have been  doing since the last follow-up session. Explored patient's perception of not getting attention in childhood and how that shows up in his relationships currently. LCSW discussed trust issues and self-worth with patient, highlighted unhelpful thought patterns and challenged and reframed these thoughts, and encouraged patient to notice when he see's these patterns and to observe and check-in with himself. Provided support through active listening, validation of feelings, and highlighted patient's strengths.  Diagnosis:   ICD-10-CM   1. Generalized anxiety disorder  F41.1      Plan:  Patient's goal of treatment is dealing with emotions, feelings, past problems, and going forward and processing these experiences to deal with them in a rational way.    Treatment Target: Understand the relationship between thoughts, emotions, and behaviors  Psychoeducation on CBTs model   Oriented the client to the therapeutic approach Teach the connection between thoughts, emotions, and behaviors  Enhance emotional awareness and discrimination of emotions  Understand thoughts, emotions, and behaviors  Treatment Target: Increase realistic balanced thinking -to learn how to replace thinking with thoughts that are more accurate or helpful Explore patient's thoughts, beliefs, automatic thoughts, assumptions  Identify and replace unhelpful thinking patterns (upsetting ideas, self-talk and mental images) Process distress and allow for emotional release  Questioning and challenging thoughts Cognitive reappraisal  Restructuring, Socratic questioning  Provided psychoeducation on core beliefs, explore, and assist patient in identifying core beliefs and modify underlying beliefs Treatment Target: Creating a life worth living  Values clarification   Self-care - nutrition, sleep, exercise  Mindfulness skills Interpersonal Effectiveness  Emotion Regulation Distress Tolerance Treatment  Target: Increase coping skills to  manage panic and panic attacks  Intentional breathing exercises 5 senses Grounding exercises - holding ice cube, ice on forehead, washing hands in cold water Treatment Target: Increase psychological flexibility (Acceptance, defusion, observer self/perspective-taking, present moment awareness, values and committed action) Teach mindfulness skills  Psychoeducation about cognitive fusion- Hand as thoughts metaphor  Increase awareness of control vs. acceptance Reduce avoidance of certain emotions or situations Discuss "unhooking" and emphasize that thoughts and feelings do not always lead to action Increasing self-knowledge  Getting in touch with the observant self  Values-guided behavioral change  Future Appointments  Date Time Provider Department Center  09/01/2023  3:00 PM Kathreen Cosier, LCSW [facility] None    Kathreen Cosier, LCSW

## 2023-08-19 ENCOUNTER — Ambulatory Visit: Admit: 2023-08-19 | Discharge: 2023-08-20 | Payer: PRIVATE HEALTH INSURANCE

## 2023-08-19 DIAGNOSIS — Z1211 Encounter for screening for malignant neoplasm of colon: Principal | ICD-10-CM

## 2023-08-19 DIAGNOSIS — Z8619 Personal history of other infectious and parasitic diseases: Principal | ICD-10-CM

## 2023-08-19 DIAGNOSIS — I10 Essential (primary) hypertension: Principal | ICD-10-CM

## 2023-08-19 DIAGNOSIS — Z23 Encounter for immunization: Principal | ICD-10-CM

## 2023-08-19 DIAGNOSIS — B20 Human immunodeficiency virus [HIV] disease: Principal | ICD-10-CM

## 2023-08-19 DIAGNOSIS — Z79899 Other long term (current) drug therapy: Principal | ICD-10-CM

## 2023-08-19 DIAGNOSIS — Z1151 Encounter for screening for human papillomavirus (HPV): Principal | ICD-10-CM

## 2023-08-19 DIAGNOSIS — Z113 Encounter for screening for infections with a predominantly sexual mode of transmission: Principal | ICD-10-CM

## 2023-08-19 DIAGNOSIS — Z9189 Other specified personal risk factors, not elsewhere classified: Principal | ICD-10-CM

## 2023-08-19 DIAGNOSIS — J329 Chronic sinusitis, unspecified: Principal | ICD-10-CM

## 2023-08-19 DIAGNOSIS — Z13220 Encounter for screening for lipoid disorders: Principal | ICD-10-CM

## 2023-08-19 DIAGNOSIS — Z720 Tobacco use: Principal | ICD-10-CM

## 2023-08-19 DIAGNOSIS — Z21 Asymptomatic human immunodeficiency virus [HIV] infection status: Principal | ICD-10-CM

## 2023-08-19 DIAGNOSIS — E782 Mixed hyperlipidemia: Principal | ICD-10-CM

## 2023-08-19 DIAGNOSIS — Z5181 Encounter for therapeutic drug level monitoring: Principal | ICD-10-CM

## 2023-08-19 DIAGNOSIS — Z1159 Encounter for screening for other viral diseases: Principal | ICD-10-CM

## 2023-08-19 MED ORDER — AMLODIPINE 5 MG TABLET
ORAL_TABLET | Freq: Every day | ORAL | 3 refills | 90 days | Status: CP
Start: 2023-08-19 — End: 2024-08-18

## 2023-08-19 MED ORDER — ROSUVASTATIN 10 MG TABLET
ORAL_TABLET | Freq: Every day | ORAL | 3 refills | 90.00 days | Status: CP
Start: 2023-08-19 — End: 2024-08-13

## 2023-08-19 MED ORDER — BICTEGRAVIR 50 MG-EMTRICITABINE 200 MG-TENOFOVIR ALAFENAM 25 MG TABLET
ORAL_TABLET | Freq: Every day | ORAL | 11 refills | 30.00 days | Status: CP
Start: 2023-08-19 — End: ?

## 2023-08-19 MED ORDER — FLUTICASONE PROPIONATE 50 MCG/ACTUATION NASAL SPRAY,SUSPENSION
Freq: Every day | NASAL | 1 refills | 120.00 days | Status: CP
Start: 2023-08-19 — End: ?

## 2023-08-21 DIAGNOSIS — Z9189 Other specified personal risk factors, not elsewhere classified: Principal | ICD-10-CM

## 2023-08-21 MED ORDER — DOXYCYCLINE MONOHYDRATE 100 MG CAPSULE
ORAL_CAPSULE | Freq: Once | ORAL | 0 refills | 0.00 days | Status: CP | PRN
Start: 2023-08-21 — End: ?

## 2023-09-01 ENCOUNTER — Ambulatory Visit: Payer: Self-pay | Admitting: Licensed Clinical Social Worker

## 2023-09-01 DIAGNOSIS — F411 Generalized anxiety disorder: Secondary | ICD-10-CM

## 2023-09-01 NOTE — Progress Notes (Signed)
 Counselor/Therapist Progress Note  Patient ID: Jose George, MRN: [id number],    Date: 09/01/2023  Time Spent: 52 total minutes. I spent 47 minutes on real-time audio and video with the patient on the date of service. I spent an additional 5 minutes on pre- and post-visit activities on the date of service including collateral, chart review, team discussion, and documentation.     Treatment Type: Psychotherapy  Reported Symptoms:  depressed mood, anxiety, anxious thoughts, worries    Mental Status Exam:  Appearance:   Casual     Behavior:  Appropriate and Sharing  Motor:  Normal  Speech/Language:   Clear and Coherent and Normal Rate  Affect:  Appropriate, Congruent, and Full Range  Mood:  normal  Thought process:  normal  Thought content:    WNL  Sensory/Perceptual disturbances:    WNL  Orientation:  oriented to person, place, time/date, situation, and day of week  Attention:  Good  Concentration:  Good  Memory:  WNL  Fund of knowledge:   Good  Insight:    Fair  Judgment:   Fair  Impulse Control:  Fair   Risk Assessment: Danger to Self:  No Self-injurious Behavior: No Danger to Others: No Duty to Warn:no Physical Aggression / Violence:No  Access to Firearms a concern: No  Gang Involvement:No   Subjective: Patient was engaged and cooperative throughout the session using time to discuss thoughts and feelings. Patient reports that he has been depressed due to not being able to find a committed partner. Patient voices continued motivation for treatment. He reports benefit from heart hug skill practiced in session.    Interventions: Cognitive Behavioral Therapy and client centered  LCSW established psychological safety. LCSW met with patient to identify needs related to stressors and functioning, and assess and monitor for signs and symptoms of anxiety and depressed mood, and assess safety. Checked in with patient regarding how they have been doing since the last follow-up  session. LCSW assisted patient in processing their thoughts and emotions regarding challenges in dating, feelings of sadness and worry regarding not being able to find a committed partner. LCSW validated patient's feelings of sadness, encouraged patient to identify thoughts, feelings, and sensations and explored ways for patient to focus on ways to resource himself and come into the present moment. LCSW led patient in resourcing exercise - heart hug.   Diagnosis:   ICD-10-CM   1. Generalized anxiety disorder  F41.1      Plan:  Patient's goal of treatment is dealing with emotions, feelings, past problems, and going forward and processing these experiences to deal with them in a rational way.    Treatment Target: Understand the relationship between thoughts, emotions, and behaviors  Psychoeducation on CBTs model   Oriented the client to the therapeutic approach Teach the connection between thoughts, emotions, and behaviors  Enhance emotional awareness and discrimination of emotions  Understand thoughts, emotions, and behaviors  Treatment Target: Increase realistic balanced thinking -to learn how to replace thinking with thoughts that are more accurate or helpful Explore patient's thoughts, beliefs, automatic thoughts, assumptions  Identify and replace unhelpful thinking patterns (upsetting ideas, self-talk and mental images) Process distress and allow for emotional release  Questioning and challenging thoughts Cognitive reappraisal  Restructuring, Socratic questioning  Provided psychoeducation on core beliefs, explore, and assist patient in identifying core beliefs and modify underlying beliefs Treatment Target: Creating a life worth living  Values clarification   Self-care - nutrition, sleep, exercise  Mindfulness skills Interpersonal Effectiveness  Emotion  Regulation Distress Tolerance Treatment Target: Increase coping skills to manage panic and panic attacks  Intentional breathing  exercises 5 senses Grounding exercises - holding ice cube, ice on forehead, washing hands in cold water Treatment Target: Increase psychological flexibility (Acceptance, defusion, observer self/perspective-taking, present moment awareness, values and committed action) Teach mindfulness skills  Psychoeducation about cognitive fusion- Hand as thoughts metaphor  Increase awareness of control vs. acceptance Reduce avoidance of certain emotions or situations Discuss "unhooking" and emphasize that thoughts and feelings do not always lead to action Increasing self-knowledge  Getting in touch with the observant self  Values-guided behavioral change  Future Appointments  Date Time Provider Department Center  09/08/2023  3:00 PM Kathreen Cosier, LCSW [facility] None    Kathreen Cosier, LCSW

## 2023-09-08 ENCOUNTER — Ambulatory Visit: Payer: Self-pay | Admitting: Licensed Clinical Social Worker

## 2023-09-08 DIAGNOSIS — F411 Generalized anxiety disorder: Secondary | ICD-10-CM

## 2023-09-08 NOTE — Progress Notes (Signed)
 Counselor/Therapist Progress Note  Patient ID: Jose George, MRN: [id number],    Date: 09/08/2023  Time Spent: 46 total minutes. I spent 41 minutes on real-time audio and video with the patient on the date of service. I spent an additional 5 minutes on pre- and post-visit activities on the date of service including collateral, chart review, team discussion, and documentation.    Treatment Type: Psychotherapy  Reported Symptoms:  Decrease in anxiety, overall mood stability   Mental Status Exam:  Appearance:   Casual     Behavior:  Appropriate, Sharing, and Resistant  Motor:  Normal  Speech/Language:   Clear and Coherent and Normal Rate  Affect:  Appropriate, Congruent, and Full Range  Mood:  normal  Thought process:  normal  Thought content:    WNL  Sensory/Perceptual disturbances:    WNL  Orientation:  oriented to person, place, time/date, situation, and day of week  Attention:  Good  Concentration:  Good  Memory:  WNL  Fund of knowledge:   Good  Insight:    Fair  Judgment:   Fair  Impulse Control:  Fair   Risk Assessment: Danger to Self:  No Self-injurious Behavior: No Danger to Others: No Duty to Warn:no Physical Aggression / Violence:No  Access to Firearms a concern: No  Gang Involvement:No   Subjective: Patient was engaged and cooperative throughout the session using time to discuss thoughts and feelings. Patient reports that his mood has been calmer this week, but continue to have anxious thoughts. Patient was variably receptive to feedback and intervention from LCSW. Patient acknowledged that he can try to notice thoughts vs thinking and can try to engage in an action to manage anxiety. Patient voices continued motivation for treatment.  Interventions: Cognitive Behavioral Therapy and Client Centered  LCSW established psychological safety. LCSW met with patient to identify needs related to stressors and functioning, and assess and monitor for signs and symptoms of  anxiety and depressed mood, and assess safety. Checked in with patient regarding how they have been doing since the last follow-up session. LCSW taught patient about thoughts vs thinking (coming up with stories), reviewed engaging in coping strategies, such as going for a walk, journaling, going to the gym, taking a shower, and/or watching funny videos.  Diagnosis:   ICD-10-CM   1. Generalized anxiety disorder  F41.1       Plan:  Patient's goal of treatment is dealing with emotions, feelings, past problems, and going forward and processing these experiences to deal with them in a rational way.    Treatment Target: Understand the relationship between thoughts, emotions, and behaviors  Psychoeducation on CBTs model   Oriented the client to the therapeutic approach Teach the connection between thoughts, emotions, and behaviors  Enhance emotional awareness and discrimination of emotions  Understand thoughts, emotions, and behaviors  Treatment Target: Increase realistic balanced thinking -to learn how to replace thinking with thoughts that are more accurate or helpful Explore patient's thoughts, beliefs, automatic thoughts, assumptions  Identify and replace unhelpful thinking patterns (upsetting ideas, self-talk and mental images) Process distress and allow for emotional release  Questioning and challenging thoughts Cognitive reappraisal  Restructuring, Socratic questioning  Provided psychoeducation on core beliefs, explore, and assist patient in identifying core beliefs and modify underlying beliefs Treatment Target: Creating a life worth living  Values clarification   Self-care - nutrition, sleep, exercise  Mindfulness skills Interpersonal Effectiveness  Emotion Regulation Distress Tolerance Treatment Target: Increase coping skills to manage panic and panic attacks  Intentional  breathing exercises 5 senses Grounding exercises - holding ice cube, ice on forehead, washing hands in cold  water Treatment Target: Increase psychological flexibility (Acceptance, defusion, observer self/perspective-taking, present moment awareness, values and committed action) Teach mindfulness skills  Psychoeducation about cognitive fusion- Hand as thoughts metaphor  Increase awareness of control vs. acceptance Reduce avoidance of certain emotions or situations Discuss "unhooking" and emphasize that thoughts and feelings do not always lead to action Increasing self-knowledge  Getting in touch with the observant self  Values-guided behavioral change Future Appointments  Date Time Provider Department Center  09/22/2023  3:00 PM Kathreen Cosier, LCSW [facility] None     Kathreen Cosier, LCSW

## 2023-09-22 ENCOUNTER — Ambulatory Visit: Payer: Self-pay | Admitting: Licensed Clinical Social Worker

## 2023-09-22 DIAGNOSIS — F411 Generalized anxiety disorder: Secondary | ICD-10-CM

## 2023-09-22 NOTE — Progress Notes (Signed)
 Counselor/Therapist Progress Note  Patient ID: Jose George, MRN: [id number],    Date: 09/22/2023  Time Spent: ## total minutes. I spent ## minutes on real-time audio and video with the patient on the date of service. I spent an additional 5 minutes on pre- and post-visit activities on the date of service including collateral, chart review, team discussion, and documentation.     Treatment Type: Psychotherapy  Reported Symptoms:  Overall stable, moments of depressed mood, ruminating thoughts; anxiety, anxious thoughts   Mental Status Exam:  Appearance:   Casual and Neat     Behavior:  Appropriate, Sharing, and Motivated  Motor:  Normal  Speech/Language:   Clear and Coherent and Normal Rate  Affect:  Appropriate, Congruent, and Full Range  Mood:  normal  Thought process:  normal  Thought content:    WNL  Sensory/Perceptual disturbances:    WNL  Orientation:  oriented to person, place, time/date, situation, and day of week  Attention:  Good  Concentration:  Good  Memory:  WNL  Fund of knowledge:   Good  Insight:    Fair  Judgment:   Fair  Impulse Control:  Fair   Risk Assessment: Danger to Self:  No Self-injurious Behavior: No Danger to Others: No Duty to Warn:no Physical Aggression / Violence:No  Access to Firearms a concern: No  Gang Involvement:No   Subjective: Patient shares that he has been okay, some moments of feeling depressed mood due to unresolved feelings related to ex-boyfriend. Patient was engaged and cooperative throughout the session using time to discuss thoughts and feelings. The session focused on unresolved issues related to his ex. Patient reports benefit from engaging with friends over the weekend. Patient was receptive to feedback and intervention from LCSW.        Interventions: Cognitive Behavioral Therapy and Client Centered  LCSW established psychological safety. LCSW met with patient to identify needs related to stressors and functioning, and  assess and monitor for signs and symptoms of depression and anxiety, and assess safety. Checked in with patient regarding how they have been doing since the last follow-up session. LCSW assisted patient in processing their thoughts and emotions regarding unresolved feelings related to ex-boyfriend. LCSW intervened with positive regard and optimism to validate patient's emotions, reviewed Radical Acceptance, and supported client in exploring ways to increase engagement in activities that feel resourcing to him.   Diagnosis:   ICD-10-CM   1. Generalized anxiety disorder  F41.1      Plan:  Patient's goal of treatment is dealing with emotions, feelings, past problems, and going forward and processing these experiences to deal with them in a rational way.    Treatment Target: Understand the relationship between thoughts, emotions, and behaviors  Psychoeducation on CBTs model   Oriented the client to the therapeutic approach Teach the connection between thoughts, emotions, and behaviors  Enhance emotional awareness and discrimination of emotions  Understand thoughts, emotions, and behaviors  Treatment Target: Increase realistic balanced thinking -to learn how to replace thinking with thoughts that are more accurate or helpful Explore patient's thoughts, beliefs, automatic thoughts, assumptions  Identify and replace unhelpful thinking patterns (upsetting ideas, self-talk and mental images) Process distress and allow for emotional release  Questioning and challenging thoughts Cognitive reappraisal  Restructuring, Socratic questioning  Provided psychoeducation on core beliefs, explore, and assist patient in identifying core beliefs and modify underlying beliefs Treatment Target: Creating a life worth living  Values clarification   Self-care - nutrition, sleep, exercise  Mindfulness skills Interpersonal  Effectiveness  Emotion Regulation Distress Tolerance Treatment Target: Increase coping skills  to manage panic and panic attacks  Intentional breathing exercises 5 senses Grounding exercises - holding ice cube, ice on forehead, washing hands in cold water Treatment Target: Increase psychological flexibility (Acceptance, defusion, observer self/perspective-taking, present moment awareness, values and committed action) Teach mindfulness skills  Psychoeducation about cognitive fusion- Hand as thoughts metaphor  Increase awareness of control vs. acceptance Reduce avoidance of certain emotions or situations Discuss "unhooking" and emphasize that thoughts and feelings do not always lead to action Increasing self-knowledge  Getting in touch with the observant self  Values-guided behavioral change  Future Appointments  Date Time Provider Department Center  10/06/2023  3:00 PM Kathreen Cosier, LCSW [facility] None    Kathreen Cosier, LCSW

## 2023-10-06 ENCOUNTER — Ambulatory Visit: Payer: Self-pay | Admitting: Licensed Clinical Social Worker

## 2023-10-06 DIAGNOSIS — F411 Generalized anxiety disorder: Secondary | ICD-10-CM

## 2023-10-06 NOTE — Progress Notes (Signed)
 Counselor/Therapist Progress Note  Patient ID: Jose George, MRN: [id number],    Date: 10/06/2023  Time Spent: 50 total minutes. I spent 45 minutes on real-time audio and video with the patient on the date of service. I spent an additional 5 minutes on pre- and post-visit activities on the date of service including collateral, chart review, team discussion, and documentation.     Treatment Type: Psychotherapy  Reported Symptoms:  Current mood stability; a week ago had about 2.5 days of feeling very anxious, panicky, racing thoughts, pacing around   Mental Status Exam:  Appearance:   Casual and Neat     Behavior:  Appropriate, Sharing, and Motivated  Motor:  Normal  Speech/Language:   Clear and Coherent and Normal Rate  Affect:  Appropriate, Congruent, and Full Range  Mood:  normal  Thought process:  normal  Thought content:    WNL  Sensory/Perceptual disturbances:    WNL  Orientation:  oriented to person, place, time/date, situation, and day of week  Attention:  Good  Concentration:  Good  Memory:  WNL  Fund of knowledge:   Good  Insight:    Fair  Judgment:   Fair  Impulse Control:  Fair   Risk Assessment: Danger to Self:  No Self-injurious Behavior: No Danger to Others: No Duty to Warn:no Physical Aggression / Violence:No  Access to Firearms a concern: No  Gang Involvement:No   Subjective: Patient shares that about a week ago he had about 2.5 days of feeling increased anxiety, panicky feeling, and pacing around. He shares he had difficulty managing the symptoms- did try to engage in various things to refocus and it helped some but would return. He shares that these episodes happen less and less the more he learns about himself, but when it does happen it is difficult to use the tools that he has. Patient was engaged throughout the session using time to discuss thoughts and feelings. Patient voices continued motivation for treatment. Patient voiced understanding that [facility]  plans to implement billing and he has options to close out services and/or follow the new procedures when they are released.     Interventions: Cognitive Behavioral Therapy and Client Centered  LCSW established psychological safety. LCSW met with patient to identify needs related to stressors and functioning, and assess and monitor for signs and symptoms of anxiety and depressed mood, and assess safety. Checked in with patient regarding how they have been doing since the last follow-up session. LCSW explored patient's recent experience of increased anxiety and activation. LCSW reviewed mindfulness practices, other self-soothing actions, including going for a walk, and discussed possible unknown explanations for these symptoms-beign activated from a scent, date, time many various things. LCSW assisted patient in processing their thoughts and emotions regarding challenges at work. LCSW validated patient's experience, highlighted unhelpful thoughts, and reframed thoughts leading to increased anxiety. Provided support through active listening, validation of feelings, and highlighted patient's strengths. LCSW discussed [facility] transition to billing in July with patient.   Diagnosis:   ICD-10-CM   1. Generalized anxiety disorder  F41.1      Plan: Patient's goal of treatment is dealing with emotions, feelings, past problems, and going forward and processing these experiences to deal with them in a rational way.    Treatment Target: Understand the relationship between thoughts, emotions, and behaviors  Psychoeducation on CBTs model   Oriented the client to the therapeutic approach Teach the connection between thoughts, emotions, and behaviors  Enhance emotional awareness and discrimination of emotions  Understand thoughts, emotions, and behaviors  Treatment Target: Increase realistic balanced thinking -to learn how to replace thinking with thoughts that are more accurate or helpful Explore patient's  thoughts, beliefs, automatic thoughts, assumptions  Identify and replace unhelpful thinking patterns (upsetting ideas, self-talk and mental images) Process distress and allow for emotional release  Questioning and challenging thoughts Cognitive reappraisal  Restructuring, Socratic questioning  Provided psychoeducation on core beliefs, explore, and assist patient in identifying core beliefs and modify underlying beliefs Treatment Target: Creating a life worth living  Values clarification   Self-care - nutrition, sleep, exercise  Mindfulness skills Interpersonal Effectiveness  Emotion Regulation Distress Tolerance Treatment Target: Increase coping skills to manage panic and panic attacks  Intentional breathing exercises 5 senses Grounding exercises - holding ice cube, ice on forehead, washing hands in cold water Treatment Target: Increase psychological flexibility (Acceptance, defusion, observer self/perspective-taking, present moment awareness, values and committed action) Teach mindfulness skills  Psychoeducation about cognitive fusion- Hand as thoughts metaphor  Increase awareness of control vs. acceptance Reduce avoidance of certain emotions or situations Discuss "unhooking" and emphasize that thoughts and feelings do not always lead to action Increasing self-knowledge  Getting in touch with the observant self  Values-guided behavioral change  Future Appointments  Date Time Provider Department Center  10/20/2023  3:00 PM Kathreen Cosier, LCSW [facility] None     Kathreen Cosier, LCSW

## 2023-10-13 ENCOUNTER — Other Ambulatory Visit: Payer: Self-pay

## 2023-10-13 ENCOUNTER — Emergency Department

## 2023-10-13 ENCOUNTER — Emergency Department
Admission: EM | Admit: 2023-10-13 | Discharge: 2023-10-13 | Disposition: A | Discharge status: Home / Self Care | Attending: Emergency Medicine | Admitting: Emergency Medicine

## 2023-10-13 DIAGNOSIS — Z21 Asymptomatic human immunodeficiency virus [HIV] infection status: Secondary | ICD-10-CM | POA: Insufficient documentation

## 2023-10-13 DIAGNOSIS — R079 Chest pain, unspecified: Secondary | ICD-10-CM

## 2023-10-13 DIAGNOSIS — I10 Essential (primary) hypertension: Secondary | ICD-10-CM | POA: Diagnosis not present

## 2023-10-13 DIAGNOSIS — R519 Headache, unspecified: Secondary | ICD-10-CM | POA: Diagnosis present

## 2023-10-13 HISTORY — DX: Essential (primary) hypertension: I10

## 2023-10-13 LAB — CBC WITH DIFFERENTIAL/PLATELET
Abs Immature Granulocytes: 0.03 10*3/uL (ref 0.00–0.07)
Basophils Absolute: 0.1 10*3/uL (ref 0.0–0.1)
Basophils Relative: 1 %
Eosinophils Absolute: 0.3 10*3/uL (ref 0.0–0.5)
Eosinophils Relative: 4 %
HCT: 41.1 % (ref 39.0–52.0)
Hemoglobin: 14.3 g/dL (ref 13.0–17.0)
Immature Granulocytes: 0 %
Lymphocytes Relative: 35 %
Lymphs Abs: 2.7 10*3/uL (ref 0.7–4.0)
MCH: 32.4 pg (ref 26.0–34.0)
MCHC: 34.8 g/dL (ref 30.0–36.0)
MCV: 93.2 fL (ref 80.0–100.0)
Monocytes Absolute: 0.6 10*3/uL (ref 0.1–1.0)
Monocytes Relative: 8 %
Neutro Abs: 4 10*3/uL (ref 1.7–7.7)
Neutrophils Relative %: 52 %
Platelets: 174 10*3/uL (ref 150–400)
RBC: 4.41 MIL/uL (ref 4.22–5.81)
RDW: 12 % (ref 11.5–15.5)
WBC: 7.7 10*3/uL (ref 4.0–10.5)
nRBC: 0 % (ref 0.0–0.2)

## 2023-10-13 LAB — COMPREHENSIVE METABOLIC PANEL WITH GFR
ALT: 21 U/L (ref 0–44)
AST: 20 U/L (ref 15–41)
Albumin: 4 g/dL (ref 3.5–5.0)
Alkaline Phosphatase: 48 U/L (ref 38–126)
Anion gap: 7 (ref 5–15)
BUN: 10 mg/dL (ref 6–20)
CO2: 25 mmol/L (ref 22–32)
Calcium: 8.9 mg/dL (ref 8.9–10.3)
Chloride: 103 mmol/L (ref 98–111)
Creatinine, Ser: 1.26 mg/dL — ABNORMAL HIGH (ref 0.61–1.24)
GFR, Estimated: >60 mL/min (ref >60)
Glucose, Bld: 106 mg/dL — ABNORMAL HIGH (ref 70–99)
Potassium: 3.9 mmol/L (ref 3.5–5.1)
Sodium: 135 mmol/L (ref 135–145)
Total Bilirubin: 0.4 mg/dL (ref 0.0–1.2)
Total Protein: 6.9 g/dL (ref 6.5–8.1)

## 2023-10-13 LAB — RESP PANEL BY RT-PCR (RSV, FLU A&B, COVID)  RVPGX2
Influenza A by PCR: NEGATIVE
Influenza B by PCR: NEGATIVE
Resp Syncytial Virus by PCR: NEGATIVE
SARS Coronavirus 2 by RT PCR: NEGATIVE

## 2023-10-13 LAB — TROPONIN I (HIGH SENSITIVITY): Troponin I (High Sensitivity): 2 ng/L (ref <18)

## 2023-10-13 NOTE — ED Notes (Signed)
 Pt. returned from XR.

## 2023-10-13 NOTE — ED Provider Notes (Signed)
 [facility] Provider Note    Event Date/Time   First MD Initiated Contact with Patient 10/13/23 [phone]     (approximate)   History   Hypertension and Chills   HPI  Jose George is a 53 y.o. male with history of NSVT, HIV, anxiety, hypertension presents emergency department complaining of elevated blood pressure, headache, some left side pain.  Chest pains been going on and off for several days but became very concerned last pressure doctor and has been started on a loop traumatic but has not picked it up and started it yet.  Unsure what medication that is.  States does take propranolol for anxiety and so he took a propranolol this morning along with an aspirin.  Denies shortness of breath.  No radiation of pain.  No sweating with the pain.  Had a cardiac cath in 2018 which was clear.  Does have history of elevated cholesterol. Pt also smokes 1 ppd x 32y      Physical Exam   Triage Vital Signs: ED Triage Vitals  Encounter Vitals Group     BP 10/13/23 0649 (!) 175/110     Systolic BP Percentile --      Diastolic BP Percentile --      Pulse Rate 10/13/23 0649 67     Resp 10/13/23 0649 16     Temp 10/13/23 0649 98.2 F (36.8 C)     Temp src --      SpO2 10/13/23 0649 98 %     Weight 10/13/23 0648 205 lb (93 kg)     Height 10/13/23 0648 5\' 11"  (1.803 m)     Head Circumference --      Peak Flow --      Pain Score 10/13/23 0648 4     Pain Loc --      Pain Education --      Exclude from Growth Chart --     Most recent vital signs: Vitals:   10/13/23 0732 10/13/23 0905  BP:  (!) 137/97  Pulse: 70 67  Resp:  18  Temp:    SpO2: 95% 95%     General: Awake, no distress.   CV:  Good peripheral perfusion. regular rate and  rhythm Resp:  Normal effort. Lungs CTA Abd:  No distention.   Other:     ED Results / Procedures / Treatments   Labs (all labs ordered are listed, but only abnormal results are displayed) Labs Reviewed  COMPREHENSIVE  METABOLIC PANEL WITH GFR - Abnormal; Notable for the following components:      Result Value   Glucose, Bld 106 (*)    Creatinine, Ser 1.26 (*)    All other components within normal limits  RESP PANEL BY RT-PCR (RSV, FLU A&B, COVID)  [id number]  CBC WITH DIFFERENTIAL/PLATELET  TROPONIN I (HIGH SENSITIVITY)     EKG  EKG   RADIOLOGY Chest x-ray    PROCEDURES:   Procedures Chief Complaint  Patient presents with   Hypertension   Chills      MEDICATIONS ORDERED IN ED: Medications - No data to display   IMPRESSION / MDM / ASSESSMENT AND PLAN / ED COURSE  I reviewed the triage vital signs and the nursing notes.                              Differential diagnosis includes, but is not limited to, NSTEMI, hypertension, angina, MI, STEMI, anxiety,  COVID, influenza, CAP  Patient's presentation is most consistent with acute illness / injury with system symptoms.   EKG, chest x-ray, labs ordered   EKG shows normal sinus rhythm, no STEMI noted, see physician read  Chest x-ray independently reviewed interpreted by me as being negative for acute abnormality  Patient's blood pressure has decreased to 146/96 here in the ED.  Troponin and metabolic panel reassuring  Did explain findings to patient.  Encouraged him to get his blood pressure medications start taking it.  While he is waiting on delivery of his medication he could take propranolol twice daily.  He is to follow-up with his regular doctor.  Return emergency department worsening.  He is in agreement with treatment plan.  Discharged stable condition.  FINAL CLINICAL IMPRESSION(S) / ED DIAGNOSES   Final diagnoses:  Uncontrolled hypertension  Nonspecific chest pain     Rx / DC Orders   ED Discharge Orders     None        Note:  This document was prepared using Dragon voice recognition software and may include unintentional dictation errors.    Faythe Ghee, PA-C 10/13/23 1330    Chesley Noon,  MD 10/13/23 [phone]

## 2023-10-13 NOTE — ED Triage Notes (Addendum)
 Pt reports he woke up this morning with a headache and chills, pt states he has a hx of HTN that is a somewhat new diagnosis but has not began to take his prescribed medication. Pt denies weakness or numbness in extremity speech clear. Pt reports he took propranolol and 1 500mg  aspirin PTA

## 2023-10-13 NOTE — ED Notes (Signed)
 Pt taken to XR.

## 2023-10-20 ENCOUNTER — Ambulatory Visit: Payer: Self-pay | Admitting: Licensed Clinical Social Worker

## 2023-10-20 DIAGNOSIS — F411 Generalized anxiety disorder: Secondary | ICD-10-CM

## 2023-10-20 NOTE — Progress Notes (Signed)
 Counselor/Therapist Progress Note  Patient ID: Jose George, MRN: [id number],    Date: 10/20/2023  Time Spent: 51 total minutes. I spent 46 minutes on real-time audio and video with the patient on the date of service. I spent an additional 5 minutes on pre- and post-visit activities on the date of service including collateral, chart review, team discussion, and documentation.     Treatment Type: Psychotherapy  Reported Symptoms:  Anxiety, anxious thoughts   Mental Status Exam:  Appearance:   Casual and Neat     Behavior:  Appropriate, Sharing, and Resistant  Motor:  Normal  Speech/Language:   Clear and Coherent and Normal Rate  Affect:  Appropriate, Congruent, and Full Range  Mood:  normal  Thought process:  normal  Thought content:    WNL  Sensory/Perceptual disturbances:    WNL  Orientation:  oriented to person, place, time/date, situation, and day of week  Attention:  Good  Concentration:  Good  Memory:  WNL  Fund of knowledge:   Good  Insight:    Fair  Judgment:   Fair  Impulse Control:  Fair   Risk Assessment: Danger to Self:  No Self-injurious Behavior: No Danger to Others: No Duty to Warn:no Physical Aggression / Violence:No  Access to Firearms a concern: No  Gang Involvement:No   Subjective: Patient reports a challenging week, mentioning a recent episode of high blood pressure and increased anxiety that led to a visit to the emergency department (ED). Medically, the patient was cleared, but this experience intensified his anxiety, which has decreased somewhat, though he remains concerned about the underlying cause of his elevated blood pressure. Patient shares progress in reducing smoking and vaping, and  declined to participate in mindfulness practice during the session. The session focused on discussing the patient's thoughts and feelings related to these concerns. Despite ongoing worries, the patient expressed continued motivation for  treatment.  Interventions: Cognitive Behavioral Therapy and Client Centered  LCSW established psychological safety and engaged the patient in discussing his current stressors, functioning, and emotional state. Assessment was conducted for signs of anxiety, depression, and overall safety. The LCSW validated the patient's concerns about his health and used Motivational Interviewing (MI) techniques to explore the patient's self-care habits, including eating, smoking/vaping, caffeine consumption, exercise, and sleep. MI was also used to assess the patient's readiness to reduce or quit smoking/vaping, and information was shared on available cessation options. The importance of regular eating habits was emphasized. The LCSW engaged the patient in discussing unresolved childhood issues that may contribute to his current challenges. The patient was invited to participate in a grounding or mindfulness practice, though declined, and was encouraged to try soothing practices over the next few weeks.  Diagnosis:   ICD-10-CM   1. Generalized anxiety disorder  F41.1       Plan: Patient's goal of treatment is dealing with emotions, feelings, past problems, and going forward and processing these experiences to deal with them in a rational way.    Treatment Target: Understand the relationship between thoughts, emotions, and behaviors  Psychoeducation on CBTs model   Oriented the client to the therapeutic approach Teach the connection between thoughts, emotions, and behaviors  Enhance emotional awareness and discrimination of emotions  Understand thoughts, emotions, and behaviors  Treatment Target: Increase realistic balanced thinking -to learn how to replace thinking with thoughts that are more accurate or helpful Explore patient's thoughts, beliefs, automatic thoughts, assumptions  Identify and replace unhelpful thinking patterns (upsetting ideas, self-talk and mental images)  Process distress and allow for  emotional release  Questioning and challenging thoughts Cognitive reappraisal  Restructuring, Socratic questioning  Provided psychoeducation on core beliefs, explore, and assist patient in identifying core beliefs and modify underlying beliefs Treatment Target: Creating a life worth living  Values clarification   Self-care - nutrition, sleep, exercise  Mindfulness skills Interpersonal Effectiveness  Emotion Regulation Distress Tolerance Treatment Target: Increase coping skills to manage panic and panic attacks  Intentional breathing exercises 5 senses Grounding exercises - holding ice cube, ice on forehead, washing hands in cold water Treatment Target: Increase psychological flexibility (Acceptance, defusion, observer self/perspective-taking, present moment awareness, values and committed action) Teach mindfulness skills  Psychoeducation about cognitive fusion- Hand as thoughts metaphor  Increase awareness of control vs. acceptance Reduce avoidance of certain emotions or situations Discuss "unhooking" and emphasize that thoughts and feelings do not always lead to action Increasing self-knowledge  Getting in touch with the observant self  Values-guided behavioral change  Future Appointments  Date Time Provider Department Center  11/03/2023  3:00 PM Nyle Belling, LCSW [facility] None     Nyle Belling, LCSW

## 2023-11-03 ENCOUNTER — Ambulatory Visit: Payer: Self-pay | Admitting: Licensed Clinical Social Worker

## 2023-11-03 DIAGNOSIS — F411 Generalized anxiety disorder: Secondary | ICD-10-CM

## 2023-11-03 NOTE — Progress Notes (Signed)
 Counselor/Therapist Progress Note  Patient ID: Jose George, MRN: [id number],    Date: 11/03/2023  Time Spent: 51 total minutes. I spent 46 minutes on real-time audio and video with the patient on the date of service. I spent an additional 5 minutes on pre- and post-visit activities on the date of service including collateral, chart review, team discussion, and documentation.    Treatment Type: Psychotherapy  Reported Symptoms:  continued anxiety, anxious thoughts, worries, no panic symptoms    Mental Status Exam:  Appearance:   Casual     Behavior:  Appropriate, Sharing, Resistant, and Motivated  Motor:  Normal  Speech/Language:   Clear and Coherent and Normal Rate  Affect:  Appropriate, Congruent, and Full Range  Mood:  normal  Thought process:  normal  Thought content:    WNL  Sensory/Perceptual disturbances:    WNL  Orientation:  oriented to person, place, time/date, situation, and day of week  Attention:  Good  Concentration:  Good  Memory:  WNL  Fund of knowledge:   Good  Insight:    Fair  Judgment:   Good  Impulse Control:  Good   Risk Assessment: Danger to Self:  No Self-injurious Behavior: No Danger to Others: No Duty to Warn:no Physical Aggression / Violence:No  Access to Firearms a concern: No  Gang Involvement:No   Subjective: The patient reports no significant distress since the previous session but continues to experience persistent, generalized worry about various aspects of his life. Patient was engaged throughout the session and utilized the time to express his thoughts and emotions. However, he appeared resistant to feedback and therapeutic interventions offered by the LCSW. He shared that he has attempted mindfulness exercises five times total but found them ineffective in managing his anxiety. The patient expressed difficulty in identifying effective coping strategies to reduce his worrying. He also reported past trials of medication, which he found  unhelpful and, in some cases, worsening his symptoms. Patient agreed to another session and voiced agreement with the plan to terminate services at the next session.   Interventions: Cognitive Behavioral Therapy and Client Centered  LCSW established psychological safety and created a supportive environment for open communication. Met with the patient to assess current stressors, overall functioning, and monitor for symptoms of anxiety and safety.  Checked in on the patient's well-being since the last session. Provided a space for the patient to process thoughts and emotions, responding with warmth and validation. Interventions included identifying and reflecting on unhelpful thought patterns and anxiety-related cognitions. Reviewed mindfulness meditation techniques, the STOP technique, Heart HUG, and other distraction or self-soothing strategies based on the patient's preferences. Addressed the patient's expressed "nothing seems to work," and discussed options for medication management or exploring alternative therapeutic modalities. Collaboratively discussed plans to review the treatment plan and proceed with case closure in the next session.  Diagnosis:   ICD-10-CM   1. Generalized anxiety disorder  F41.1      Plan: Patient's goal of treatment is dealing with emotions, feelings, past problems, and going forward and processing these experiences to deal with them in a rational way.    Treatment Target: Understand the relationship between thoughts, emotions, and behaviors  Psychoeducation on CBTs model   Oriented the client to the therapeutic approach Teach the connection between thoughts, emotions, and behaviors  Enhance emotional awareness and discrimination of emotions  Understand thoughts, emotions, and behaviors  Treatment Target: Increase realistic balanced thinking -to learn how to replace thinking with thoughts that are more accurate  or helpful Explore patient's thoughts, beliefs,  automatic thoughts, assumptions  Identify and replace unhelpful thinking patterns (upsetting ideas, self-talk and mental images) Process distress and allow for emotional release  Questioning and challenging thoughts Cognitive reappraisal  Restructuring, Socratic questioning  Provided psychoeducation on core beliefs, explore, and assist patient in identifying core beliefs and modify underlying beliefs Treatment Target: Creating a life worth living  Values clarification   Self-care - nutrition, sleep, exercise  Mindfulness skills Interpersonal Effectiveness  Emotion Regulation Distress Tolerance Treatment Target: Increase coping skills to manage panic and panic attacks  Intentional breathing exercises 5 senses Grounding exercises - holding ice cube, ice on forehead, washing hands in cold water Treatment Target: Increase psychological flexibility (Acceptance, defusion, observer self/perspective-taking, present moment awareness, values and committed action) Teach mindfulness skills  Psychoeducation about cognitive fusion- Hand as thoughts metaphor  Increase awareness of control vs. acceptance Reduce avoidance of certain emotions or situations Discuss "unhooking" and emphasize that thoughts and feelings do not always lead to action Increasing self-knowledge  Getting in touch with the observant self  Values-guided behavioral change  Future Appointments  Date Time Provider Department Center  11/17/2023  3:00 PM Nyle Belling, LCSW [facility] None     Nyle Belling, LCSW

## 2023-11-12 DIAGNOSIS — R000 Tachycardia, unspecified: Principal | ICD-10-CM

## 2023-11-12 DIAGNOSIS — E782 Mixed hyperlipidemia: Principal | ICD-10-CM

## 2023-11-12 DIAGNOSIS — I10 Essential (primary) hypertension: Principal | ICD-10-CM

## 2023-11-12 MED ORDER — AMLODIPINE 5 MG TABLET
ORAL_TABLET | Freq: Every day | ORAL | 3 refills | 90.00000 days | Status: CP
Start: 2023-11-12 — End: 2024-11-11
  Filled 2023-11-13: qty 30, 30d supply, fill #0

## 2023-11-12 MED ORDER — ROSUVASTATIN 10 MG TABLET
ORAL_TABLET | Freq: Every day | ORAL | 3 refills | 90.00000 days | Status: CP
Start: 2023-11-12 — End: 2024-11-06
  Filled 2023-11-13: qty 30, 30d supply, fill #0

## 2023-11-12 MED ORDER — PROPRANOLOL 10 MG TABLET
ORAL_TABLET | Freq: Two times a day (BID) | ORAL | 3 refills | 90.00000 days | Status: CN
Start: 2023-11-12 — End: ?

## 2023-11-16 DIAGNOSIS — Z21 Asymptomatic human immunodeficiency virus [HIV] infection status: Principal | ICD-10-CM

## 2023-11-16 MED ORDER — BICTEGRAVIR 50 MG-EMTRICITABINE 200 MG-TENOFOVIR ALAFENAM 25 MG TABLET
ORAL_TABLET | Freq: Every day | ORAL | 11 refills | 30.00000 days
Start: 2023-11-16 — End: ?

## 2023-11-17 ENCOUNTER — Ambulatory Visit: Payer: Self-pay | Admitting: Licensed Clinical Social Worker

## 2023-11-17 DIAGNOSIS — Z21 Asymptomatic human immunodeficiency virus [HIV] infection status: Principal | ICD-10-CM

## 2023-11-17 DIAGNOSIS — F411 Generalized anxiety disorder: Secondary | ICD-10-CM

## 2023-11-17 MED ORDER — BICTEGRAVIR 50 MG-EMTRICITABINE 200 MG-TENOFOVIR ALAFENAM 25 MG TABLET
ORAL_TABLET | Freq: Every day | ORAL | 11 refills | 30.00000 days | Status: CP
Start: 2023-11-17 — End: ?

## 2023-11-17 NOTE — Progress Notes (Unsigned)
 Counselor/Therapist Progress Note  Patient ID: Jose George, MRN: [id number],    Date: 11/18/2023  Time Spent: 50 total minutes. I spent on real-time audio and video with the patient on the date of service. I spent an additional 5 minutes on pre- and post-visit activities on the date of service including collateral, chart review, team discussion, and documentation.    Treatment Type: Psychotherapy  Reported Symptoms: Panic, anxiety, worries, sleep disturbance   Mental Status Exam:  Appearance:   Casual and Neat     Behavior:  Appropriate, Sharing, and Motivated  Motor:  Normal  Speech/Language:   Clear and Coherent and Normal Rate  Affect:  Appropriate, Congruent, and Full Range  Mood:  normal  Thought process:  normal  Thought content:    WNL  Sensory/Perceptual disturbances:    WNL  Orientation:  oriented to person, place, time/date, situation, and day of week  Attention:  Good  Concentration:  Good  Memory:  WNL  Fund of knowledge:   Good  Insight:    Fair  Judgment:   Good  Impulse Control:  Good   Risk Assessment: Danger to Self:  No Self-injurious Behavior: No Danger to Others: No Duty to Warn:no Physical Aggression / Violence:No  Access to Firearms a concern: No  Gang Involvement:No   Subjective: Patient shares that he was fired from his job two weeks ago and has felt distressed, anxious, and panicky. He shares that he is trying to keep positive thoughts and take care of himself the best he can. He also shares he has sought support from his friends and his sister. Patient was engaged and cooperative throughout the session using time to discuss thoughts, feelings, and treatment options. Patient scheduled follow up session.    Interventions: Cognitive Behavioral Therapy and Client Centered  LCSW established psychological safety. LCSW met with patient to identify needs related to stressors and functioning, and assess and monitor for signs and symptoms of  anxiety and depressed mood, and assess safety. Checked in with patient regarding how they have been doing since the last follow-up session. LCSW assisted patient in processing their thoughts and emotions regarding loss of job. Provided support through active listening, validation of feelings of anxiety and worry, and highlighted patient's strengths. LCSW supported patient in exploring ways to increase his intentional use of self care. Engaged patient in discussing termination of services and other treatment options-[facility]. LCSW and patient agreed that due to recent incident we will delay decision until next session.   Diagnosis:   ICD-10-CM   1. Generalized anxiety disorder  F41.1      Plan: Patient's goal of treatment is dealing with emotions, feelings, past problems, and going forward and processing these experiences to deal with them in a rational way.    Treatment Target: Understand the relationship between thoughts, emotions, and behaviors  Psychoeducation on CBTs model   Oriented the client to the therapeutic approach Teach the connection between thoughts, emotions, and behaviors  Enhance emotional awareness and discrimination of emotions  Understand thoughts, emotions, and behaviors  Treatment Target: Increase realistic balanced thinking -to learn how to replace thinking with thoughts that are more accurate or helpful Explore patient's thoughts, beliefs, automatic thoughts, assumptions  Identify and replace unhelpful thinking patterns (upsetting ideas, self-talk and mental images) Process distress and allow for emotional release  Questioning and challenging thoughts Cognitive reappraisal  Restructuring, Socratic questioning  Provided psychoeducation on core beliefs, explore, and assist patient in identifying core beliefs and modify underlying beliefs  Treatment Target: Creating a life worth living  Values clarification   Self-care - nutrition, sleep, exercise  Mindfulness  skills Interpersonal Effectiveness  Emotion Regulation Distress Tolerance Treatment Target: Increase coping skills to manage panic and panic attacks  Intentional breathing exercises 5 senses Grounding exercises - holding ice cube, ice on forehead, washing hands in cold water Treatment Target: Increase psychological flexibility (Acceptance, defusion, observer self/perspective-taking, present moment awareness, values and committed action) Teach mindfulness skills  Psychoeducation about cognitive fusion- Hand as thoughts metaphor  Increase awareness of control vs. acceptance Reduce avoidance of certain emotions or situations Discuss "unhooking" and emphasize that thoughts and feelings do not always lead to action Increasing self-knowledge  Getting in touch with the observant self  Values-guided behavioral change  Future Appointments  Date Time Provider Department Center  12/01/2023  3:00 PM Nyle Belling, LCSW [facility] None    Nyle Belling, LCSW

## 2023-11-18 NOTE — Unmapped (Signed)
 Patient completed [facility] application.. Eligible for [facility] B&C grant services and Caps on Charges. IPL= 0%, FPL= 0%. Expires 07/06/2024     [facility] Eligibility Form informing patient about RW services and Caps on charges was sent to patient via MyChart Message    [facility]/UMAP application: Application was submitted via [place] [facility] today.                        Cynthea Drier,  Benefits & Eligibility Coordinator  Time of Intervention: 2 minutes

## 2023-11-18 NOTE — Unmapped (Signed)
 [facility]    Patient Onboarding/Medication Counseling    30 day temp PAP, will likely have medicaid or UMAP for next fill    Timothy Castro is a 53 y.o. male with HIV who I am counseling today on continuation of therapy.  I am speaking to the patient.    Was a Nurse, learning disability used for this call? No    Verified patient's date of birth / HIPAA.    Specialty medication(s) to be sent: Infectious Disease: Biktarvy      Non-specialty medications/supplies to be sent: n/a      Medications not needed at this time: n/a         Biktarvy (bictegravir, emtricitabine, and tenofovir alafenamide)  The patient declined counseling on medication administration, missed dose instructions, goals of therapy, side effects and monitoring parameters, warnings and precautions, drug/food interactions, and storage, handling precautions, and disposal because they have taken the medication previously. The information in the declined sections below are for informational purposes only and was not discussed with patient.       Medication & Administration     Dosage: Take 1 tablet by mouth daily    Administration: Take without regard to food    Adherence/Missed dose instructions: take missed dose as soon as you remember. If it is close to the time of your next dose, skip the dose and resume with your next scheduled dose.    Goals of Therapy     To suppress viral replication and keep patient's HIV undetectable by lab tests    Side Effects & Monitoring Parameters     Common Side Effects:  Diarrhea  Upset stomach  Headache  Changes in Weight  Changes in mood    The following side effects should be reported to the provider:     If patient experiences: signs of an allergic reaction (rash; hives; itching; red, swollen, blistered, or peeling skin with or without fever; wheezing; tightness in the chest or throat; trouble breathing, swallowing, or talking; unusual hoarseness; or swelling of the mouth, face, lips, tongue, or throat)  signs of kidney problems (unable to pass urine, change in how much urine is passed, blood in the urine, or a big weight gain)  signs of liver problems (dark urine, feeling tired, not hungry, upset stomach or stomach pain, light-colored stools, throwing up, or yellow skin or eyes)  signs of lactic acidosis (fast breathing, fast heartbeat, a heartbeat that does not feel normal, very bad upset stomach or throwing up, feeling very sleepy, shortness of breath, feeling very tired or weak, very bad dizziness, feeling cold, or muscle pain or cramps)  Weight gain: some patients have reported weight gain after starting this medication. The amount of weight can vary.    Monitoring Parameters:  CD4  Count  HIV RNA plasma levels,  Liver function  Total bilirubin  serum creatinine  urine glucose  urine protein (prior to or when initiating therapy and as clinically indicated during therapy);       Drug/Food Interactions     Medication list reviewed in Epic. The patient was instructed to inform the care team before taking any new medications or supplements. No drug interactions identified.   Calcium Salts: May decrease the serum concentration of Biktarvy. If taken with food, Biktarvy can be administered with calcium salts.   Iron Preparations: May decrease the serum concentration of Biktarvy. If taken with food, Biktarvy can be administered with Ferrous sulfate. If taken on an empty stomach, Biktarvy must  be taken 2 hours before ferrous sulfate. Avoid other iron salts.    Contraindications, Warnings, & Precautions     Black Box Warning: Severe acute exacertbations of HBV have been reported in patients coinfected with HIV-1 and HBV fllowing discontinuation of therapy  Coadministration with dofetilide, rifampin is contraindicated  Immune reconstitution syndrome: Patients may develop immune reconstitution syndrome, resulting in the occurrence of an inflammatory response to an indolent or residual opportunistic infection or activation of autoimmune disorders (eg, Graves disease, polymyositis, Guillain-Barr?? syndrome, autoimmune hepatitis)   Lactic acidosis/hepatomegaly  Renal toxicity: patients with preexisting renal impairment and those taking nephrotoxic agents (including NSAIDs) are at increased risk.     Storage, Handling Precautions, & Disposal     Store in the original container at room temperature.   Keep lid tightly closed.   Store in a dry place. Do not store in a bathroom.   Keep all drugs in a safe place. Keep all drugs out of the reach of children and pets.   Throw away unused or expired drugs. Do not flush down a toilet or pour down a drain unless you are told to do so. Check with your pharmacist if you have questions about the best way to throw out drugs. There may be drug take-back programs in your area.    HIV ASSOCIATED LABS:     Lab Results   Component Value Date/Time    HIVRS Not Detected 08/19/2023 08:59 AM    HIVRS Not Detected 11/19/2022 03:39 PM    HIVRS Not Detected 06/04/2022 02:57 PM    HIVRS Not Detected 06/21/2014 04:29 PM    HIVRS Not Detected 01/11/2014 04:30 PM    HIVRS Not Detected 09/14/2013 04:11 PM    HIVCP <40 (H) 12/23/2017 04:49 PM    HIVCP 49536 06/08/2013 04:34 PM    ACD4 1,386 12/04/2021 11:10 AM    ACD4 1,320 11/23/2019 10:24 AM    ACD4 760 12/05/2015 02:14 PM    ACD4 672 06/21/2014 04:29 PM    ACD4 581 09/14/2013 04:11 PM    ACD4 350 (L) 06/08/2013 04:34 PM         Current Medications (including OTC/herbals), Comorbidities and Allergies     Current Outpatient Medications   Medication Sig Dispense Refill    amlodipine (NORVASC) 5 MG tablet Take 1 tablet (5 mg total) by mouth daily. 90 tablet 3    bictegrav-emtricit-tenofov ala (BIKTARVY) 50-200-25 mg tablet Take 1 tablet by mouth daily. 30 tablet 11    cetirizine (ZYRTEC) 10 MG tablet Take 1 tablet (10 mg total) by mouth daily. (Patient not taking: Reported on 06/04/2022)      doxycycline (MONODOX) 100 MG capsule Take 2 capsules (200 mg total) by mouth once as needed (within 72 hours (3 days) after sex) for up to 1 dose. Take within 72 hours (3 days) after sex, ideally within 24 hours. Take no more than 200 mg in a 24 hour period. 30 capsule 0    fluticasone propionate (FLONASE) 50 mcg/actuation nasal spray 1 spray into each nostril daily. (Patient not taking: Reported on 08/19/2023) 16 g 1    propranolol (INDERAL) 10 MG tablet TAKE 1 TABLET BY MOUTH TWO TIMES A DAY. 180 tablet 3    rosuvastatin (CRESTOR) 10 MG tablet Take 1 tablet (10 mg total) by mouth daily. 90 tablet 3     No current facility-administered medications for this visit.       No Known Allergies    Patient Active Problem List  Diagnosis    HIV (human immunodeficiency virus infection)      Anal dysplasia    Tobacco use disorder    PVC (premature ventricular contraction)    Gastroesophageal reflux disease without esophagitis    Anxiety    Diabetes mellitus screening    Elevated BP without diagnosis of hypertension    Hypercholesteremia    Colitis    Generalized anxiety disorder       Medication list has been reviewed and updated in Epic: Yes    Allergies have been reviewed and updated in Epic: Yes    Appropriateness of Therapy     Acute infections noted within Epic:  No active infections  Patient reported infection: None    Is the medication and dose appropriate based on diagnosis, medication list, comorbidities, allergies, medical history, patient???s ability to self-administer the medication, and therapeutic goals? Yes    Prescription has been clinically reviewed: Yes      Baseline Quality of Life Assessment      How many days over the past month did your HIV  keep you from your normal activities? For example, brushing your teeth or getting up in the morning. Patient declined to answer    Financial Information     Medication Assistance provided: [employer] (TEMP PAP)    Anticipated copay of $0 reviewed with patient. Verified delivery address.    Delivery Information     Scheduled delivery date: 11/20/23    Expected start date: ~11/21/23 (has 3-4 left from previous fill)      Medication will be delivered via Next Day Courier to the prescription address in [facility].  This shipment will not require a signature.      Explained the services we provide at [facility] and that each month we would call to set up refills.  Stressed importance of returning phone calls so that we could ensure they receive their medications in time each month.  Informed patient that we should be setting up refills 7-10 days prior to when they will run out of medication.  A pharmacist will reach out to perform a clinical assessment periodically.  Informed patient that a welcome packet, containing information about our pharmacy and other support services, a Notice of Privacy Practices, and a drug information handout will be sent.      The patient or caregiver noted above participated in the development of this care plan and knows that they can request review of or adjustments to the care plan at any time.      Patient or caregiver verbalized understanding of the above information as well as how to contact the pharmacy at [phone] option 4 with any questions/concerns.  The pharmacy is open Monday through Friday 8:30am-4:30pm.  A pharmacist is available 24/7 via pager to answer any clinical questions they may have.    Patient Specific Needs     Does the patient have any physical, cognitive, or cultural barriers? No    Does the patient have adequate living arrangements? (i.e. the ability to store and take their medication appropriately) Yes    Did you identify any home environmental safety or security hazards? No    Patient prefers to have medications discussed with  Patient     Is the patient or caregiver able to read and understand education materials at a high school level or above? Yes    Patient's primary language is  English     Is the patient high risk? No  Does the patient have an additional or emergency contact listed in their chart? No, patient refused.    SOCIAL DETERMINANTS OF HEALTH     At the [facility], we have learned that life circumstances - like trouble affording food, housing, utilities, or transportation can affect the health of many of our patients.   That is why we wanted to ask: are you currently experiencing any life circumstances that are negatively impacting your health and/or quality of life? Patient declined to answer    Social Drivers of Health     Food Insecurity: No Food Insecurity (09/04/2021)    Hunger Vital Sign     Worried About Running Out of Food in the Last Year: Never true     Ran Out of Food in the Last Year: Never true   Tobacco Use: High Risk (10/13/2023)    Received from [facility]    Patient History     Smoking Tobacco Use: Every Day     Smokeless Tobacco Use: Never     Passive Exposure: Not on file   Transportation Needs: No Transportation Needs (09/04/2021)    PRAPARE - Transportation     Lack of Transportation (Medical): No     Lack of Transportation (Non-Medical): No   Alcohol Use: Not At Risk (09/04/2021)    Alcohol Use     How often do you have a drink containing alcohol?: Never     How many drinks containing alcohol do you have on a typical day when you are drinking?: 1 - 2     How often do you have 5 or more drinks on one occasion?: Never   Housing: Not on file   Physical Activity: Not on file   Utilities: Not on file   Stress: Not on file   Interpersonal Safety: Not on file   Substance Use: Not on file (05/13/2023)   Intimate Partner Violence: Not At Risk (04/10/2021)    Received from [facility]    Humiliation, Afraid, Rape, and Kick questionnaire     Fear of Current or Ex-Partner: No     Emotionally Abused: No     Physically Abused: No     Sexually Abused: No   Social Connections: Not on file   Financial Resource Strain: Low Risk  (09/04/2021)    Overall Financial Resource Strain (CARDIA)     Difficulty of Paying Living Expenses: Not hard at all   Health Literacy: Not on file   Internet Connectivity: Not on file       Would you be willing to receive help with any of the needs that you have identified today? Not applicable       Riesa Chary, PharmD  [facility] Specialty Pharmacist

## 2023-11-19 DIAGNOSIS — Z21 Asymptomatic human immunodeficiency virus [HIV] infection status: Principal | ICD-10-CM

## 2023-11-19 MED ORDER — BICTEGRAVIR 50 MG-EMTRICITABINE 200 MG-TENOFOVIR ALAFENAM 25 MG TABLET
ORAL_TABLET | Freq: Every day | ORAL | 4 refills | 30.00000 days | Status: CP
Start: 2023-11-19 — End: ?
  Filled 2023-11-19: qty 30, 30d supply, fill #0

## 2023-11-19 NOTE — Unmapped (Signed)
 Medication Requested: biktarvy  Just approved for [facility]      Future Appointments   Date Time Provider Department Center   02/17/2024  8:00 AM Kurtis Philips, Loralyn Rochester, MD [facility]     Per Provider Note: Overall doing well. He rarely misses doses of Biktarvy     Standing order protocol requirements met?: Yes    Sent to: Pharmacy per protocol    Days Supply Given: 30 days  Number of Refills: 4

## 2023-11-19 NOTE — Unmapped (Signed)
 Specialty Medication(s): Maryruth Sol    Mr.Supinski has been dis-enrolled from the [facility] specialty pharmacy services as a result of a pharmacy change resulting from insurance limitations. The insurance company requires the patient fill at [employer], [phone], [facility].    Additional information provided to the patient: patient approved for UMAP    Riesa Chary, PharmD  [facility] Specialty Pharmacist

## 2023-11-19 NOTE — Unmapped (Signed)
 Patient has been approved for UMAP.  Attempted to contact the patient by phone to share the update, but there was no answer. A discreet voicemail was left informing him of the approval.  A MyChart message was also sent, providing details of the UMAP approval and outlining next steps.  Nursing staff were notified of the approval and asked to send the patient???s prescription to [facility] [phone]).                   Cynthea Drier,  Benefits & Eligibility Coordinator  Time of Intervention: 2 minutes

## 2023-12-01 ENCOUNTER — Ambulatory Visit: Payer: Self-pay | Admitting: Licensed Clinical Social Worker

## 2023-12-01 DIAGNOSIS — F411 Generalized anxiety disorder: Secondary | ICD-10-CM

## 2023-12-01 NOTE — Progress Notes (Signed)
 Counselor/Therapist Progress Note  Patient ID: Robyn Nohr, MRN: [id number],    Date: 12/01/2023  Time Spent: 55 total minutes. I spent 45 minutes on real-time audio and video with the patient on the date of service. I spent an additional 5 minutes on pre- and post-visit activities on the date of service including collateral, chart review, team discussion, and documentation.    Treatment Type: Psychotherapy  Reported Symptoms: Anxiety, anxious thoughts, worries, distractibility; low mood   Mental Status Exam:  Appearance:   Casual and Neat     Behavior:  Appropriate, Sharing, and Motivated  Motor:  Normal  Speech/Language:   Clear and Coherent and Normal Rate  Affect:  Appropriate, Congruent, and Full Range  Mood:  normal  Thought process:  normal  Thought content:    WNL  Sensory/Perceptual disturbances:    WNL  Orientation:  oriented to person, place, time/date, and situation  Attention:  Good  Concentration:  Good  Memory:  WNL  Fund of knowledge:   Good  Insight:    Fair  Judgment:   Fair  Impulse Control:  Good   Risk Assessment: Danger to Self:  No Self-injurious Behavior: No Danger to Others: No Duty to Warn:no Physical Aggression / Violence:No  Access to Firearms a concern: No  Gang Involvement:No   Subjective: Patient reports feeling bored and expresses concern about not having a job and the resulting financial stress, including worries about how he will pay his bills. He described feeling discouraged when submitting job applications and not receiving responses. Despite these challenges, the patient shared that he has been utilizing coping strategies such as distraction, cognitive reframing, and balanced thinking to help manage his anxious thoughts. He was engaged and cooperative throughout the session, using the time to process thoughts and emotions related to unemployment. The patient noted that he has not yet connected with a new treatment provider but  expressed agreement with the plan to transition care. He voiced appreciation for the therapeutic space and reported finding it helpful for processing his experiences.  Interventions: Cognitive Behavioral Therapy and Client Centered  LCSW established psychological safety. LCSW met with patient to identify needs related to stressors and functioning, and assess and monitor for signs and symptoms of anxiety and depressed mood, and assess safety. Checked in with patient regarding how they have been doing since the last follow-up session. LCSW provided a supportive environment for the patient to explore and process thoughts and emotions related to unemployment and associated stressors. Support was offered through active listening, validation of the patient's experiences, and by highlighting personal strengths and coping efforts. LCSW revisited the prior discussion regarding transitioning care, noting the limited progress in reducing anxiety symptoms and a misalignment between the patient's needs and the therapeutic approaches offered. Information about [facility] was reviewed, and the patient was encouraged to contact his Medicaid care management organization to explore additional treatment options and resources. Patient was encouraged to reach out to LCSW if he needed any further assistance.   Diagnosis:   ICD-10-CM   1. Generalized anxiety disorder  F41.1      Plan: Patient to transition care.    Patient's goal of treatment is dealing with emotions, feelings, past problems, and going forward and processing these experiences to deal with them in a rational way.    Treatment Target: Understand the relationship between thoughts, emotions, and behaviors  Psychoeducation on CBTs model   Oriented the client to the therapeutic approach Teach the connection between thoughts, emotions, and behaviors  Enhance emotional awareness and discrimination of emotions  Understand thoughts, emotions, and behaviors  Completed   Treatment Target: Increase realistic balanced thinking -to learn how to replace thinking with thoughts that are more accurate or helpful Explore patient's thoughts, beliefs, automatic thoughts, assumptions  Identify and replace unhelpful thinking patterns (upsetting ideas, self-talk and mental images) Process distress and allow for emotional release  Questioning and challenging thoughts Cognitive reappraisal  Restructuring, Socratic questioning  Provided psychoeducation on core beliefs, explore, and assist patient in identifying core beliefs and modify underlying beliefs  Completed   Treatment Target: Creating a life worth living  Values clarification   Self-care - nutrition, sleep, exercise  Mindfulness skills Interpersonal Effectiveness  Emotion Regulation Distress Tolerance Unmet  Treatment Target: Increase coping skills to manage panic and panic attacks  Intentional breathing exercises 5 senses Grounding exercises - holding ice cube, ice on forehead, washing hands in cold water  Unmet  Treatment Target: Increase psychological flexibility (Acceptance, defusion, observer self/perspective-taking, present moment awareness, values and committed action) Teach mindfulness skills  Psychoeducation about cognitive fusion- Hand as thoughts metaphor  Increase awareness of control vs. acceptance Reduce avoidance of certain emotions or situations Discuss "unhooking" and emphasize that thoughts and feelings do not always lead to action Increasing self-knowledge  Getting in touch with the observant self  Values-guided behavioral change Unmet  No future appointments.   Nyle Belling, LCSW

## 2024-02-08 DIAGNOSIS — Z9189 Other specified personal risk factors, not elsewhere classified: Principal | ICD-10-CM

## 2024-02-08 DIAGNOSIS — R000 Tachycardia, unspecified: Principal | ICD-10-CM

## 2024-02-08 MED ORDER — DOXYCYCLINE MONOHYDRATE 100 MG CAPSULE
ORAL_CAPSULE | Freq: Once | ORAL | 0 refills | 0.00000 days | PRN
Start: 2024-02-08 — End: ?

## 2024-02-08 MED ORDER — PROPRANOLOL 10 MG TABLET
ORAL_TABLET | Freq: Two times a day (BID) | ORAL | 3 refills | 90.00000 days
Start: 2024-02-08 — End: ?

## 2024-02-09 MED ORDER — PROPRANOLOL 10 MG TABLET
ORAL_TABLET | Freq: Two times a day (BID) | ORAL | 3 refills | 90.00000 days | Status: CP
Start: 2024-02-09 — End: ?
  Filled 2024-03-01: qty 180, 90d supply, fill #0

## 2024-02-10 MED ORDER — DOXYCYCLINE MONOHYDRATE 100 MG CAPSULE
ORAL_CAPSULE | Freq: Once | ORAL | 0 refills | 0.00000 days | Status: CP | PRN
Start: 2024-02-10 — End: ?
  Filled 2024-03-01: qty 30, 15d supply, fill #0

## 2024-02-16 MED ORDER — DOXYCYCLINE MONOHYDRATE 100 MG CAPSULE
ORAL_CAPSULE | Freq: Once | ORAL | 0 refills | 0.00000 days | PRN
Start: 2024-02-16 — End: ?

## 2024-02-26 DIAGNOSIS — N486 Induration penis plastica: Principal | ICD-10-CM

## 2024-04-27 ENCOUNTER — Encounter: Admit: 2024-04-27 | Discharge: 2024-04-27 | Payer: Medicaid (Managed Care)

## 2024-04-27 DIAGNOSIS — Z79899 Other long term (current) drug therapy: Principal | ICD-10-CM

## 2024-04-27 DIAGNOSIS — Z1159 Encounter for screening for other viral diseases: Principal | ICD-10-CM

## 2024-04-27 DIAGNOSIS — F17200 Nicotine dependence, unspecified, uncomplicated: Principal | ICD-10-CM

## 2024-04-27 DIAGNOSIS — B20 Human immunodeficiency virus [HIV] disease: Principal | ICD-10-CM

## 2024-04-27 DIAGNOSIS — Z23 Encounter for immunization: Principal | ICD-10-CM

## 2024-04-27 DIAGNOSIS — Z1211 Encounter for screening for malignant neoplasm of colon: Principal | ICD-10-CM

## 2024-04-27 DIAGNOSIS — Z8619 Personal history of other infectious and parasitic diseases: Principal | ICD-10-CM

## 2024-04-27 DIAGNOSIS — Z5181 Encounter for therapeutic drug level monitoring: Principal | ICD-10-CM

## 2024-04-27 DIAGNOSIS — Z21 Asymptomatic human immunodeficiency virus [HIV] infection status: Principal | ICD-10-CM

## 2024-04-27 DIAGNOSIS — Z113 Encounter for screening for infections with a predominantly sexual mode of transmission: Principal | ICD-10-CM

## 2024-04-27 DIAGNOSIS — Z9189 Other specified personal risk factors, not elsewhere classified: Principal | ICD-10-CM

## 2024-04-27 MED ORDER — DOXYCYCLINE MONOHYDRATE 100 MG CAPSULE
ORAL_CAPSULE | Freq: Once | ORAL | 0 refills | 15.00000 days | Status: CP | PRN
Start: 2024-04-27 — End: ?

## 2024-04-27 MED ORDER — BICTEGRAVIR 50 MG-EMTRICITABINE 200 MG-TENOFOVIR ALAFENAM 25 MG TABLET
ORAL_TABLET | Freq: Every day | ORAL | 4 refills | 30.00000 days | Status: CP
Start: 2024-04-27 — End: ?

## 2024-07-11 ENCOUNTER — Ambulatory Visit: Admit: 2024-07-11 | Discharge: 2024-07-12 | Payer: Medicaid (Managed Care)

## 2024-07-11 ENCOUNTER — Encounter
Admit: 2024-07-11 | Discharge: 2024-07-12 | Payer: Medicaid (Managed Care) | Attending: Student in an Organized Health Care Education/Training Program | Primary: Student in an Organized Health Care Education/Training Program

## 2024-07-11 DIAGNOSIS — I2510 Atherosclerotic heart disease of native coronary artery without angina pectoris: Principal | ICD-10-CM

## 2024-07-11 DIAGNOSIS — I10 Essential (primary) hypertension: Principal | ICD-10-CM

## 2024-07-11 DIAGNOSIS — R0789 Other chest pain: Principal | ICD-10-CM

## 2024-07-11 DIAGNOSIS — I2584 Coronary atherosclerosis due to calcified coronary lesion: Principal | ICD-10-CM

## 2024-07-11 MED ORDER — METOPROLOL TARTRATE 50 MG TABLET
ORAL_TABLET | ORAL | 0 refills | 0.00000 days | Status: CP
Start: 2024-07-11 — End: ?

## 2024-07-21 ENCOUNTER — Inpatient Hospital Stay: Admit: 2024-07-21 | Discharge: 2024-07-21 | Payer: Medicaid (Managed Care)

## 2024-07-22 DIAGNOSIS — I2584 Coronary atherosclerosis due to calcified coronary lesion: Principal | ICD-10-CM

## 2024-07-22 DIAGNOSIS — I2510 Atherosclerotic heart disease of native coronary artery without angina pectoris: Principal | ICD-10-CM

## 2024-07-27 ENCOUNTER — Inpatient Hospital Stay: Admit: 2024-07-27 | Discharge: 2024-07-28 | Payer: Medicaid (Managed Care)

## 2024-07-27 MED FILL — PROPRANOLOL 10 MG TABLET: ORAL | 90 days supply | Qty: 180 | Fill #1

## 2024-08-05 DIAGNOSIS — Z9189 Other specified personal risk factors, not elsewhere classified: Principal | ICD-10-CM

## 2024-08-05 MED ORDER — DOXYCYCLINE MONOHYDRATE 100 MG CAPSULE
ORAL_CAPSULE | Freq: Once | ORAL | 0 refills | 15.00000 days | Status: CP | PRN
Start: 2024-08-05 — End: ?
  Filled 2024-08-10: qty 30, 15d supply, fill #0
